# Patient Record
Sex: Female | Born: 1940
Health system: Southern US, Community
[De-identification: ages and names within clinical notes are randomized; demographics above are authoritative.]

## PROBLEM LIST (undated history)

## (undated) DIAGNOSIS — Z85828 Personal history of other malignant neoplasm of skin: Secondary | ICD-10-CM

## (undated) DIAGNOSIS — E785 Hyperlipidemia, unspecified: Secondary | ICD-10-CM

## (undated) DIAGNOSIS — U071 COVID-19: Secondary | ICD-10-CM

## (undated) DIAGNOSIS — K219 Gastro-esophageal reflux disease without esophagitis: Secondary | ICD-10-CM

## (undated) DIAGNOSIS — K649 Unspecified hemorrhoids: Secondary | ICD-10-CM

## (undated) DIAGNOSIS — K449 Diaphragmatic hernia without obstruction or gangrene: Secondary | ICD-10-CM

## (undated) DIAGNOSIS — J189 Pneumonia, unspecified organism: Secondary | ICD-10-CM

## (undated) DIAGNOSIS — L719 Rosacea, unspecified: Secondary | ICD-10-CM

## (undated) DIAGNOSIS — G4700 Insomnia, unspecified: Secondary | ICD-10-CM

## (undated) DIAGNOSIS — I10 Essential (primary) hypertension: Secondary | ICD-10-CM

## (undated) DIAGNOSIS — I2510 Atherosclerotic heart disease of native coronary artery without angina pectoris: Secondary | ICD-10-CM

## (undated) DIAGNOSIS — F419 Anxiety disorder, unspecified: Secondary | ICD-10-CM

## (undated) DIAGNOSIS — C801 Malignant (primary) neoplasm, unspecified: Secondary | ICD-10-CM

## (undated) DIAGNOSIS — R011 Cardiac murmur, unspecified: Secondary | ICD-10-CM

## (undated) DIAGNOSIS — K760 Fatty (change of) liver, not elsewhere classified: Secondary | ICD-10-CM

## (undated) HISTORY — DX: Malignant (primary) neoplasm, unspecified: C80.1

## (undated) HISTORY — DX: Diaphragmatic hernia without obstruction or gangrene: K44.9

## (undated) HISTORY — DX: Anxiety disorder, unspecified: F41.9

## (undated) HISTORY — DX: Fatty (change of) liver, not elsewhere classified: K76.0

## (undated) HISTORY — DX: Essential (primary) hypertension: I10

## (undated) HISTORY — PX: BASAL CELL CARCINOMA EXCISION: SHX1214

## (undated) HISTORY — DX: Personal history of other malignant neoplasm of skin: Z85.828

## (undated) HISTORY — DX: Cardiac murmur, unspecified: R01.1

## (undated) HISTORY — DX: Hyperlipidemia, unspecified: E78.5

## (undated) HISTORY — DX: Gastro-esophageal reflux disease without esophagitis: K21.9

## (undated) HISTORY — PX: MOHS SURGERY: SUR867

## (undated) HISTORY — DX: COVID-19: U07.1

## (undated) HISTORY — DX: Insomnia, unspecified: G47.00

## (undated) HISTORY — DX: Atherosclerotic heart disease of native coronary artery without angina pectoris: I25.10

## (undated) HISTORY — DX: Unspecified hemorrhoids: K64.9

## (undated) HISTORY — DX: Rosacea, unspecified: L71.9

## (undated) HISTORY — PX: APPENDECTOMY: SHX54

---

## 1979-08-29 HISTORY — PX: TUBAL LIGATION: SHX77

## 2005-09-18 ENCOUNTER — Ambulatory Visit: Payer: Self-pay | Admitting: Gastroenterology

## 2005-09-28 LAB — HM COLONOSCOPY

## 2005-10-06 ENCOUNTER — Ambulatory Visit: Payer: Self-pay | Admitting: Gastroenterology

## 2005-10-06 ENCOUNTER — Encounter (INDEPENDENT_AMBULATORY_CARE_PROVIDER_SITE_OTHER): Payer: Self-pay | Admitting: Specialist

## 2007-10-08 LAB — HM PAP SMEAR: HM Pap smear: NORMAL

## 2009-08-28 LAB — HM MAMMOGRAPHY: HM Mammogram: NORMAL

## 2009-08-28 LAB — HM DEXA SCAN

## 2010-04-08 ENCOUNTER — Encounter: Admission: RE | Admit: 2010-04-08 | Discharge: 2010-04-08 | Payer: Self-pay | Admitting: Family Medicine

## 2011-01-18 ENCOUNTER — Encounter: Payer: Self-pay | Admitting: Gastroenterology

## 2011-01-18 ENCOUNTER — Ambulatory Visit (INDEPENDENT_AMBULATORY_CARE_PROVIDER_SITE_OTHER): Payer: Medicare Other | Admitting: Gastroenterology

## 2011-01-18 VITALS — BP 128/64 | HR 60 | Ht 63.0 in | Wt 181.0 lb

## 2011-01-18 DIAGNOSIS — R102 Pelvic and perineal pain: Secondary | ICD-10-CM

## 2011-01-18 DIAGNOSIS — N949 Unspecified condition associated with female genital organs and menstrual cycle: Secondary | ICD-10-CM

## 2011-01-18 NOTE — Progress Notes (Signed)
Review of pertinent gastrointestinal problems: 1. routine risk for colon cancer. Colonoscopy by Dr. Christella Hartigan 2007 with small hyperplastic polyps removed. Recall colonoscopy at 10 year interval.   HPI: This is a  very pleasant 70 year old woman whom I last saw at the time of her screening colonoscopy 5 years ago.  She has been . Has has been having left pelvis swelling, pain.  She moves and hears a "squishy feeling" in her left abd.  Can have trouble with constipation, trouble with urinating. This is intermittent.  Will change her diet to liquids, and symptoms improve.  In August 2011 she had these symptoms, no fevers or chills.  Felt "bad" "so bad,"  During these symptoms her symptoms can be black.  Does not take peptobismal.   In August 2011 she was put on antibiotics for "diverticulitis" however no tics ever seen on colonoscopy or by CT scan done at that time.  The pain is clearly positional, can be related to hip movement. She sometimes feels swelling in the left pelvis. She can hear a whooshing sound at times during the pain.  Overall stable weight.  No overt red bleeding, but + dark stools.  No FH of colon cancer (no first degree relatives at least).    Review of systems: Pertinent positive and negative review of systems were noted in the above HPI section.  All other review of systems was otherwise negative.   Past Medical History, Past Surgical History, Family History, Social History, Current Medications, Allergies were all reviewed with the patient via [facility] electronic medical record system.   Physical Exam: BP 128/64  Pulse 60  Ht 5\' 3"  (1.6 m)  Wt 181 lb (82.101 kg)  BMI 32.06 kg/m2 Constitutional: generally well-appearing Psychiatric: alert and oriented x3 Eyes: extraocular movements intact Mouth: oral pharynx moist, no lesions Neck: supple no lymphadenopathy Cardiovascular: heart regular rate and rhythm Lungs: clear to auscultation bilaterally Abdomen: soft,  nontender, nondistended, no obvious ascites, no peritoneal signs, normal bowel sounds Extremities: no lower extremity edema bilaterally Skin: no lesions on visible extremities She is very mildly tender in her left pelvis, I do not detect any clear hernia defects.    Assessment and plan: 70 y.o. female with left pelvic discomfort  I do not think that her symptoms are bowel, diverticular related. She had no diverticulosis noted on CT scan 2011 and no diverticulosis noted on colonoscopy 2007. Her pain is actually low in her left pelvis, clearly positional related. I did not take the hernia at that site but this certainly could be a small hernia. I am going to send her to Gen. surgery to evaluate for left-sided inguinal hernia. Perhaps it is just too small for me to detect. If that evaluation is negative then I suggest further musculoskeletal workup putting hip x-rays, perhaps spine x-ray.

## 2011-01-18 NOTE — Patient Instructions (Signed)
Referral to general surgery for low left abd pain, ?hernia. If no hernia detected, then should consider left hip, spine problem. No diverticulosis seen on 2007 colonoscopy or 2011 CT scan abd/pelvis. A copy of this information will be made available to Dr. Tanya Nones.

## 2011-01-24 ENCOUNTER — Encounter: Payer: Self-pay | Admitting: Gastroenterology

## 2011-02-20 ENCOUNTER — Encounter (INDEPENDENT_AMBULATORY_CARE_PROVIDER_SITE_OTHER): Payer: Self-pay | Admitting: General Surgery

## 2011-02-20 ENCOUNTER — Ambulatory Visit (INDEPENDENT_AMBULATORY_CARE_PROVIDER_SITE_OTHER): Payer: Self-pay | Admitting: General Surgery

## 2011-02-20 ENCOUNTER — Ambulatory Visit (INDEPENDENT_AMBULATORY_CARE_PROVIDER_SITE_OTHER): Payer: Medicare Other | Admitting: General Surgery

## 2011-02-20 VITALS — BP 130/62 | HR 78 | Ht 63.0 in | Wt 180.0 lb

## 2011-02-20 DIAGNOSIS — R1032 Left lower quadrant pain: Secondary | ICD-10-CM | POA: Insufficient documentation

## 2011-02-20 HISTORY — DX: Left lower quadrant pain: R10.32

## 2011-02-20 NOTE — Progress Notes (Signed)
Subjective:     Patient ID: Sharon Mcclure, female   DOB: [birth year], 70 y.o.   MRN: [id number]    There were no vitals taken for this visit.    HPIThis is a 70 year old female who is referred by Dr. Wendall Papa. She has about a 2 year history of what she describes as a left lower quadrant pain and discomfort that is always there. She works as a Interior and spatial designer. She  notes that this is worse with bending and lifting. It is not made much worse by activity. He has never they are in beginning of the morning and it always occurs after she has activity. She reports that she feels a gushing sound associated with this. She does not note any bulge in this area at all. She reports no changes in her bowel movements. She reports no nausea or vomiting associated with this.   Review of Systems  Constitutional: Negative.   Respiratory: Negative.   Cardiovascular: Negative.   Gastrointestinal: Negative.   Musculoskeletal: Positive for arthralgias (left hip).       Objective:   Physical Exam  Constitutional: She appears well-developed and well-nourished. No distress.  Cardiovascular: Normal rate, regular rhythm and normal heart sounds.   Pulmonary/Chest: Effort normal and breath sounds normal. She has no wheezes.  Abdominal: Soft. There is no tenderness.       She has well healed small midline incision from tubal ligation and a right lower quadrant scar.  She is nontender and has no evidence of any abdominal wall hernia or inguinal hernia on exam.  Examined her both standing and with Valsalva.       Assessment:     Left lower quadrant abdominal pain    Plan:     After examining in talking with the patient about her left lower quadrant abdominal pain I do not think she has a hernia that is the cause of his pain. She has a normal colonoscopy in 2007 and has a CT scan in 2011 which I have reviewed which is essentially normal as well. I do not palpate a hernia as the source of her pain on her examination.  I'm going to have her see a gynecologist that she does report that she had an abnormal pelvic exam in the past which is what may be the source of her pain. Am also going to have her go back to see Dr. Wendall Papa following this visit as well. I asked her to come back and see me for any questions or any problems referable to this we discussed some of the symptoms associated with the hernia.

## 2011-03-08 ENCOUNTER — Encounter: Payer: Self-pay | Admitting: Family Medicine

## 2011-03-08 DIAGNOSIS — Z85828 Personal history of other malignant neoplasm of skin: Secondary | ICD-10-CM | POA: Insufficient documentation

## 2011-03-08 DIAGNOSIS — E782 Mixed hyperlipidemia: Secondary | ICD-10-CM | POA: Insufficient documentation

## 2011-03-08 DIAGNOSIS — F419 Anxiety disorder, unspecified: Secondary | ICD-10-CM | POA: Insufficient documentation

## 2011-03-08 DIAGNOSIS — E785 Hyperlipidemia, unspecified: Secondary | ICD-10-CM | POA: Insufficient documentation

## 2011-03-08 DIAGNOSIS — E559 Vitamin D deficiency, unspecified: Secondary | ICD-10-CM | POA: Insufficient documentation

## 2011-03-08 DIAGNOSIS — I10 Essential (primary) hypertension: Secondary | ICD-10-CM | POA: Insufficient documentation

## 2012-02-25 ENCOUNTER — Emergency Department: Payer: Self-pay | Admitting: Emergency Medicine

## 2012-04-01 ENCOUNTER — Emergency Department: Payer: Self-pay | Admitting: Emergency Medicine

## 2012-04-01 LAB — CBC
HCT: 42.1 % (ref 35.0–47.0)
HGB: 14.6 g/dL (ref 12.0–16.0)
MCH: 30.5 pg (ref 26.0–34.0)
MCHC: 34.7 g/dL (ref 32.0–36.0)
MCV: 88 fL (ref 80–100)
Platelet: 229 10*3/uL (ref 150–440)
RBC: 4.8 10*6/uL (ref 3.80–5.20)
RDW: 13.8 % (ref 11.5–14.5)
WBC: 9.3 10*3/uL (ref 3.6–11.0)

## 2012-04-01 LAB — URINALYSIS, COMPLETE
Bacteria: NONE SEEN
Bilirubin,UR: NEGATIVE
Glucose,UR: NEGATIVE mg/dL (ref 0–75)
Ketone: NEGATIVE
Nitrite: NEGATIVE
Ph: 6 (ref 4.5–8.0)
Protein: NEGATIVE
RBC,UR: 2 /HPF (ref 0–5)
Specific Gravity: 1.006 (ref 1.003–1.030)
Squamous Epithelial: 1
WBC UR: 37 /HPF (ref 0–5)

## 2012-04-01 LAB — COMPREHENSIVE METABOLIC PANEL
Albumin: 3.9 g/dL (ref 3.4–5.0)
Alkaline Phosphatase: 78 U/L (ref 50–136)
Anion Gap: 7 (ref 7–16)
BUN: 22 mg/dL — ABNORMAL HIGH (ref 7–18)
Bilirubin,Total: 0.4 mg/dL (ref 0.2–1.0)
Calcium, Total: 9.4 mg/dL (ref 8.5–10.1)
Chloride: 102 mmol/L (ref 98–107)
Co2: 31 mmol/L (ref 21–32)
Creatinine: 0.8 mg/dL (ref 0.60–1.30)
EGFR (African American): >60
EGFR (Non-African Amer.): >60
Glucose: 95 mg/dL (ref 65–99)
Osmolality: 283 (ref 275–301)
Potassium: 3.6 mmol/L (ref 3.5–5.1)
SGOT(AST): 37 U/L (ref 15–37)
SGPT (ALT): 36 U/L (ref 12–78)
Sodium: 140 mmol/L (ref 136–145)
Total Protein: 7.4 g/dL (ref 6.4–8.2)

## 2012-04-01 LAB — LIPASE, BLOOD: Lipase: 128 U/L (ref 73–393)

## 2012-10-21 ENCOUNTER — Encounter: Payer: Self-pay | Admitting: Physician Assistant

## 2012-10-21 ENCOUNTER — Encounter: Payer: Self-pay | Admitting: *Deleted

## 2012-10-21 DIAGNOSIS — K219 Gastro-esophageal reflux disease without esophagitis: Secondary | ICD-10-CM | POA: Insufficient documentation

## 2013-03-03 ENCOUNTER — Telehealth: Payer: Self-pay | Admitting: Family Medicine

## 2013-03-03 MED ORDER — ZOLPIDEM TARTRATE 10 MG PO TABS: 10.0000 mg | ORAL_TABLET | Freq: Every evening | ORAL | Status: DC | PRN

## 2013-03-03 NOTE — Telephone Encounter (Signed)
?   OK to Refill  

## 2013-03-03 NOTE — Telephone Encounter (Signed)
Ok to refill with 3 refills.  

## 2013-03-03 NOTE — Telephone Encounter (Signed)
Rx Refilled  

## 2013-07-12 ENCOUNTER — Other Ambulatory Visit: Payer: Self-pay | Admitting: Physician Assistant

## 2013-08-11 ENCOUNTER — Other Ambulatory Visit: Payer: Self-pay | Admitting: Physician Assistant

## 2013-09-08 ENCOUNTER — Other Ambulatory Visit: Payer: Self-pay | Admitting: Family Medicine

## 2013-09-08 ENCOUNTER — Other Ambulatory Visit: Payer: Self-pay | Admitting: Physician Assistant

## 2013-09-08 ENCOUNTER — Encounter: Payer: Self-pay | Admitting: Family Medicine

## 2013-09-08 NOTE — Telephone Encounter (Signed)
Medication refill for one time only.  Patient needs to be seen.  Letter sent for patient to call and schedule 

## 2013-09-29 ENCOUNTER — Ambulatory Visit (INDEPENDENT_AMBULATORY_CARE_PROVIDER_SITE_OTHER): Payer: Medicare Other | Admitting: Physician Assistant

## 2013-09-29 ENCOUNTER — Encounter: Payer: Self-pay | Admitting: Physician Assistant

## 2013-09-29 VITALS — BP 116/68 | HR 64 | Temp 97.2°F | Resp 18 | Ht 59.75 in | Wt 181.0 lb

## 2013-09-29 DIAGNOSIS — E559 Vitamin D deficiency, unspecified: Secondary | ICD-10-CM | POA: Insufficient documentation

## 2013-09-29 DIAGNOSIS — F419 Anxiety disorder, unspecified: Secondary | ICD-10-CM

## 2013-09-29 DIAGNOSIS — I10 Essential (primary) hypertension: Secondary | ICD-10-CM

## 2013-09-29 DIAGNOSIS — F411 Generalized anxiety disorder: Secondary | ICD-10-CM

## 2013-09-29 DIAGNOSIS — K219 Gastro-esophageal reflux disease without esophagitis: Secondary | ICD-10-CM

## 2013-09-29 DIAGNOSIS — E785 Hyperlipidemia, unspecified: Secondary | ICD-10-CM

## 2013-09-29 HISTORY — DX: Vitamin D deficiency, unspecified: E55.9

## 2013-09-29 LAB — COMPLETE METABOLIC PANEL WITH GFR
ALT: 24 U/L (ref 0–35)
AST: 25 U/L (ref 0–37)
Albumin: 4.3 g/dL (ref 3.5–5.2)
Alkaline Phosphatase: 60 U/L (ref 39–117)
BUN: 18 mg/dL (ref 6–23)
CO2: 28 mEq/L (ref 19–32)
Calcium: 9.9 mg/dL (ref 8.4–10.5)
Chloride: 99 mEq/L (ref 96–112)
Creat: 0.76 mg/dL (ref 0.50–1.10)
GFR, Est African American: >89 mL/min
GFR, Est Non African American: 79 mL/min
Glucose, Bld: 93 mg/dL (ref 70–99)
Potassium: 4.1 mEq/L (ref 3.5–5.3)
Sodium: 139 mEq/L (ref 135–145)
Total Bilirubin: 0.5 mg/dL (ref 0.2–1.2)
Total Protein: 6.5 g/dL (ref 6.0–8.3)

## 2013-09-29 NOTE — Progress Notes (Signed)
Patient ID: Sharon Mcclure MRN: [id number], DOB: 1941/08/06, 73 y.o. Date of Encounter: @DATE @  Chief Complaint:  Chief Complaint  Patient presents with  . 6 mth check up    is fasting    HPI: 73 y.o. year old white female  presents for routine followup visit.  Anxiety: She is on Celexa 40 mg. She says that this continues to work very well to control her anxiety. Has no adverse effects.  Insomnia: She continues to take Ambien 5 mg every night (one half of the 10 mg)--this works perfectly. She is able to sleep very well with this and she has no side effects and no grogginess in the morning. Says that any medicine with Benadryl in it including any of the "PMs" makes her drowsy but also makes her restless.  Still taking HCTZ for hypertension. No lightheadedness or other adverse effects.   GERD: States that she still has to take the omeprazole every day or else she has a lot of heartburn and acid reflux. However with the medication the symptoms are fairly controlled.  She is still taking 1000 units daily of vitamin D.   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 When She does any physical exertion, she has no chest pressure heaviness or tightness and no increased shortness of breath/dyspnea on exertion.     Past Medical History  Diagnosis Date  . Insomnia   . Fatty liver   . Vitamin D deficiency   . History of skin cancer     Right arm  . Anxiety   . Hyperlipidemia   . HTN (hypertension)      Home Meds: See attached medication section for current medication list. Any medications entered into computer today will not appear on this note's list. The medications listed below were entered prior to today. Current Outpatient Prescriptions on File Prior to Visit  Medication Sig Dispense Refill  . aspirin 81 MG tablet Take 81 mg by mouth daily.        . Calcium Carbonate-Vit D-Min (CALCIUM 1200 PO) Take by mouth daily.        . citalopram (CELEXA) 40  MG tablet TAKE ONE TABLET BY MOUTH ONCE DAILY  30 tablet  0  . hydrochlorothiazide (HYDRODIURIL) 25 MG tablet TAKE ONE TABLET BY MOUTH ONCE DAILY  30 tablet  0  . Omega-3 Fatty Acids (FISH OIL) 1000 MG CAPS Take by mouth daily.        Marland Kitchen omeprazole (PRILOSEC) 40 MG capsule Take 40 mg by mouth daily.        Marland Kitchen zolpidem (AMBIEN) 10 MG tablet Take 1 tablet (10 mg total) by mouth at bedtime as needed for sleep.  30 tablet  3   No current facility-administered medications on file prior to visit.    Allergies:  Allergies  Allergen Reactions  . Ampicillin Hives, Shortness Of Breath and Swelling  . Penicillins Hives and Shortness Of Breath  . Crestor [Rosuvastatin Calcium]     Burning sensation  . Ezetimibe-Simvastatin   . Niaspan [Niacin Er]     Fatigues, aches.  . Vytorin [Ezetimibe-Simvastatin]   . Zetia [Ezetimibe]   . Zocor [Simvastatin]   . Welchol [Colesevelam Hcl] Other (See Comments)    Severe bloating even on low dose    History   Social History  . Marital Status: Married    Spouse Name: N/A    Number of Children: N/A  . Years of  Education: N/A   Occupational History  . Not on file.   Social History Main Topics  . Smoking status: Former Smoker    Types: Cigarettes  . Smokeless tobacco: Never Used  . Alcohol Use: No  . Drug Use: No  . Sexual Activity: Not on file   Other Topics Concern  . Not on file   Social History Narrative  . No narrative on file    Family History  Problem Relation Age of Onset  . Colon cancer Neg Hx   . Heart disease Mother 65    aortic valve- had AVR- died 6 wks after  . Aneurysm Father 13    aortic aneursym  . Heart disease Brother 22    CABG     Review of Systems:  See HPI for pertinent ROS. All other ROS negative.    Physical Exam: Blood pressure 116/68, pulse 64, temperature 97.2 F (36.2 C), temperature source Oral, resp. rate 18, height 4' 11.75" (1.518 m), weight 181 lb (82.101 kg)., Body mass index is 35.63  kg/(m^2). General: Obese WF. Appears in no acute distress. Neck: Supple. No thyromegaly. No lymphadenopathy. no carotid bruits . Lungs: Clear bilaterally to auscultation without wheezes, rales, or rhonchi. Breathing is unlabored. Heart: RRR with S1 S2. No murmurs, rubs, or gallops. Abdomen: Soft, non-tender, non-distended with normoactive bowel sounds. No hepatomegaly. No rebound/guarding. No obvious abdominal masses. Musculoskeletal:  Strength and tone normal for age. Extremities/Skin: Warm and dry. No edema. No rashes or suspicious lesions. Neuro: Alert and oriented X 3. Moves all extremities spontaneously. Gait is normal. CNII-XII grossly in tact. Psych:  Responds to questions appropriately with a normal affect.     ASSESSMENT AND PLAN:  73 y.o. year old female with  1. Anxiety Well Controlled. Continue Celexa 40 mg.  - COMPLETE METABOLIC PANEL WITH GFR  2. GERD (gastroesophageal reflux disease) Controlled with omeprazole 40 mg daily.  - COMPLETE METABOLIC PANEL WITH GFR  3. HTN (hypertension) Blood Pressure is at goal. Continue HCTZ 25 mg daily. Check labs monitor.  - COMPLETE METABOLIC PANEL WITH GFR  4. Hyperlipidemia She has been intolerant to every medication. History of adverse affects of all statins, WelChol, Zetia. Will not recheck lipid panel as we are not going to change  therapy regardless. She is well aware of dietary changes needed. She is well aware of severe hyperlipidemia. Last LDL was 210 in February 2014  - COMPLETE METABOLIC PANEL WITH GFR  5. Vitamin D deficiency She verifies that she is still taking 1000 units daily.  - COMPLETE METABOLIC PANEL WITH GFR - Vit D  25 hydroxy (rtn osteoporosis monitoring)  6. colonoscopy: February 2007. Repeat 10 years.   7. Screening Mammogram: She states that she just had this done in January 2015. Normal.  8. DEXA 09/16/2009. Normal. She is on calcium vitamin D and exercise.  5. family history of AAA: She had  ultrasound of March 2009 which showed no AAA  ROV 6 months, sooner if needed.   Signed, [address] [place], [place], [facility] 09/29/2013 10:16 AM

## 2013-09-30 ENCOUNTER — Encounter: Payer: Self-pay | Admitting: Family Medicine

## 2013-09-30 LAB — VITAMIN D 25 HYDROXY (VIT D DEFICIENCY, FRACTURES): Vit D, 25-Hydroxy: 76 ng/mL (ref 30–89)

## 2013-10-06 ENCOUNTER — Telehealth: Payer: Self-pay | Admitting: Family Medicine

## 2013-10-06 NOTE — Telephone Encounter (Signed)
Pt is wanting something called in for her Congestion (head and ears), and coughing, she does not take anything in the cillin family she states!  Call back number is [phone] Pharmacy is [facility]

## 2013-10-07 MED ORDER — AZITHROMYCIN 250 MG PO TABS: ORAL_TABLET | ORAL | Status: DC

## 2013-10-07 NOTE — Telephone Encounter (Signed)
Can send prescription for azithromycin 250 mg    Day 1 take 2 daily.  days 2 through 5 take one daily. #6 (1 pack) 0 refill

## 2013-10-07 NOTE — Telephone Encounter (Signed)
Pt called and made aware of RX.  Told if still not better when finished we will need to see.

## 2013-10-19 ENCOUNTER — Other Ambulatory Visit: Payer: Self-pay | Admitting: Physician Assistant

## 2013-10-19 ENCOUNTER — Other Ambulatory Visit: Payer: Self-pay | Admitting: Family Medicine

## 2013-10-19 DIAGNOSIS — F419 Anxiety disorder, unspecified: Secondary | ICD-10-CM

## 2013-10-19 DIAGNOSIS — K219 Gastro-esophageal reflux disease without esophagitis: Secondary | ICD-10-CM

## 2013-10-20 NOTE — Telephone Encounter (Signed)
Medication refilled per protocol. 

## 2013-11-24 ENCOUNTER — Encounter: Payer: Self-pay | Admitting: Physician Assistant

## 2013-11-24 ENCOUNTER — Ambulatory Visit (INDEPENDENT_AMBULATORY_CARE_PROVIDER_SITE_OTHER): Payer: Medicare Other | Admitting: Physician Assistant

## 2013-11-24 VITALS — BP 124/84 | HR 64 | Temp 98.4°F | Resp 18 | Ht 60.25 in | Wt 186.0 lb

## 2013-11-24 DIAGNOSIS — L719 Rosacea, unspecified: Secondary | ICD-10-CM

## 2013-11-24 DIAGNOSIS — J069 Acute upper respiratory infection, unspecified: Secondary | ICD-10-CM

## 2013-11-24 DIAGNOSIS — B9689 Other specified bacterial agents as the cause of diseases classified elsewhere: Secondary | ICD-10-CM

## 2013-11-24 DIAGNOSIS — A499 Bacterial infection, unspecified: Secondary | ICD-10-CM

## 2013-11-24 HISTORY — DX: Rosacea, unspecified: L71.9

## 2013-11-24 MED ORDER — AZITHROMYCIN 250 MG PO TABS: ORAL_TABLET | ORAL | Status: DC

## 2013-11-24 MED ORDER — AZELAIC ACID 15 % EX GEL: CUTANEOUS | Status: DC

## 2013-11-24 MED ORDER — FLUTICASONE PROPIONATE 50 MCG/ACT NA SUSP: 2.0000 | Freq: Every day | NASAL | Status: DC

## 2013-11-24 NOTE — Progress Notes (Signed)
Patient ID: Sharon Mcclure MRN: [id number], DOB: 1941-01-12, 73 y.o. Date of Encounter: 11/24/2013, 11:36 AM    Chief Complaint:  Chief Complaint  Patient presents with  . right ear ache     HPI: 73 y.o. year old white female reports that her right ear has been bothering her for the past 2 or 3 days. Says that it does not hurt all the time but intermittently feels a discomfort deep inside the right ear. The pain sometimes radiates down towards her right throat. Says that the pain did bother her while sleeping night before last. Says that she did not wake up with any pain in the ear last night but does state that she took Motrin prior to going to bed last night. Says that she often feels pressure and congestion in her right cheek area in the sinuses. Also the right side of her nose is stopped up and congested compared to the left. However she has not been able to get any mucus from her nose. Has very little dry hacking cough. No cough with any phlegm. Has had no known fevers or chills.     Home Meds: See attached medication section for any medications that were entered at today's visit. The computer does not put those onto this list.The following list is a list of meds entered prior to today's visit.   Current Outpatient Prescriptions on File Prior to Visit  Medication Sig Dispense Refill  . aspirin 81 MG tablet Take 81 mg by mouth daily.        . Calcium Carbonate-Vit D-Min (CALCIUM 1200 PO) Take by mouth daily.        . citalopram (CELEXA) 40 MG tablet TAKE ONE TABLET BY MOUTH ONCE DAILY  30 tablet  0  . hydrochlorothiazide (HYDRODIURIL) 25 MG tablet Take 1 tablet (25 mg total) by mouth daily.  30 tablet  5  . Omega-3 Fatty Acids (FISH OIL) 1000 MG CAPS Take by mouth daily.        Marland Kitchen omeprazole (PRILOSEC) 20 MG capsule TAKE ONE CAPSULE BY MOUTH ONCE DAILY  30 capsule  6   No current facility-administered medications on file prior to visit.    Allergies:  Allergies  Allergen  Reactions  . Ampicillin Hives, Shortness Of Breath and Swelling  . Penicillins Hives and Shortness Of Breath  . Crestor [Rosuvastatin Calcium]     Burning sensation  . Ezetimibe-Simvastatin   . Niaspan [Niacin Er]     Fatigues, aches.  . Vytorin [Ezetimibe-Simvastatin]   . Zetia [Ezetimibe]   . Zocor [Simvastatin]   . Welchol [Colesevelam Hcl] Other (See Comments)    Severe bloating even on low dose      Review of Systems: See HPI for pertinent ROS. All other ROS negative.    Physical Exam: Blood pressure 124/84, pulse 64, temperature 98.4 F (36.9 C), temperature source Oral, resp. rate 18, height 5' 0.25" (1.53 m), weight 186 lb (84.369 kg)., Body mass index is 36.04 kg/(m^2). General:WNWD WF.   Appears in no acute distress. HEENT: Normocephalic, atraumatic, eyes without discharge, sclera non-icteric, nares are without discharge. Bilateral auditory canals clear, TM's are without perforation, pearly grey and translucent with reflective cone of light bilaterally. Oral cavity moist, posterior pharynx without exudate, erythema, peritonsillar abscess, or post nasal drip. Minimal tenderness with percussion of the right maxillary sinus. No tenderness with percussion of the left maxillary sinus or the frontal sinuses. Minimal tenderness with the TM joint.  Neck: Supple. No  thyromegaly. No lymphadenopathy. Lungs: Clear bilaterally to auscultation without wheezes, rales, or rhonchi. Breathing is unlabored. Heart: Regular rhythm. No murmurs, rubs, or gallops. Msk:  Strength and tone normal for age. Extremities/Skin: Warm and dry.  No rashes . Neuro: Alert and oriented X 3. Moves all extremities spontaneously. Gait is normal. CNII-XII grossly in tact. Psych:  Responds to questions appropriately with a normal affect.     ASSESSMENT AND PLAN:  73 y.o. year old female with  1. Bacterial upper respiratory infection She has history of severe allergy to ampicillin and penicillin. Therefore will  avoid Augmentin. Follow up if symptoms do not resolve after completion of antibiotic. - azithromycin (ZITHROMAX) 250 MG tablet; Day 1: Take 2 daily.  Days 2-5: Take 1 daily.  Dispense: 6 tablet; Refill: 0 - fluticasone (FLONASE) 50 MCG/ACT nasal spray; Place 2 sprays into both nostrils daily.  Dispense: 16 g; Refill: 6  2. Rosacea She brings in an old prescription box in which her Azelaic cream had come in- in the past. She is requesting that I refill this. She states that the dermatologist has been prescribing this for years for rosacea. - Azelaic Acid 15 % cream; After skin is thoroughly washed and patted dry, gently but thoroughly massage a thin film of azelaic acid cream into the affected area twice daily, in the morning and evening.  Dispense: 50 g; Refill: [address] [place], [place], [facility] 11/24/2013 11:36 AM

## 2013-11-26 ENCOUNTER — Telehealth: Payer: Self-pay | Admitting: Family Medicine

## 2013-11-26 DIAGNOSIS — L719 Rosacea, unspecified: Secondary | ICD-10-CM

## 2013-11-26 MED ORDER — AZELAIC ACID 20 % EX CREA: TOPICAL_CREAM | Freq: Two times a day (BID) | CUTANEOUS | Status: DC

## 2013-11-26 NOTE — Telephone Encounter (Signed)
Pharmacy needs RX for Azelaic acid topical cream for 20%.  15% not available

## 2014-01-12 ENCOUNTER — Telehealth: Payer: Self-pay | Admitting: Family Medicine

## 2014-01-12 MED ORDER — BENZOYL PEROXIDE-ERYTHROMYCIN 5-3 % EX GEL: Freq: Two times a day (BID) | CUTANEOUS | Status: DC

## 2014-01-12 NOTE — Telephone Encounter (Signed)
New Rx to pharmacy

## 2014-01-12 NOTE — Telephone Encounter (Signed)
Approved. Can order medicine with " apply to affected areas twice a day"   dispense #45 g with 6 refills

## 2014-01-12 NOTE — Telephone Encounter (Signed)
Patient would like rx for benzolyl erthromycin if possible she said we prescribed something for her but it was too expensive so insurance recommended this instead   [phone] pharmacy is [facility] [address]

## 2014-03-24 ENCOUNTER — Other Ambulatory Visit: Payer: Self-pay | Admitting: Family Medicine

## 2014-03-24 DIAGNOSIS — Z79899 Other long term (current) drug therapy: Secondary | ICD-10-CM

## 2014-03-24 DIAGNOSIS — I10 Essential (primary) hypertension: Secondary | ICD-10-CM

## 2014-03-24 DIAGNOSIS — E785 Hyperlipidemia, unspecified: Secondary | ICD-10-CM

## 2014-03-24 DIAGNOSIS — E559 Vitamin D deficiency, unspecified: Secondary | ICD-10-CM

## 2014-03-24 DIAGNOSIS — Z0000 Encounter for general adult medical examination without abnormal findings: Secondary | ICD-10-CM

## 2014-03-30 ENCOUNTER — Ambulatory Visit: Payer: Medicare Other | Admitting: Physician Assistant

## 2014-04-15 ENCOUNTER — Other Ambulatory Visit: Payer: Medicare Other

## 2014-04-15 DIAGNOSIS — Z0000 Encounter for general adult medical examination without abnormal findings: Secondary | ICD-10-CM

## 2014-04-15 DIAGNOSIS — Z79899 Other long term (current) drug therapy: Secondary | ICD-10-CM

## 2014-04-15 DIAGNOSIS — E785 Hyperlipidemia, unspecified: Secondary | ICD-10-CM

## 2014-04-15 DIAGNOSIS — I10 Essential (primary) hypertension: Secondary | ICD-10-CM

## 2014-04-15 DIAGNOSIS — E559 Vitamin D deficiency, unspecified: Secondary | ICD-10-CM

## 2014-04-15 LAB — COMPLETE METABOLIC PANEL WITH GFR
ALT: 27 U/L (ref 0–35)
AST: 29 U/L (ref 0–37)
Albumin: 4 g/dL (ref 3.5–5.2)
Alkaline Phosphatase: 53 U/L (ref 39–117)
BUN: 16 mg/dL (ref 6–23)
CO2: 29 mEq/L (ref 19–32)
Calcium: 9.3 mg/dL (ref 8.4–10.5)
Chloride: 101 mEq/L (ref 96–112)
Creat: 0.81 mg/dL (ref 0.50–1.10)
GFR, Est African American: 85 mL/min
GFR, Est Non African American: 73 mL/min
Glucose, Bld: 102 mg/dL — ABNORMAL HIGH (ref 70–99)
Potassium: 4.3 mEq/L (ref 3.5–5.3)
Sodium: 141 mEq/L (ref 135–145)
Total Bilirubin: 0.7 mg/dL (ref 0.2–1.2)
Total Protein: 6.8 g/dL (ref 6.0–8.3)

## 2014-04-15 LAB — CBC WITH DIFFERENTIAL/PLATELET
Basophils Absolute: 0.1 10*3/uL (ref 0.0–0.1)
Basophils Relative: 1 % (ref 0–1)
Eosinophils Absolute: 0.2 10*3/uL (ref 0.0–0.7)
Eosinophils Relative: 4 % (ref 0–5)
HCT: 42.6 % (ref 36.0–46.0)
Hemoglobin: 14.6 g/dL (ref 12.0–15.0)
Lymphocytes Relative: 35 % (ref 12–46)
Lymphs Abs: 2.2 10*3/uL (ref 0.7–4.0)
MCH: 29.5 pg (ref 26.0–34.0)
MCHC: 34.3 g/dL (ref 30.0–36.0)
MCV: 86.1 fL (ref 78.0–100.0)
Monocytes Absolute: 0.6 10*3/uL (ref 0.1–1.0)
Monocytes Relative: 9 % (ref 3–12)
Neutro Abs: 3.2 10*3/uL (ref 1.7–7.7)
Neutrophils Relative %: 51 % (ref 43–77)
Platelets: 246 10*3/uL (ref 150–400)
RBC: 4.95 MIL/uL (ref 3.87–5.11)
RDW: 14 % (ref 11.5–15.5)
WBC: 6.2 10*3/uL (ref 4.0–10.5)

## 2014-04-15 LAB — LIPID PANEL
Cholesterol: 280 mg/dL — ABNORMAL HIGH (ref 0–200)
HDL: 48 mg/dL (ref >39)
LDL Cholesterol: 191 mg/dL — ABNORMAL HIGH (ref 0–99)
Total CHOL/HDL Ratio: 5.8 Ratio
Triglycerides: 205 mg/dL — ABNORMAL HIGH (ref <150)
VLDL: 41 mg/dL — ABNORMAL HIGH (ref 0–40)

## 2014-04-15 LAB — TSH: TSH: 2.406 u[IU]/mL (ref 0.350–4.500)

## 2014-04-16 LAB — VITAMIN D 25 HYDROXY (VIT D DEFICIENCY, FRACTURES): Vit D, 25-Hydroxy: 82 ng/mL (ref 30–89)

## 2014-04-22 ENCOUNTER — Ambulatory Visit (INDEPENDENT_AMBULATORY_CARE_PROVIDER_SITE_OTHER): Payer: Medicare Other | Admitting: Physician Assistant

## 2014-04-22 ENCOUNTER — Encounter: Payer: Self-pay | Admitting: Physician Assistant

## 2014-04-22 VITALS — BP 118/62 | HR 64 | Temp 98.2°F | Resp 12 | Ht 61.0 in | Wt 186.0 lb

## 2014-04-22 DIAGNOSIS — Z23 Encounter for immunization: Secondary | ICD-10-CM

## 2014-04-22 DIAGNOSIS — Z0000 Encounter for general adult medical examination without abnormal findings: Secondary | ICD-10-CM

## 2014-04-22 NOTE — Progress Notes (Signed)
Subjective:   Patient presents for Medicare Annual/ Initial preventive examination.   Review Past Medical/Family/Social: This has been reviewed today and is documented below.  Depression Screen  (Note: if answer to either of the following is "Yes", a more complete depression screening is indicated)  Over the past two weeks, have you felt down, depressed or hopeless? No Over the past two weeks, have you felt little interest or pleasure in doing things? No Have you lost interest or pleasure in daily life? No Do you often feel hopeless? No Do you cry easily over simple problems? No   Activities of Daily Living  In your present state of health, do you have any difficulty performing the following activities?:  Driving? No  Managing money? No  Feeding yourself? No  Getting from bed to chair? No  Climbing a flight of stairs? No  Preparing food and eating?: No  Bathing or showering? No  Getting dressed: No  Getting to the toilet? No  Using the toilet:No  Moving around from place to place: No  In the past year have you fallen or had a near fall?:No  Are you sexually active? No  Do you have more than one partner? No   Hearing Difficulties: No  Do you often ask people to speak up or repeat themselves? No  Do you experience ringing or noises in your ears? No Do you have difficulty understanding soft or whispered voices? No  Do you feel that you have a problem with memory? No Do you often misplace items? No  Do you feel safe at home? Yes  Cognitive Testing  Alert? Yes Normal Appearance?Yes  Oriented to person? Yes Place? Yes  Time? Yes  Recall of three objects? Yes  Can perform simple calculations? Yes  Displays appropriate judgment?Yes  Can read the correct time from a watch face?Yes   List the Names of Other Physician/Practitioners you currently use:  None  Indicate any recent Medical Services you may have received from other than [facility] providers in the past year (date may be  approximate).   Screening Tests / Date-------SEE BELOW Colonoscopy                     Zostavax  Mammogram  Influenza Vaccine  Tetanus/tdap    Assessment:    Annual wellness medicare exam   Plan:    During the course of the visit the patient was educated and counseled about appropriate screening and preventive services including:  Screening mammography  Colorectal cancer screening  Shingles vaccine. Prescription given to that she can get the vaccine at the pharmacy or Medicare part D.  Screen + for depression. PHQ- 9 score of 12 (moderate depression). We discussed the options of counseling versus possibly a medication. I encouraged her strongly think about the counseling. She is going through some medical problems currently and her husband is as well Mrs. been very stressful for her. She says she will think about it. She does have Xanax to use as needed. Though she may benefit from an SSRI for her more depressive type symptoms but she wants to hold off at this time.  I aksed her to please have her cardioloist send records since we have none on file.  Diet review for nutrition referral? Yes ____ Not Indicated __x__  Patient Instructions (the written plan) was given to the patient.  Medicare Attestation  I have personally reviewed:  The patient's medical and social history  Their use of alcohol, tobacco or  illicit drugs  Their current medications and supplements  The patient's functional ability including ADLs,fall risks, home safety risks, cognitive, and hearing and visual impairment  Diet and physical activities  Evidence for depression or mood disorders  The patient's weight, height, BMI, and visual acuity have been recorded in the chart. I have made referrals, counseling, and provided education to the patient based on review of the above and I have provided the patient with a written personalized care plan for preventive services.     Past Medical History  Diagnosis Date  .  Insomnia   . Fatty liver   . Vitamin D deficiency   . History of skin cancer     Right arm  . Anxiety   . Hyperlipidemia   . HTN (hypertension)   . Rosacea 11/24/2013     Past Surgical History  Procedure Laterality Date  . Tubal ligation  1981  . Appendectomy      Home Meds:  Outpatient Prescriptions Prior to Visit  Medication Sig Dispense Refill  . aspirin 81 MG tablet Take 81 mg by mouth daily.        . benzoyl peroxide-erythromycin (BENZAMYCIN) gel Apply topically 2 (two) times daily. Apply twice daily to affected areas  46.6 g  6  . Calcium Carbonate-Vit D-Min (CALCIUM 1200 PO) Take by mouth daily.        . citalopram (CELEXA) 40 MG tablet TAKE ONE TABLET BY MOUTH ONCE DAILY  30 tablet  0  . fluticasone (FLONASE) 50 MCG/ACT nasal spray Place 2 sprays into both nostrils daily.  16 g  6  . hydrochlorothiazide (HYDRODIURIL) 25 MG tablet Take 1 tablet (25 mg total) by mouth daily.  30 tablet  5  . Omega-3 Fatty Acids (FISH OIL) 1000 MG CAPS Take by mouth daily.        Marland Kitchen omeprazole (PRILOSEC) 20 MG capsule TAKE ONE CAPSULE BY MOUTH ONCE DAILY  30 capsule  6  . azithromycin (ZITHROMAX) 250 MG tablet Day 1: Take 2 daily.  Days 2-5: Take 1 daily.  6 tablet  0   No facility-administered medications prior to visit.    Allergies:  Allergies  Allergen Reactions  . Ampicillin Hives, Shortness Of Breath and Swelling  . Penicillins Hives and Shortness Of Breath  . Crestor [Rosuvastatin Calcium]     Burning sensation  . Ezetimibe-Simvastatin   . Niaspan [Niacin Er]     Fatigues, aches.  . Vytorin [Ezetimibe-Simvastatin]   . Zetia [Ezetimibe]   . Zocor [Simvastatin]   . Welchol [Colesevelam Hcl] Other (See Comments)    Severe bloating even on low dose    History   Social History  . Marital Status: Married    Spouse Name: N/A    Number of Children: N/A  . Years of Education: N/A   Occupational History  . Not on file.   Social History Main Topics  . Smoking status:  Former Smoker    Types: Cigarettes  . Smokeless tobacco: Never Used  . Alcohol Use: No  . Drug Use: No  . Sexual Activity: Not on file   Other Topics Concern  . Not on file   Social History Narrative  . No narrative on file    Family History  Problem Relation Age of Onset  . Colon cancer Neg Hx   . Heart disease Mother 33    aortic valve- had AVR- died 6 wks after  . Aneurysm Father 61    aortic aneursym  .  Heart disease Brother 29    CABG    Physical Exam: Blood pressure 118/62, pulse 64, temperature 98.2 F (36.8 C), temperature source Oral, resp. rate 12, height 5\' 1"  (1.549 m), weight 186 lb (84.369 kg)., Body mass index is 35.16 kg/(m^2). General: Well developed, well nourished, WF. Appears in no acute distress. HEENT: Normocephalic, atraumatic. Conjunctiva pink, sclera non-icteric. Pupils 2 mm constricting to 1 mm, round, regular, and equally reactive to light and accomodation. EOMI. Internal auditory canal clear. TMs with good cone of light and without pathology. Nasal mucosa pink. Nares are without discharge. No sinus tenderness. Oral mucosa pink.  Pharynx without exudate.   Neck: Supple. Trachea midline. No thyromegaly. Full ROM. No lymphadenopathy.No Carotid Bruits. Lungs: Clear to auscultation bilaterally without wheezes, rales, or rhonchi. Breathing is of normal effort and unlabored. Cardiovascular: RRR with S1 S2. No murmurs, rubs, or gallops. Distal pulses 2+ symmetrically. No carotid or abdominal bruits. Breast: Symmetrical. No masses. Nipples without discharge. Abdomen: Soft, non-tender, non-distended with normoactive bowel sounds. No hepatosplenomegaly or masses. No rebound/guarding. No CVA tenderness. No hernias.  Genitourinary:  External genitalia without lesions. Vaginal mucosa pink.No discharge present. Cervix pink and without discharge. No cervical tenderness.Normal uterus size. No adnexal mass or tenderness.  Pap smear taken. Musculoskeletal: Full range of  motion and 5/5 strength throughout. Skin: Warm and moist without erythema, ecchymosis, wounds, or rash. Neuro: A+Ox3. CN II-XII grossly intact. Moves all extremities spontaneously. Full sensation throughout. Normal gait. Psych:  Responds to questions appropriately with a normal affect.   Assessment/Plan:  73 y.o. y/o female here for CPE 1. Encounter for initial preventive physical examination covered by Medicare - PAP, ThinPrep, Imaging, Medicare - DG Bone Density; Future - Pneumococcal conjugate vaccine 13-valent  2. Need for prophylactic vaccination against Streptococcus pneumoniae (pneumococcus) - Pneumococcal conjugate vaccine 13-valent   A. Screening Labs: She had screening labs drawn on 04/15/14. These were all normal except for her lipid panel. She has a known history of severe hyperlipidemia but has been intolerant to all cholesterol medications including statins, Zetia, WelChol.   B. Pap: She has had no Pap smear in over 5 years. She is agreeable to have pelvic exam and Pap smear today.  C. Screening Mammogram: She reports that her last mammogram was January 2015 and was negative. She states that she has this every year.  D. DEXA/BMD:  Last bone density was in was January 2011. It was normal. She is on calcium vitamin D and exercise. I recommended that we get a followup bone density test as it has been for years. She is agreeable and I have placed the order for this today.  E. Colorectal Cancer Screening:  Last colonoscopy was 10/06/2005 by Dr. Ardis Hughs --[facility]. Due for followup 2017.   F. family history of AAA- she already underwent screening ultrasound for this 10/2007 and ultrasound was normal.  F. Immunizations:  Influenza:  N/A Tetanus:  This vaccine is not covered by Medicare. Therefore deferred. Pneumococcal:  She received Pneumovax 23 in the past. Will give Prevnar 13 today. Patient agreeable. Zostavax: She received Zostavax here 10/08/2007.  She has an eye  exam annually. Says last visit there was less than 1 year ago. sys she had no glaucoma. Has cataracts and does wear eyeglasses but no other ophthalmologic problems.  Followup in 1 year for next Medicare physical.  Signed, Olean Ree [place], [place], [facility] 04/22/2014 1:44 PM

## 2014-04-23 LAB — PAP, THIN PREP, IMAGING, MEDICARE

## 2014-04-24 ENCOUNTER — Encounter: Payer: Self-pay | Admitting: *Deleted

## 2014-05-03 ENCOUNTER — Other Ambulatory Visit: Payer: Self-pay | Admitting: Physician Assistant

## 2014-05-05 NOTE — Telephone Encounter (Signed)
Refill appropriate and filled per protocol. 

## 2014-05-11 ENCOUNTER — Telehealth: Payer: Self-pay | Admitting: Family Medicine

## 2014-05-11 DIAGNOSIS — Z0000 Encounter for general adult medical examination without abnormal findings: Secondary | ICD-10-CM

## 2014-05-11 DIAGNOSIS — E559 Vitamin D deficiency, unspecified: Secondary | ICD-10-CM

## 2014-05-11 NOTE — Telephone Encounter (Signed)
Patient would like referral for bone density test if possible  [phone]

## 2014-05-11 NOTE — Telephone Encounter (Signed)
LM with significant other to return my call

## 2014-05-13 ENCOUNTER — Other Ambulatory Visit: Payer: Self-pay | Admitting: Family Medicine

## 2014-05-13 DIAGNOSIS — E559 Vitamin D deficiency, unspecified: Secondary | ICD-10-CM

## 2014-05-13 NOTE — Telephone Encounter (Signed)
Pt called back and wants to go to [facility] phone number is [phone]. Pt wants to go on a mon if possible.

## 2014-05-13 NOTE — Telephone Encounter (Signed)
Called pt again and no answer, will try again later.

## 2014-05-13 NOTE — Telephone Encounter (Signed)
Pt has appt on Mon Sept 21 at 10am at [facility], pt is aware and order has been faxed

## 2014-05-25 LAB — HM DEXA SCAN

## 2014-06-06 ENCOUNTER — Other Ambulatory Visit: Payer: Self-pay | Admitting: Physician Assistant

## 2014-06-06 NOTE — Telephone Encounter (Signed)
Refill appropriate and filled per protocol. 

## 2014-06-08 ENCOUNTER — Encounter: Payer: Self-pay | Admitting: *Deleted

## 2014-07-06 ENCOUNTER — Other Ambulatory Visit: Payer: Self-pay | Admitting: Physician Assistant

## 2014-07-06 NOTE — Telephone Encounter (Signed)
Refill appropriate and filled per protocol. 

## 2014-07-20 ENCOUNTER — Encounter: Payer: Self-pay | Admitting: Family Medicine

## 2014-08-18 ENCOUNTER — Ambulatory Visit (INDEPENDENT_AMBULATORY_CARE_PROVIDER_SITE_OTHER): Payer: Medicare Other | Admitting: Family Medicine

## 2014-08-18 VITALS — BP 122/68 | HR 72 | Temp 98.6°F | Resp 16 | Ht 61.0 in | Wt 186.0 lb

## 2014-08-18 DIAGNOSIS — J209 Acute bronchitis, unspecified: Secondary | ICD-10-CM

## 2014-08-18 MED ORDER — AZITHROMYCIN 250 MG PO TABS: ORAL_TABLET | ORAL | Status: DC

## 2014-08-18 MED ORDER — ALBUTEROL SULFATE HFA 108 (90 BASE) MCG/ACT IN AERS: 2.0000 | INHALATION_SPRAY | Freq: Four times a day (QID) | RESPIRATORY_TRACT | Status: DC | PRN

## 2014-08-18 MED ORDER — METHYLPREDNISOLONE ACETATE 40 MG/ML IJ SUSP
40.0000 mg | Freq: Once | INTRAMUSCULAR | Status: AC
  Administered 2014-08-18: 40 mg via INTRAMUSCULAR

## 2014-08-18 MED ORDER — PREDNISONE 20 MG PO TABS: ORAL_TABLET | ORAL | Status: DC

## 2014-08-18 NOTE — Patient Instructions (Signed)
Take antibiotics as prescribed Start prednisone pills tomorrow Use robitussin DM Albuterol inhaler for the wheezing F/U as needed

## 2014-08-18 NOTE — Progress Notes (Signed)
Patient ID: Sharon Mcclure, female   DOB: 07-25-1941, 73 y.o.   MRN: [id number]   Subjective:    Patient ID: Sharon Mcclure, female    DOB: 09-Jun-1941, 73 y.o.   MRN: [id number]  Patient presents for Illness  patient here with cough with production tightness in her chest and wheezing she is a nonsmoker. Started with a mild cough and sore throat and has progressed. She did try taken some cough suppressant which did not help. She is also taking a couple days of ibuprofen. + subjective fever. She did have some chills one day.  no body aches. No nausea vomiting or GI symptoms, + flu shot   Review Of Systems:  GEN- denies fatigue, fever, weight loss,weakness, recent illness HEENT- denies eye drainage, change in vision, nasal discharge, CVS- denies chest pain, palpitations RESP- + SOB, +cough, +wheeze ABD- denies N/V, change in stools, abd pain MSK- denies joint pain, muscle aches, injury Neuro- denies headache, dizziness, syncope, seizure activity       Objective:    BP 122/68 mmHg  Pulse 72  Temp(Src) 98.6 F (37 C) (Oral)  Resp 16  Ht 5\' 1"  (1.549 m)  Wt 186 lb (84.369 kg)  BMI 35.16 kg/m2  SpO2 96% GEN- NAD, alert and oriented x3 HEENT- PERRL, EOMI, non injected sclera, pink conjunctiva, MMM, oropharynx mild injection Neck- Supple, no LAD CVS- RRR, no murmur RESP-decreased BS bases, bilat wheeze, normal WOB, mild rales left lower side EXT- No edema Pulses- Radial 2+        Assessment & Plan:      Problem List Items Addressed This Visit    None    Visit Diagnoses    Acute bronchitis, unspecified organism    -  Primary    Treat with depomedrol 40mg  Im x 1, prednisone burst, albuterol, robitussin DM, cover with antibiotics based on age and comorbidites,CXR if not better    Relevant Medications       methylPREDNISolone acetate (DEPO-MEDROL) injection 40 mg (Completed)       Note: This dictation was prepared with Dragon dictation along with smaller phrase  technology. Any transcriptional errors that result from this process are unintentional.

## 2014-09-18 ENCOUNTER — Other Ambulatory Visit: Payer: Self-pay | Admitting: Family Medicine

## 2014-10-06 ENCOUNTER — Telehealth: Payer: Self-pay | Admitting: Family Medicine

## 2014-10-06 MED ORDER — BENZOYL PEROXIDE-ERYTHROMYCIN 5-3 % EX GEL: Freq: Two times a day (BID) | CUTANEOUS | Status: DC

## 2014-10-06 NOTE — Telephone Encounter (Signed)
Medication refilled per protocol. 

## 2014-11-09 ENCOUNTER — Ambulatory Visit
Admission: RE | Admit: 2014-11-09 | Discharge: 2014-11-09 | Disposition: A | Discharge status: Home / Self Care | Payer: Medicare Other | Source: Ambulatory Visit | Attending: Physician Assistant | Admitting: Physician Assistant

## 2014-11-09 ENCOUNTER — Ambulatory Visit (INDEPENDENT_AMBULATORY_CARE_PROVIDER_SITE_OTHER): Payer: Medicare Other | Admitting: Physician Assistant

## 2014-11-09 ENCOUNTER — Encounter: Payer: Self-pay | Admitting: Physician Assistant

## 2014-11-09 VITALS — BP 132/84 | HR 72 | Temp 98.1°F | Resp 18 | Wt 184.0 lb

## 2014-11-09 DIAGNOSIS — R0789 Other chest pain: Secondary | ICD-10-CM

## 2014-11-09 DIAGNOSIS — R140 Abdominal distension (gaseous): Secondary | ICD-10-CM

## 2014-11-09 DIAGNOSIS — K219 Gastro-esophageal reflux disease without esophagitis: Secondary | ICD-10-CM | POA: Diagnosis not present

## 2014-11-09 DIAGNOSIS — M898X1 Other specified disorders of bone, shoulder: Secondary | ICD-10-CM | POA: Diagnosis not present

## 2014-11-09 DIAGNOSIS — R1084 Generalized abdominal pain: Secondary | ICD-10-CM

## 2014-11-09 LAB — CBC WITH DIFFERENTIAL/PLATELET
Basophils Absolute: 0.1 10*3/uL (ref 0.0–0.1)
Basophils Relative: 1 % (ref 0–1)
Eosinophils Absolute: 0.1 10*3/uL (ref 0.0–0.7)
Eosinophils Relative: 2 % (ref 0–5)
HCT: 45.3 % (ref 36.0–46.0)
Hemoglobin: 15.2 g/dL — ABNORMAL HIGH (ref 12.0–15.0)
Lymphocytes Relative: 35 % (ref 12–46)
Lymphs Abs: 2.5 10*3/uL (ref 0.7–4.0)
MCH: 29.8 pg (ref 26.0–34.0)
MCHC: 33.6 g/dL (ref 30.0–36.0)
MCV: 88.8 fL (ref 78.0–100.0)
MPV: 9.6 fL (ref 8.6–12.4)
Monocytes Absolute: 0.7 10*3/uL (ref 0.1–1.0)
Monocytes Relative: 10 % (ref 3–12)
Neutro Abs: 3.6 10*3/uL (ref 1.7–7.7)
Neutrophils Relative %: 52 % (ref 43–77)
Platelets: 273 10*3/uL (ref 150–400)
RBC: 5.1 MIL/uL (ref 3.87–5.11)
RDW: 13.8 % (ref 11.5–15.5)
WBC: 7 10*3/uL (ref 4.0–10.5)

## 2014-11-09 LAB — COMPLETE METABOLIC PANEL WITH GFR
ALT: 29 U/L (ref 0–35)
AST: 26 U/L (ref 0–37)
Albumin: 4.1 g/dL (ref 3.5–5.2)
Alkaline Phosphatase: 60 U/L (ref 39–117)
BUN: 17 mg/dL (ref 6–23)
CO2: 29 mEq/L (ref 19–32)
Calcium: 9.5 mg/dL (ref 8.4–10.5)
Chloride: 98 mEq/L (ref 96–112)
Creat: 0.73 mg/dL (ref 0.50–1.10)
GFR, Est African American: >89 mL/min
GFR, Est Non African American: 83 mL/min
Glucose, Bld: 76 mg/dL (ref 70–99)
Potassium: 3.6 mEq/L (ref 3.5–5.3)
Sodium: 139 mEq/L (ref 135–145)
Total Bilirubin: 0.5 mg/dL (ref 0.2–1.2)
Total Protein: 6.8 g/dL (ref 6.0–8.3)

## 2014-11-10 LAB — H. PYLORI BREATH TEST: H. pylori Breath Test: NOT DETECTED

## 2014-11-10 NOTE — Progress Notes (Signed)
Patient ID: ATHZIRI FREUNDLICH MRN: [id number], DOB: [birth year], 74 y.o. Date of Encounter: @DATE @  Chief Complaint:  Chief Complaint  Patient presents with  . c/o pain under left breast    radiates around side,  c/o alot of indigestion and gas also,  best relief if she can lay flat and stretch out    HPI: 74 y.o. year old obese white female  presents with above symptoms.  She has actually been having 2 issues occurring. She wasn't sure if they were related but I think they're 2 separate issues.  He has been experiencing a pain in her left chest right at the base of the left breast right where the underwire of her bra runs. She says that area is actually sore to touch. She say that the same area on the back ---her left back-- at the same level-- also hurts and that is also sore when palpated. Says that she does not feel that pain at night and does not feel it the first part of the day. Says she starts feeling this pain in the afternoon when holding up the hairdryer. She works as a Theme park manager. She is right handed. 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 In doing this in the afternoons is when she starts feeling the pain in the left chest and left back at the level of bra strap.  Says that when she gets home from work she will go lay down flat on her bed and will get relief. Also says that at work she will go into the bathroom and stretch her arms and gets relief but then the pain just returns.  She also states that she has been having a lot of belching and burping and gas. regarding the belching and burping-- she says that she sometimes even feels and tastes the acid coming up into her throat. Also says that she has a lot of gas. Says when she finishes work and she goes home and lays on her bed to stretch out she says "it is  amazing how much gas comes out when I relax". Says that this has been going on "quite a while" but is getting worse. She was avoiding certain foods but now she can hardly eat anything without feeling the symptoms. As that she's had no significant change in her stools except now she is having some constipation but thinks that is because she is had to eliminate some any things from her diet. Any time she eats things like broccoli or apples etc. she gets extremely bloated. Therefore, has been avoiding these.   Past Medical History  Diagnosis Date  . Insomnia   . Fatty liver   . Vitamin D deficiency   . History of skin cancer     Right arm  . Anxiety   . Hyperlipidemia   . HTN (hypertension)   . Rosacea 11/24/2013     Home Meds: Outpatient Prescriptions Prior to Visit  Medication Sig Dispense Refill  . aspirin 81 MG tablet Take 81 mg by mouth daily.      . benzoyl peroxide-erythromycin (BENZAMYCIN) gel Apply topically 2 (two) times daily. Apply twice daily to affected areas 46.6 g 6  . Calcium Carbonate-Vit D-Min (CALCIUM 1200 PO) Take by mouth daily.      Marland Kitchen  citalopram (CELEXA) 40 MG tablet TAKE ONE TABLET BY MOUTH ONCE DAILY 30 tablet 3  . hydrochlorothiazide (HYDRODIURIL) 25 MG tablet TAKE ONE TABLET BY MOUTH ONCE DAILY 30 tablet 5  . Omega-3 Fatty Acids (FISH OIL) 1000 MG CAPS Take by mouth daily.      Marland Kitchen omeprazole (PRILOSEC) 20 MG capsule TAKE ONE CAPSULE BY MOUTH ONCE DAILY 30 capsule 3  . albuterol (PROVENTIL HFA;VENTOLIN HFA) 108 (90 BASE) MCG/ACT inhaler Inhale 2 puffs into the lungs every 6 (six) hours as needed for wheezing or shortness of breath. 1 Inhaler 0  . azithromycin (ZITHROMAX) 250 MG tablet Take 2 tablets x 1 day,then 1 tablet x 4 days 6 tablet 0  . fluticasone (FLONASE) 50 MCG/ACT nasal spray Place 2 sprays into both nostrils daily. (Patient not taking: Reported on 08/18/2014) 16 g 6  . predniSONE (DELTASONE) 20 MG tablet Take 40mg  once a day x 5 days 10 tablet 0   No  facility-administered medications prior to visit.    Allergies:  Allergies  Allergen Reactions  . Ampicillin Hives, Shortness Of Breath and Swelling  . Penicillins Hives and Shortness Of Breath  . Crestor [Rosuvastatin Calcium]     Burning sensation  . Ezetimibe-Simvastatin   . Niaspan [Niacin Er]     Fatigues, aches.  . Vytorin [Ezetimibe-Simvastatin]   . Zetia [Ezetimibe]   . Zocor [Simvastatin]   . Welchol [Colesevelam Hcl] Other (See Comments)    Severe bloating even on low dose    History   Social History  . Marital Status: Married    Spouse Name: N/A  . Number of Children: N/A  . Years of Education: N/A   Occupational History  . Not on file.   Social History Main Topics  . Smoking status: Former Smoker    Types: Cigarettes  . Smokeless tobacco: Never Used  . Alcohol Use: No  . Drug Use: No  . Sexual Activity: Not on file   Other Topics Concern  . Not on file   Social History Narrative    Family History  Problem Relation Age of Onset  . Colon cancer Neg Hx   . Heart disease Mother 59    aortic valve- had AVR- died 6 wks after  . Aneurysm Father 22    aortic aneursym  . Heart disease Brother 14    CABG     Review of Systems:  See HPI for pertinent ROS. All other ROS negative.    Physical Exam: Blood pressure 132/84, pulse 72, temperature 98.1 F (36.7 C), temperature source Oral, resp. rate 18, weight 184 lb (83.462 kg)., Body mass index is 34.78 kg/(m^2). General: Mildly Obese WF. Appears in no acute distress. Neck: Supple. No thyromegaly. No lymphadenopathy. Lungs: Clear bilaterally to auscultation without wheezes, rales, or rhonchi. Breathing is unlabored. Heart: RRR with S1 S2. No murmurs, rubs, or gallops. Abdomen: Soft, non-tender, non-distended with normoactive bowel sounds. No hepatomegaly. No rebound/guarding. No obvious abdominal masses. Musculoskeletal:  Strength and tone normal for age. Is tenderness with palpation of the anterior  aspect of her left chest right where the underwire of her bra lines. There is tenderness with palpation of her left back just under the inferior aspect of the left scapula at the same level as where her discomfort is on the left anterior chest. Extremities/Skin: Warm and dry. There is no rash along her left flank at the areas of her pain in the left anterior chest and left back. Neuro: Alert and oriented X  3. Moves all extremities spontaneously. Gait is normal. CNII-XII grossly in tact. Psych:  Responds to questions appropriately with a normal affect.     ASSESSMENT AND PLAN:  73 y.o. year old female with  1. Flatulence/gas pain/belching - CBC with Differential/Platelet - COMPLETE METABOLIC PANEL WITH GFR - H. pylori breath test  2. Generalized abdominal pain - CBC with Differential/Platelet - COMPLETE METABOLIC PANEL WITH GFR - H. pylori breath test  3. Gastroesophageal reflux disease, esophagitis presence not specified  4. Pain of left scapula - DG Chest 2 View; Future  5. Atypical chest pain - DG Chest 2 View; Future  I explained to her that I think her pain is musculoskeletal in as a separate issue from the GI complaints. Will obtain x-ray. If this is normal then I recommend routine stretching of her upper body throughout the day.  Regarding her GI symptoms symptoms are highly suggestive of H. pylori. However she is taking omeprazole one every day for the H. pylori test may come back falsely negative. We'll wait to follow-up these results. In the interim told her to continue taking the omeprazole daily and told her to also add a probiotic daily. Follow-up with her once we obtain results of x-ray and labs.   Signed, [address] [place], [place], [facility] 11/10/2014 10:55 AM

## 2014-11-12 ENCOUNTER — Other Ambulatory Visit: Payer: Self-pay | Admitting: *Deleted

## 2014-11-12 DIAGNOSIS — A048 Other specified bacterial intestinal infections: Secondary | ICD-10-CM

## 2014-11-12 DIAGNOSIS — S22070A Wedge compression fracture of T9-T10 vertebra, initial encounter for closed fracture: Secondary | ICD-10-CM

## 2014-11-12 DIAGNOSIS — M40204 Unspecified kyphosis, thoracic region: Secondary | ICD-10-CM

## 2014-11-18 ENCOUNTER — Encounter: Payer: Self-pay | Admitting: Physician Assistant

## 2014-11-28 ENCOUNTER — Other Ambulatory Visit: Payer: Self-pay | Admitting: Physician Assistant

## 2014-11-30 NOTE — Telephone Encounter (Signed)
Medication refilled per protocol. 

## 2014-12-01 ENCOUNTER — Encounter: Payer: Self-pay | Admitting: Physician Assistant

## 2014-12-01 ENCOUNTER — Ambulatory Visit (INDEPENDENT_AMBULATORY_CARE_PROVIDER_SITE_OTHER): Payer: Medicare Other | Admitting: Physician Assistant

## 2014-12-01 VITALS — BP 130/50 | HR 76 | Ht 62.0 in | Wt 192.0 lb

## 2014-12-01 DIAGNOSIS — R1314 Dysphagia, pharyngoesophageal phase: Secondary | ICD-10-CM

## 2014-12-01 DIAGNOSIS — K2100 Gastro-esophageal reflux disease with esophagitis, without bleeding: Secondary | ICD-10-CM

## 2014-12-01 DIAGNOSIS — R140 Abdominal distension (gaseous): Secondary | ICD-10-CM

## 2014-12-01 NOTE — Progress Notes (Signed)
Patient ID: Sharon Mcclure, female   DOB: 1940-12-18, 74 y.o.   MRN: [id number]   Subjective:    Patient ID: Sharon Mcclure, female    DOB: Jun 12, 1941, 74 y.o.   MRN: [id number]  HPI Sharon Mcclure is a pleasant 74 year old white female known to Dr. Ardis Hughs from prior colonoscopy. She is referred today by primary care/[facility] for evaluation of noting gas and reflux. Apparently patient has prior history of H. pylori and there was some concern she may have a recurrence. She had H. pylori breath testing done through her PCP which was negative. She's been on long-term omeprazole 20 mg by mouth daily for many years.  She has not had prior endoscopy, colonoscopy in 2007 revealed 2 small hyperplastic polyps next or hemorrhoids. Patient says she's had chronic reflux symptoms for many years and that Prilosec and always controlled this. Now over the past several months she has developed increased burping gas belching and a burning sensation in her epigastrium. She has also altered her diet because foods like meat and cornbread sometimes larger pieces of fruit have been transiently sticking in her esophagus. Is not had any episodes of regurgitation but has to stop eating briefly until the food goes down and usually drinks a lot of water to aid this. If she sticks to grand ground up meat and very soft foods she does better. She has daytime symptoms with the belching and burping and also tends to have late evening symptoms. She says she has learned that she doesn't eat late in the evening and if she has to work later she'll just drink a protein shake or something rather than eating a meal. No real complaints of abdominal pain just upper abdominal bloating sensation. She has been taking Aleve recently one twice daily but just over the past few weeks.  Review of Systems Pertinent positive and negative review of systems were noted in the above HPI section.  All other review of systems was otherwise  negative.  Outpatient Encounter Prescriptions as of 12/01/2014  Medication Sig  . aspirin 81 MG tablet Take 81 mg by mouth daily.    . benzoyl peroxide-erythromycin (BENZAMYCIN) gel Apply topically 2 (two) times daily. Apply twice daily to affected areas  . Calcium Carbonate-Vit D-Min (CALCIUM 1200 PO) Take by mouth daily.    . citalopram (CELEXA) 40 MG tablet TAKE ONE TABLET BY MOUTH ONCE DAILY  . hydrochlorothiazide (HYDRODIURIL) 25 MG tablet TAKE ONE TABLET BY MOUTH ONCE DAILY  . Omega-3 Fatty Acids (FISH OIL) 1000 MG CAPS Take by mouth daily.    Marland Kitchen omeprazole (PRILOSEC) 20 MG capsule TAKE ONE CAPSULE BY MOUTH ONCE DAILY   Allergies  Allergen Reactions  . Ampicillin Hives, Shortness Of Breath and Swelling  . Penicillins Hives and Shortness Of Breath  . Crestor [Rosuvastatin Calcium]     Burning sensation  . Ezetimibe-Simvastatin   . Niaspan [Niacin Er]     Fatigues, aches.  . Vytorin [Ezetimibe-Simvastatin]   . Zetia [Ezetimibe]   . Zocor [Simvastatin]   . Welchol [Colesevelam Hcl] Other (See Comments)    Severe bloating even on low dose   Patient Active Problem List   Diagnosis Date Noted  . Rosacea 11/24/2013  . Vitamin D deficiency 09/29/2013  . GERD (gastroesophageal reflux disease)   . Vitamin D deficiency   . History of skin cancer   . Anxiety   . Hyperlipidemia   . HTN (hypertension)   . Left groin pain 02/20/2011  History   Social History  . Marital Status: Married    Spouse Name: N/A  . Number of Children: N/A  . Years of Education: N/A   Occupational History  . Not on file.   Social History Main Topics  . Smoking status: Former Smoker    Types: Cigarettes  . Smokeless tobacco: Never Used  . Alcohol Use: No  . Drug Use: No  . Sexual Activity: Not on file   Other Topics Concern  . Not on file   Social History Narrative    Sharon Mcclure family history includes Aneurysm (age of onset: 61) in her father; Heart disease (age of onset: 24) in her  brother; Heart disease (age of onset: 48) in her mother. There is no history of Colon cancer.      Objective:    Filed Vitals:   12/01/14 0932  BP: 130/50  Pulse: 76    Physical Exam   well-developed older white female in no acute distress, pleasant blood pressure 130/50 pulse 76 height 5 foot 2 weight 192. HEENT; nontraumatic normocephalic EOMI PERRLA sclera anicteric, Supple ;no JVD, Cardiovascular; regular rate and rhythm with S1-S2 no murmur or gallop, Pulmonary ;clear bilaterally, Abdomen ;soft basically nontender there is no palpable mass or hepatosplenomegaly bowel sounds are present, Rectal; exam not done, Ext; is no clubbing cyanosis or edema skin warm and dry, Psych ;mood and affect appropriate       Assessment & Plan:   #41  74 year old white female with long-term chronic GERD with increased symptoms over the past few months so sedated with gas belching and bloating sour brash and solid food dysphagia. Majority of her symptoms are consistent with poorly controlled acid reflux, suspect she may have developed a stricture. We will also rule out gallbladder disease #2 remote history of H. pylori recent breath test negative while on PPI #3 history of hyperplastic colon polyps due for follow-up 2017  Plan;Patient will be scheduled for EGD with probable esophageal dilation with Dr. Ardis Hughs. Procedure was discussed in detail with patient and she is agreeable to proceed. We will increase her omeprazole to 20 mg by mouth before meals breakfast and before meals dinner. If this regimen is ineffective we will use an alternative PPI Schedule upper abdominal ultrasound Antireflux regimen was reviewed and advise she elevate the head of her bed 45 as well.   Amy S Esterwood PA-C 12/01/2014   Cc: Susy Frizzle, MD

## 2014-12-01 NOTE — Patient Instructions (Signed)
You have been scheduled for an endoscopy. Please follow written instructions given to you at your visit today. If you use inhalers (even only as needed), please bring them with you on the day of your procedure. Your physician has requested that you go to www.startemmi.com and enter the access code given to you at your visit today. This web site gives a general overview about your procedure. However, you should still follow specific instructions given to you by our office regarding your preparation for the procedure.  We have given you anti- reflux measures. Increase Omeprazole to twice daily.  Continue the Align.

## 2014-12-01 NOTE — Progress Notes (Signed)
i agree with the above note, plan 

## 2014-12-07 ENCOUNTER — Encounter: Payer: Self-pay | Admitting: Gastroenterology

## 2014-12-07 ENCOUNTER — Ambulatory Visit (HOSPITAL_COMMUNITY)
Admission: RE | Admit: 2014-12-07 | Discharge: 2014-12-07 | Disposition: A | Discharge status: Home / Self Care | Payer: Medicare Other | Source: Ambulatory Visit | Attending: Physician Assistant | Admitting: Physician Assistant

## 2014-12-07 DIAGNOSIS — R10817 Generalized abdominal tenderness: Secondary | ICD-10-CM | POA: Diagnosis not present

## 2014-12-07 DIAGNOSIS — R140 Abdominal distension (gaseous): Secondary | ICD-10-CM | POA: Diagnosis not present

## 2014-12-09 ENCOUNTER — Other Ambulatory Visit: Payer: Self-pay | Admitting: Orthopaedic Surgery

## 2014-12-09 DIAGNOSIS — M5134 Other intervertebral disc degeneration, thoracic region: Secondary | ICD-10-CM

## 2014-12-14 ENCOUNTER — Ambulatory Visit (AMBULATORY_SURGERY_CENTER): Payer: Medicare Other | Admitting: Gastroenterology

## 2014-12-14 ENCOUNTER — Encounter: Payer: Self-pay | Admitting: Gastroenterology

## 2014-12-14 VITALS — BP 131/76 | HR 57 | Temp 98.2°F | Resp 15 | Ht 62.0 in | Wt 192.0 lb

## 2014-12-14 DIAGNOSIS — K222 Esophageal obstruction: Secondary | ICD-10-CM | POA: Diagnosis not present

## 2014-12-14 DIAGNOSIS — K449 Diaphragmatic hernia without obstruction or gangrene: Secondary | ICD-10-CM

## 2014-12-14 DIAGNOSIS — K2100 Gastro-esophageal reflux disease with esophagitis, without bleeding: Secondary | ICD-10-CM

## 2014-12-14 MED ORDER — SODIUM CHLORIDE 0.9 % IV SOLN: 500.0000 mL | INTRAVENOUS | Status: DC

## 2014-12-14 NOTE — Progress Notes (Signed)
To recovery, report to Hodges, RN, VSS 

## 2014-12-14 NOTE — Patient Instructions (Signed)
YOU HAD AN ENDOSCOPIC PROCEDURE TODAY AT [facility]:   Refer to the procedure report that was given to you for any specific questions about what was found during the examination.  If the procedure report does not answer your questions, please call your gastroenterologist to clarify.  If you requested that your care partner not be given the details of your procedure findings, then the procedure report has been included in a sealed envelope for you to review at your convenience later.  YOU SHOULD EXPECT: Some feelings of bloating in the abdomen. Passage of more gas than usual.  Walking can help get rid of the air that was put into your GI tract during the procedure and reduce the bloating. If you had a lower endoscopy (such as a colonoscopy or flexible sigmoidoscopy) you may notice spotting of blood in your stool or on the toilet paper. If you underwent a bowel prep for your procedure, you may not have a normal bowel movement for a few days.  Please Note:  You might notice some irritation and congestion in your nose or some drainage.  This is from the oxygen used during your procedure.  There is no need for concern and it should clear up in a day or so.  SYMPTOMS TO REPORT IMMEDIATELY:    Following upper endoscopy (EGD)  Vomiting of blood or coffee ground material  New chest pain or pain under the shoulder blades  Painful or persistently difficult swallowing  New shortness of breath  Fever of 100F or higher  Black, tarry-looking stools  For urgent or emergent issues, a gastroenterologist can be reached at any hour by calling [phone].   DIET: Clear liquids til 10:00am today then a soft dieet for the rest of the day today.  Regular diet tomorrow.  Chew your food well, and take your medicine as directed.Marland Kitchen Heavy or fried foods are harder to digest and may make you feel nauseous or bloated.  Likewise, meals heavy in dairy and vegetables can increase bloating.  Drink plenty of  fluids but you should avoid alcoholic beverages for 24 hours.  ACTIVITY:  You should plan to take it easy for the rest of today and you should NOT DRIVE or use heavy machinery until tomorrow (because of the sedation medicines used during the test).    FOLLOW UP: Our staff will call the number listed on your records the next business day following your procedure to check on you and address any questions or concerns that you may have regarding the information given to you following your procedure. If we do not reach you, we will leave a message.  However, if you are feeling well and you are not experiencing any problems, there is no need to return our call.  We will assume that you have returned to your regular daily activities without incident.  If any biopsies were taken you will be contacted by phone or by letter within the next 1-3 weeks.  Please call us at [phone] if you have not heard about the biopsies in 3 weeks.    SIGNATURES/CONFIDENTIALITY: You and/or your care partner have signed paperwork which will be entered into your electronic medical record.  These signatures attest to the fact that that the information above on your After Visit Summary has been reviewed and is understood.  Full responsibility of the confidentiality of this discharge information lies with you and/or your care-partner.  Read all handouts given to you by your recovery room  nurse.

## 2014-12-14 NOTE — Op Note (Signed)
[facility]  [address] [place], [id number]   ENDOSCOPY PROCEDURE REPORT  PATIENT: Sharon Mcclure, Sharon Mcclure  MR#: [id number] BIRTHDATE: Nov 24, 1940 , 56  yrs. old GENDER: female ENDOSCOPIST: Milus Banister, MD PROCEDURE DATE:  12/14/2014 PROCEDURE:  EGD w/ balloon dilation ASA CLASS:     Class II INDICATIONS:  GERD, intermittent dysphagia. MEDICATIONS: Monitored anesthesia care and Propofol 150 mg IV TOPICAL ANESTHETIC: none  DESCRIPTION OF PROCEDURE: After the risks benefits and alternatives of the procedure were thoroughly explained, informed consent was obtained.  The LB [id number] endoscope was introduced through the mouth and advanced to the second portion of the duodenum , Without limitations.  The instrument was slowly withdrawn as the mucosa was fully examined.  There was a 2-3cm sliding hiatal hernia with resultant tortuous distal esophagus.  There was a mild, smooth stenosis at GE junction that did not impede passage of adult gastroscope.  Balloon dilation was performed at the GE junction up to 23mm with CRE TTS balloon. There was no bleeding following dilation.  The examination was otherwise normal.  Retroflexed views revealed no abnormalities. The scope was then withdrawn from the patient and the procedure completed.  COMPLICATIONS: There were no immediate complications.  ENDOSCOPIC IMPRESSION: There was a 2-3cm sliding hiatal hernia with resultant tortuous distal esophagus.  There was a mild, smooth stenosis at GE junction that did not impede passage of adult gastroscope.  Balloon dilation was performed at the GE junction up to 14mm with CRE TTS balloon. There was no bleeding following dilation.  The examination was otherwise normal  RECOMMENDATIONS: Please continue prilosec TWICE daily (before BF and dinner meals). Chew your food well, eat slowly and take small bites.   eSigned:  Milus Banister, MD 12/14/2014 9:04 AM    CC: Jenna Luo, MD

## 2014-12-15 ENCOUNTER — Telehealth: Payer: Self-pay | Admitting: Family Medicine

## 2014-12-15 ENCOUNTER — Telehealth: Payer: Self-pay | Admitting: *Deleted

## 2014-12-15 MED ORDER — OMEPRAZOLE 20 MG PO CPDR: 20.0000 mg | DELAYED_RELEASE_CAPSULE | Freq: Two times a day (BID) | ORAL | Status: DC

## 2014-12-15 NOTE — Telephone Encounter (Signed)
Spoke to patient made aware of contraindication noted.  Is willing to try lower dose of Celexa.  Wean down to 20 mg dose over next week, then take 20 mg daily.  Let us know in one month how that is working for her.  If feels not working then can change go different antidepressant.

## 2014-12-15 NOTE — Telephone Encounter (Signed)
Celexa can cause the levels of omeprazole to rise in her blood due to preventing the breakdown of the chemical by her liver. I would reduce Celexa to 20 mg a day to offset the increased dose of omeprazole. If the patient does not feel comfortable doing this, another option would be switching Celexa to a different antidepressant

## 2014-12-15 NOTE — Telephone Encounter (Signed)
Trying to refill omeprazole per GI request to [facility] but get red flag about high dose use and Celexa.  Please advise?

## 2014-12-15 NOTE — Telephone Encounter (Signed)
  Follow up Call-  Call back number 12/14/2014  Post procedure Call Back phone  # [phone]  Permission to leave phone message Yes     Patient questions:  Do you have a fever, pain , or abdominal swelling? No. Pain Score  0 *  Have you tolerated food without any problems? Yes.    Have you been able to return to your normal activities? Yes.    Do you have any questions about your discharge instructions: Diet   No. Medications  No. Follow up visit  No.  Do you have questions or concerns about your Care? No.  Actions: * If pain score is 4 or above: No action needed, pain <4.

## 2014-12-15 NOTE — Telephone Encounter (Signed)
Patient is calling to say that dr Ardis Hughs that did her endoscopy, said that she needed to double up on her omeprazole would like to know if that was in the report from the endoscopy that she had and wants to know if we can do that for her     [place]

## 2014-12-16 ENCOUNTER — Ambulatory Visit
Admission: RE | Admit: 2014-12-16 | Discharge: 2014-12-16 | Disposition: A | Discharge status: Home / Self Care | Payer: Medicare Other | Source: Ambulatory Visit | Attending: Orthopaedic Surgery | Admitting: Orthopaedic Surgery

## 2014-12-16 DIAGNOSIS — M5134 Other intervertebral disc degeneration, thoracic region: Secondary | ICD-10-CM

## 2015-01-08 ENCOUNTER — Encounter: Payer: Self-pay | Admitting: Physician Assistant

## 2015-01-08 DIAGNOSIS — M546 Pain in thoracic spine: Secondary | ICD-10-CM

## 2015-01-08 DIAGNOSIS — S22000A Wedge compression fracture of unspecified thoracic vertebra, initial encounter for closed fracture: Secondary | ICD-10-CM

## 2015-01-08 HISTORY — DX: Pain in thoracic spine: M54.6

## 2015-01-08 HISTORY — DX: Wedge compression fracture of unspecified thoracic vertebra, initial encounter for closed fracture: S22.000A

## 2015-04-14 ENCOUNTER — Other Ambulatory Visit: Payer: Self-pay | Admitting: Physician Assistant

## 2015-04-26 ENCOUNTER — Encounter: Payer: Self-pay | Admitting: Physician Assistant

## 2015-05-22 ENCOUNTER — Other Ambulatory Visit: Payer: Self-pay | Admitting: Physician Assistant

## 2015-05-24 NOTE — Telephone Encounter (Signed)
Medication refilled per protocol. 

## 2015-05-29 DIAGNOSIS — J189 Pneumonia, unspecified organism: Secondary | ICD-10-CM

## 2015-05-29 HISTORY — DX: Pneumonia, unspecified organism: J18.9

## 2015-06-09 ENCOUNTER — Other Ambulatory Visit: Payer: Self-pay | Admitting: Family Medicine

## 2015-06-09 MED ORDER — BENZOYL PEROXIDE-ERYTHROMYCIN 5-3 % EX GEL
CUTANEOUS | Status: DC
Start: 2015-06-09 — End: 2016-12-18

## 2015-06-09 NOTE — Telephone Encounter (Signed)
Prescription sent to pharmacy.

## 2015-06-09 NOTE — Telephone Encounter (Signed)
Pt went to p/u her RX for Erythromycin gel and we had only called in one tube,  she states that we usually call in two. She would like to know if we can call in the second tube of the gel.  [facility] in [place] Pt's # [phone] *leave message if not available

## 2015-06-19 ENCOUNTER — Other Ambulatory Visit: Payer: Self-pay | Admitting: Physician Assistant

## 2015-06-21 ENCOUNTER — Encounter: Payer: Self-pay | Admitting: Family Medicine

## 2015-06-21 NOTE — Telephone Encounter (Signed)
Medication refill for one time only.  Patient needs to be seen.  Letter sent for patient to call and schedule 

## 2015-06-27 ENCOUNTER — Encounter: Payer: Self-pay | Admitting: Emergency Medicine

## 2015-06-27 ENCOUNTER — Emergency Department: Payer: Medicare Other

## 2015-06-27 ENCOUNTER — Inpatient Hospital Stay
Admission: EM | Admit: 2015-06-27 | Discharge: 2015-06-30 | DRG: 871 | Disposition: A | Discharge status: Home / Self Care | Payer: Medicare Other | Attending: Internal Medicine | Admitting: Internal Medicine

## 2015-06-27 DIAGNOSIS — Z888 Allergy status to other drugs, medicaments and biological substances status: Secondary | ICD-10-CM | POA: Diagnosis not present

## 2015-06-27 DIAGNOSIS — Z85828 Personal history of other malignant neoplasm of skin: Secondary | ICD-10-CM | POA: Diagnosis not present

## 2015-06-27 DIAGNOSIS — F419 Anxiety disorder, unspecified: Secondary | ICD-10-CM | POA: Diagnosis present

## 2015-06-27 DIAGNOSIS — E785 Hyperlipidemia, unspecified: Secondary | ICD-10-CM | POA: Diagnosis present

## 2015-06-27 DIAGNOSIS — J189 Pneumonia, unspecified organism: Secondary | ICD-10-CM | POA: Diagnosis present

## 2015-06-27 DIAGNOSIS — E782 Mixed hyperlipidemia: Secondary | ICD-10-CM | POA: Diagnosis present

## 2015-06-27 DIAGNOSIS — F329 Major depressive disorder, single episode, unspecified: Secondary | ICD-10-CM | POA: Diagnosis present

## 2015-06-27 DIAGNOSIS — Z79899 Other long term (current) drug therapy: Secondary | ICD-10-CM | POA: Diagnosis not present

## 2015-06-27 DIAGNOSIS — G4700 Insomnia, unspecified: Secondary | ICD-10-CM | POA: Diagnosis present

## 2015-06-27 DIAGNOSIS — Z7982 Long term (current) use of aspirin: Secondary | ICD-10-CM

## 2015-06-27 DIAGNOSIS — K219 Gastro-esophageal reflux disease without esophagitis: Secondary | ICD-10-CM | POA: Diagnosis present

## 2015-06-27 DIAGNOSIS — I10 Essential (primary) hypertension: Secondary | ICD-10-CM | POA: Diagnosis present

## 2015-06-27 DIAGNOSIS — K760 Fatty (change of) liver, not elsewhere classified: Secondary | ICD-10-CM | POA: Diagnosis present

## 2015-06-27 DIAGNOSIS — E559 Vitamin D deficiency, unspecified: Secondary | ICD-10-CM | POA: Diagnosis present

## 2015-06-27 DIAGNOSIS — Z880 Allergy status to penicillin: Secondary | ICD-10-CM | POA: Diagnosis not present

## 2015-06-27 DIAGNOSIS — E876 Hypokalemia: Secondary | ICD-10-CM | POA: Diagnosis present

## 2015-06-27 DIAGNOSIS — E871 Hypo-osmolality and hyponatremia: Secondary | ICD-10-CM | POA: Diagnosis present

## 2015-06-27 DIAGNOSIS — Z881 Allergy status to other antibiotic agents status: Secondary | ICD-10-CM | POA: Diagnosis not present

## 2015-06-27 DIAGNOSIS — A419 Sepsis, unspecified organism: Principal | ICD-10-CM | POA: Diagnosis present

## 2015-06-27 LAB — CBC
HCT: 36 % (ref 35.0–47.0)
Hemoglobin: 12.5 g/dL (ref 12.0–16.0)
MCH: 30 pg (ref 26.0–34.0)
MCHC: 34.6 g/dL (ref 32.0–36.0)
MCV: 86.7 fL (ref 80.0–100.0)
Platelets: 230 10*3/uL (ref 150–440)
RBC: 4.15 MIL/uL (ref 3.80–5.20)
RDW: 13.1 % (ref 11.5–14.5)
WBC: 15.8 10*3/uL — ABNORMAL HIGH (ref 3.6–11.0)

## 2015-06-27 LAB — COMPREHENSIVE METABOLIC PANEL
ALT: 33 U/L (ref 14–54)
AST: 38 U/L (ref 15–41)
Albumin: 3.2 g/dL — ABNORMAL LOW (ref 3.5–5.0)
Alkaline Phosphatase: 83 U/L (ref 38–126)
Anion gap: 11 (ref 5–15)
BUN: 9 mg/dL (ref 6–20)
CO2: 29 mmol/L (ref 22–32)
Calcium: 8.6 mg/dL — ABNORMAL LOW (ref 8.9–10.3)
Chloride: 89 mmol/L — ABNORMAL LOW (ref 101–111)
Creatinine, Ser: 0.65 mg/dL (ref 0.44–1.00)
GFR calc Af Amer: >60 mL/min (ref >60)
GFR calc non Af Amer: >60 mL/min (ref >60)
Glucose, Bld: 122 mg/dL — ABNORMAL HIGH (ref 65–99)
Potassium: 2.9 mmol/L — CL (ref 3.5–5.1)
Sodium: 129 mmol/L — ABNORMAL LOW (ref 135–145)
Total Bilirubin: 1.2 mg/dL (ref 0.3–1.2)
Total Protein: 7 g/dL (ref 6.5–8.1)

## 2015-06-27 LAB — TROPONIN I: Troponin I: <0.03 ng/mL (ref <0.031)

## 2015-06-27 LAB — POCT RAPID STREP A: Streptococcus, Group A Screen (Direct): NEGATIVE

## 2015-06-27 MED ORDER — IPRATROPIUM-ALBUTEROL 0.5-2.5 (3) MG/3ML IN SOLN: 3.0000 mL | RESPIRATORY_TRACT | Status: DC | PRN

## 2015-06-27 MED ORDER — ACETAMINOPHEN 650 MG RE SUPP: 650.0000 mg | Freq: Four times a day (QID) | RECTAL | Status: DC | PRN

## 2015-06-27 MED ORDER — SODIUM CHLORIDE 0.9 % IV BOLUS (SEPSIS)
500.0000 mL | Freq: Once | INTRAVENOUS | Status: AC
  Administered 2015-06-27: 500 mL via INTRAVENOUS

## 2015-06-27 MED ORDER — SODIUM CHLORIDE 0.9 % IV SOLN
INTRAVENOUS | Status: DC
  Administered 2015-06-28: via INTRAVENOUS

## 2015-06-27 MED ORDER — SODIUM CHLORIDE 0.9 % IJ SOLN
3.0000 mL | Freq: Two times a day (BID) | INTRAMUSCULAR | Status: DC
  Administered 2015-06-28 – 2015-06-29 (×3): 3 mL via INTRAVENOUS

## 2015-06-27 MED ORDER — ASPIRIN 81 MG PO CHEW
81.0000 mg | CHEWABLE_TABLET | Freq: Every day | ORAL | Status: DC
  Administered 2015-06-28 – 2015-06-30 (×3): 81 mg via ORAL
  Filled 2015-06-27 (×3): qty 1

## 2015-06-27 MED ORDER — LEVOFLOXACIN IN D5W 500 MG/100ML IV SOLN
500.0000 mg | INTRAVENOUS | Status: DC
  Administered 2015-06-28: 500 mg via INTRAVENOUS
  Filled 2015-06-27 (×2): qty 100

## 2015-06-27 MED ORDER — POTASSIUM CHLORIDE 10 MEQ/100ML IV SOLN
10.0000 meq | INTRAVENOUS | Status: AC
  Administered 2015-06-28 (×4): 10 meq via INTRAVENOUS
  Filled 2015-06-27 (×4): qty 100

## 2015-06-27 MED ORDER — ONDANSETRON HCL 4 MG/2ML IJ SOLN: 4.0000 mg | Freq: Four times a day (QID) | INTRAMUSCULAR | Status: DC | PRN

## 2015-06-27 MED ORDER — ACETAMINOPHEN 325 MG PO TABS
650.0000 mg | ORAL_TABLET | Freq: Four times a day (QID) | ORAL | Status: DC | PRN
  Administered 2015-06-28 – 2015-06-29 (×2): 650 mg via ORAL
  Filled 2015-06-27 (×2): qty 2

## 2015-06-27 MED ORDER — ENOXAPARIN SODIUM 40 MG/0.4ML ~~LOC~~ SOLN
40.0000 mg | SUBCUTANEOUS | Status: DC
  Administered 2015-06-28 – 2015-06-29 (×3): 40 mg via SUBCUTANEOUS
  Filled 2015-06-27 (×3): qty 0.4

## 2015-06-27 MED ORDER — OMEGA-3-ACID ETHYL ESTERS 1 G PO CAPS
1.0000 g | ORAL_CAPSULE | Freq: Every day | ORAL | Status: DC
  Administered 2015-06-28 – 2015-06-30 (×3): 1 g via ORAL
  Filled 2015-06-27 (×3): qty 1

## 2015-06-27 MED ORDER — CITALOPRAM HYDROBROMIDE 20 MG PO TABS
40.0000 mg | ORAL_TABLET | Freq: Every day | ORAL | Status: DC
  Administered 2015-06-28 – 2015-06-30 (×3): 40 mg via ORAL
  Filled 2015-06-27 (×3): qty 2

## 2015-06-27 MED ORDER — BENZONATATE 100 MG PO CAPS
200.0000 mg | ORAL_CAPSULE | Freq: Three times a day (TID) | ORAL | Status: DC | PRN
  Administered 2015-06-28: 200 mg via ORAL
  Filled 2015-06-27: qty 2

## 2015-06-27 MED ORDER — POTASSIUM CHLORIDE CRYS ER 20 MEQ PO TBCR
40.0000 meq | EXTENDED_RELEASE_TABLET | Freq: Once | ORAL | Status: AC
  Administered 2015-06-28: 20 meq via ORAL
  Filled 2015-06-27: qty 2

## 2015-06-27 MED ORDER — ONDANSETRON HCL 4 MG PO TABS: 4.0000 mg | ORAL_TABLET | Freq: Four times a day (QID) | ORAL | Status: DC | PRN

## 2015-06-27 MED ORDER — POTASSIUM CHLORIDE CRYS ER 20 MEQ PO TBCR
40.0000 meq | EXTENDED_RELEASE_TABLET | Freq: Once | ORAL | Status: AC
  Administered 2015-06-27: 40 meq via ORAL
  Filled 2015-06-27: qty 2

## 2015-06-27 MED ORDER — BENZONATATE 100 MG PO CAPS
100.0000 mg | ORAL_CAPSULE | Freq: Once | ORAL | Status: AC
  Administered 2015-06-27: 100 mg via ORAL
  Filled 2015-06-27: qty 1

## 2015-06-27 MED ORDER — GUAIFENESIN-DM 100-10 MG/5ML PO SYRP
5.0000 mL | ORAL_SOLUTION | ORAL | Status: DC | PRN
  Administered 2015-06-28 – 2015-06-30 (×7): 5 mL via ORAL
  Filled 2015-06-27 (×14): qty 5

## 2015-06-27 MED ORDER — LEVOFLOXACIN IN D5W 750 MG/150ML IV SOLN
750.0000 mg | Freq: Once | INTRAVENOUS | Status: AC
  Administered 2015-06-27: 750 mg via INTRAVENOUS
  Filled 2015-06-27: qty 150

## 2015-06-27 MED ORDER — PANTOPRAZOLE SODIUM 40 MG PO TBEC
40.0000 mg | DELAYED_RELEASE_TABLET | Freq: Two times a day (BID) | ORAL | Status: DC
  Administered 2015-06-28 – 2015-06-30 (×5): 40 mg via ORAL
  Filled 2015-06-27 (×5): qty 1

## 2015-06-27 NOTE — ED Notes (Signed)
Daughter reports since Tuesday with cough, body aches and fever. Non- productive cough. Pt reports SOB.

## 2015-06-27 NOTE — H&P (Signed)
[facility] [place] NAME: Sharon Mcclure    MR#:  [id number]  DATE OF BIRTH:  December 21, 1940  DATE OF ADMISSION:  06/27/2015  PRIMARY CARE PHYSICIAN: Odette Fraction, MD   REQUESTING/REFERRING PHYSICIAN: Edd Fabian, M.D.  CHIEF COMPLAINT:   Chief Complaint  Patient presents with  . Shortness of Breath    HISTORY OF PRESENT ILLNESS:  Sharon Mcclure  is a 74 y.o. female who presents with 5 days increasing shortness of breath and progressive nonproductive cough. Patient states she has also had fevers at home, and progressive malaise. She's had a little bit of nausea. Denies any other complaints. Patient has taken Robitussin cough syrup and Mucinex without any improvement in symptoms. In the ED she was found to have chest x-ray findings suspicious for left lower lobe pneumonia. Patient has elevated white count. She is also found to be hyponatremic and hypokalemic, without any associated symptoms reported by patient. Hospitalists were called for admission for community-acquired pneumonia.  PAST MEDICAL HISTORY:   Past Medical History  Diagnosis Date  . Insomnia   . Fatty liver   . Vitamin D deficiency   . History of skin cancer     Right arm  . Anxiety   . Hyperlipidemia   . HTN (hypertension)   . Rosacea 11/24/2013  . GERD (gastroesophageal reflux disease)     PAST SURGICAL HISTORY:   Past Surgical History  Procedure Laterality Date  . Tubal ligation  1981  . Appendectomy      SOCIAL HISTORY:   Social History  Substance Use Topics  . Smoking status: Former Smoker    Types: Cigarettes  . Smokeless tobacco: Never Used  . Alcohol Use: No    FAMILY HISTORY:   Family History  Problem Relation Age of Onset  . Colon cancer Neg Hx   . Stomach cancer Neg Hx   . Heart disease Mother 14    aortic valve- had AVR- died 6 wks after  . Aneurysm Father 41    aortic aneursym  . Heart disease Brother 13    CABG    DRUG  ALLERGIES:   Allergies  Allergen Reactions  . Ampicillin Hives, Shortness Of Breath and Swelling  . Penicillins Hives and Shortness Of Breath  . Crestor [Rosuvastatin Calcium]     Burning sensation  . Ezetimibe-Simvastatin   . Niaspan [Niacin Er]     Fatigues, aches.  . Vytorin [Ezetimibe-Simvastatin]   . Zetia [Ezetimibe]   . Zocor [Simvastatin]   . Welchol [Colesevelam Hcl] Other (See Comments)    Severe bloating even on low dose    MEDICATIONS AT HOME:   Prior to Admission medications   Medication Sig Start Date End Date Taking? Authorizing Provider  aspirin 81 MG tablet Take 81 mg by mouth daily.      Historical Provider, MD  benzoyl peroxide-erythromycin (BENZAMYCIN) gel APPLY   TOPICALLY TO AFFECTED AREA TWICE DAILY 06/09/15   Susy Frizzle, MD  Calcium Carbonate-Vit D-Min (CALCIUM 1200 PO) Take by mouth daily.      Historical Provider, MD  citalopram (CELEXA) 40 MG tablet TAKE ONE TABLET BY MOUTH ONCE DAILY 06/21/15   Orlena Sheldon, PA-C  gabapentin (NEURONTIN) 100 MG capsule Take 100 mg by mouth. Take 1-3 capsule by mouth at bedtime    Historical Provider, MD  hydrochlorothiazide (HYDRODIURIL) 25 MG tablet TAKE ONE TABLET BY MOUTH ONCE DAILY 04/14/15   Orlena Sheldon, PA-C  Omega-3 Fatty Acids (  FISH OIL) 1000 MG CAPS Take by mouth daily.      Historical Provider, MD  omeprazole (PRILOSEC) 20 MG capsule TAKE ONE CAPSULE BY MOUTH TWICE DAILY BEFORE BREAKFAST AND DINNER 06/21/15   Orlena Sheldon, PA-C    REVIEW OF SYSTEMS:  Review of Systems  Constitutional: Positive for fever and malaise/fatigue. Negative for chills and weight loss.  HENT: Negative for ear pain, hearing loss and tinnitus.   Eyes: Negative for blurred vision, double vision, pain and redness.  Respiratory: Positive for cough and shortness of breath. Negative for hemoptysis.   Cardiovascular: Negative for chest pain, palpitations, orthopnea and leg swelling.  Gastrointestinal: Positive for nausea. Negative for  vomiting, abdominal pain, diarrhea and constipation.  Genitourinary: Negative for dysuria, frequency and hematuria.  Musculoskeletal: Negative for back pain, joint pain and neck pain.  Skin:       No acne, rash, or lesions  Neurological: Negative for dizziness, tremors, focal weakness and weakness.  Endo/Heme/Allergies: Negative for polydipsia. Does not bruise/bleed easily.  Psychiatric/Behavioral: Negative for depression. The patient is not nervous/anxious and does not have insomnia.      VITAL SIGNS:   Filed Vitals:   06/27/15 1900 06/27/15 1932 06/27/15 1936 06/27/15 1937  BP: 102/42   139/92  Pulse: 77 82 99 94  Temp:      TempSrc:      Resp: 22   18  Height:      Weight:      SpO2: 91% 90% 90% 94%   Wt Readings from Last 3 Encounters:  06/27/15 74.844 kg (165 lb)  12/14/14 87.091 kg (192 lb)  12/01/14 87.091 kg (192 lb)    PHYSICAL EXAMINATION:  Physical Exam  Vitals reviewed. Constitutional: She is oriented to person, place, and time. She appears well-developed and well-nourished. No distress.  HENT:  Head: Normocephalic and atraumatic.  Mouth/Throat: Oropharynx is clear and moist.  Eyes: Conjunctivae and EOM are normal. Pupils are equal, round, and reactive to light. No scleral icterus.  Neck: Normal range of motion. Neck supple. No JVD present. No thyromegaly present.  Cardiovascular: Normal rate, regular rhythm and intact distal pulses.  Exam reveals no gallop and no friction rub.   No murmur heard. Respiratory: Effort normal. No respiratory distress. She has no wheezes. She has no rales.  Left lower lobe rhonchi  GI: Soft. Bowel sounds are normal. She exhibits no distension. There is no tenderness.  Musculoskeletal: Normal range of motion. She exhibits no edema.  No arthritis, no gout  Lymphadenopathy:    She has no cervical adenopathy.  Neurological: She is alert and oriented to person, place, and time. No cranial nerve deficit.  No dysarthria, no aphasia   Skin: Skin is warm and dry. No rash noted. No erythema.  Psychiatric: She has a normal mood and affect. Her behavior is normal. Judgment and thought content normal.    LABORATORY PANEL:   CBC  Recent Labs Lab 06/27/15 1849  WBC 15.8*  HGB 12.5  HCT 36.0  PLT 230   ------------------------------------------------------------------------------------------------------------------  Chemistries   Recent Labs Lab 06/27/15 1849  NA 129*  K 2.9*  CL 89*  CO2 29  GLUCOSE 122*  BUN 9  CREATININE 0.65  CALCIUM 8.6*  AST 38  ALT 33  ALKPHOS 83  BILITOT 1.2   ------------------------------------------------------------------------------------------------------------------  Cardiac Enzymes  Recent Labs Lab 06/27/15 1849  TROPONINI <0.03   ------------------------------------------------------------------------------------------------------------------  RADIOLOGY:  Dg Chest 2 View  06/27/2015  CLINICAL DATA:  Cough, body  aches, fever EXAM: CHEST  2 VIEW COMPARISON:  11/09/2014 FINDINGS: Mild patchy left lower lobe opacity, suspicious for pneumonia. No pleural effusion or pneumothorax. The heart is normal in size. Degenerative changes of the visualized thoracolumbar spine. Mild lower thoracic compression fracture deformity.a IMPRESSION: Mild patchy left lower lobe opacity, suspicious for pneumonia. Electronically Signed   By: Julian Hy M.D.   On: 06/27/2015 18:07    EKG:   Orders placed or performed during the hospital encounter of 06/27/15  . ED EKG  . ED EKG    IMPRESSION AND PLAN:  Principal Problem:   CAP (community acquired pneumonia) - with elevated white count. Antibiotics started in the ED, and blood cultures sent. We'll admit the patient for continued IV antibiotics at this time. Monitor her white blood count for improvement. Tessalon and Robitussin for cough. Order sputum culture Active Problems:   HTN (hypertension) - only on HCTZ. Hold this for  now as her blood pressure is normotensive to borderline low. Fluids for blood pressure support.   Hyponatremia - normal saline for now as this is likely acute due to her infection. We'll monitor closely.   Hypokalemia - replete. Check magnesium.   Anxiety - continue home meds   Hyperlipidemia - continue home meds   GERD (gastroesophageal reflux disease) - equivalent home dose PPI  All the records are reviewed and case discussed with ED provider. Management plans discussed with the patient and/or family.  DVT PROPHYLAXIS: SubQ lovenox  ADMISSION STATUS: Inpatient  CODE STATUS: Full  TOTAL TIME TAKING CARE OF THIS PATIENT: 45 minutes.    Rogan Ecklund FIELDING 06/27/2015, 9:25 PM  [facility]  Office  [phone]  CC: Primary care physician; Odette Fraction, MD

## 2015-06-27 NOTE — ED Provider Notes (Signed)
[facility] Emergency Department [place]ia [place]angerin Note  ____________________________________________  Time seen: Approximately 5:53 PM  I have reviewed the triage vital signs and the nursing notes.   HISTORY  Chief Complaint Shortness of Breath    HPI Sharon Mcclure is a 74 y.o. female with history of GERD, hypertension, hyperlipidemia who presents for evaluation of diffuse myalgias, fevers, productive cough as well as worsening shortness of breath, gradual onset 5 days ago, worsening, constant since onset. No modifying factors. Currently her symptoms are moderate to severe. She also reports that she has lost her voice secondary to cough. She denies any chest pain, vomiting, diarrhea, pain or burning with urination.   Past Medical History  Diagnosis Date  . Insomnia   . Fatty liver   . Vitamin D deficiency   . History of skin cancer     Right arm  . Anxiety   . Hyperlipidemia   . HTN (hypertension)   . Rosacea 11/24/2013  . GERD (gastroesophageal reflux disease)     Patient Active Problem List   Diagnosis Date Noted  . CAP (community acquired pneumonia) 06/27/2015  . Hyponatremia 06/27/2015  . Thoracic compression fracture ([place]) 01/08/2015  . Pain in thoracic spine 01/08/2015  . Rosacea 11/24/2013  . Vitamin D deficiency 09/29/2013  . GERD (gastroesophageal reflux disease)   . Vitamin D deficiency   . History of skin cancer   . Anxiety   . Hyperlipidemia   . HTN (hypertension)   . Left groin pain 02/20/2011    Past Surgical History  Procedure Laterality Date  . Tubal ligation  1981  . Appendectomy      Current Outpatient Rx  Name  Route  Sig  Dispense  Refill  . aspirin 81 MG tablet   Oral   Take 81 mg by mouth daily.           . benzoyl peroxide-erythromycin (BENZAMYCIN) gel      APPLY   TOPICALLY TO AFFECTED AREA TWICE DAILY   50 g   2   . Calcium Carbonate-Vit D-Min (CALCIUM 1200 PO)   Oral   Take by mouth daily.            . citalopram (CELEXA) 40 MG tablet      TAKE ONE TABLET BY MOUTH ONCE DAILY   30 tablet   0     Patient needs to be seen before any further refill ...   . gabapentin (NEURONTIN) 100 MG capsule   Oral   Take 100 mg by mouth. Take 1-3 capsule by mouth at bedtime         . hydrochlorothiazide (HYDRODIURIL) 25 MG tablet      TAKE ONE TABLET BY MOUTH ONCE DAILY   30 tablet   5   . Omega-3 Fatty Acids (FISH OIL) 1000 MG CAPS   Oral   Take by mouth daily.           Marland Kitchen omeprazole (PRILOSEC) 20 MG capsule      TAKE ONE CAPSULE BY MOUTH TWICE DAILY BEFORE BREAKFAST AND DINNER   60 capsule   0     Patient needs to be seen before any further refill ...     Allergies Ampicillin; Penicillins; Crestor; Ezetimibe-simvastatin; Niaspan; Vytorin; Zetia; Zocor; and Welchol  Family History  Problem Relation Age of Onset  . Colon cancer Neg Hx   . Stomach cancer Neg Hx   . Heart disease Mother 70    aortic valve- had AVR-  died 6 wks after  . Aneurysm Father 70    aortic aneursym  . Heart disease Brother 90    CABG    Social History Social History  Substance Use Topics  . Smoking status: Former Smoker    Types: Cigarettes  . Smokeless tobacco: Never Used  . Alcohol Use: No    Review of Systems Constitutional: + fever/chills Eyes: No visual changes. ENT: + sore throat. Cardiovascular: Denies chest pain. Respiratory: + shortness of breath. Gastrointestinal: No abdominal pain.  No nausea, no vomiting.  No diarrhea.  No constipation. Genitourinary: Negative for dysuria. Musculoskeletal: Negative for back pain. Skin: Negative for rash. Neurological: Negative for headaches, focal weakness or numbness.  10-point ROS otherwise negative.  ____________________________________________   PHYSICAL EXAM:  VITAL SIGNS: ED Triage Vitals  Enc Vitals Group     BP 06/27/15 1724 119/71 mmHg     Pulse Rate 06/27/15 1724 79     Resp 06/27/15 1724 18     Temp 06/27/15 1724  99.5 F (37.5 C)     Temp Source 06/27/15 1724 Oral     SpO2 06/27/15 1724 95 %     Weight 06/27/15 1724 165 lb (74.844 kg)     Height 06/27/15 1724 5\' 2"  (1.575 m)     Head Cir --      Peak Flow --      Pain Score --      Pain Loc --      Pain Edu? --      Excl. in [place]? --     Constitutional: Alert and oriented. Fatigued but nontoxic-appearing and in no acute distress. Eyes: Conjunctivae are normal. PERRL. EOMI. Head: Atraumatic. Nose: No congestion/rhinnorhea. Mouth/Throat: Mucous membranes are moist.  Oropharynx erythematous. Neck: No stridor.   Cardiovascular: Normal rate, regular rhythm. Grossly normal heart sounds.  Good peripheral circulation. Respiratory: Normal respiratory effort.  No retractions. Slightly diminished breath sounds in the left lung fields. Gastrointestinal: Soft and nontender. No distention.  No CVA tenderness. Genitourinary: deferred Musculoskeletal: No lower extremity tenderness nor edema.  No joint effusions. Neurologic:  Normal speech and language. No gross focal neurologic deficits are appreciated. No gait instability. Skin:  Skin is warm, dry and intact. No rash noted. Psychiatric: Mood and affect are normal. Speech and behavior are normal.  ____________________________________________   LABS (all labs ordered are listed, but only abnormal results are displayed)  Labs Reviewed  CBC - Abnormal; Notable for the following:    WBC 15.8 (*)    All other components within normal limits  COMPREHENSIVE METABOLIC PANEL - Abnormal; Notable for the following:    Sodium 129 (*)    Potassium 2.9 (*)    Chloride 89 (*)    Glucose, Bld 122 (*)    Calcium 8.6 (*)    Albumin 3.2 (*)    All other components within normal limits  CULTURE, BLOOD (ROUTINE X 2)  CULTURE, BLOOD (ROUTINE X 2)  TROPONIN I  POCT RAPID STREP A   ____________________________________________  EKG  ED ECG REPORT I, Joanne Gavel, the attending physician, personally viewed and  interpreted this ECG.   Date: 06/27/2015  EKG Time: 17:38  Rate: 80  Rhythm: normal sinus rhythm  Axis: left  Intervals:none  ST&T Change: No acute ST elevation or T-wave slightly flat in the anterior leads however this is nonspecific.  ____________________________________________  RADIOLOGY  CXR IMPRESSION: Mild patchy left lower lobe opacity, suspicious for pneumonia.  ____________________________________________   PROCEDURES  Procedure(s)  performed: None  Critical Care performed: No  ____________________________________________   INITIAL IMPRESSION / ASSESSMENT AND PLAN / ED COURSE  Pertinent labs & imaging results that were available during my care of the patient were reviewed by me and considered in my medical decision making (see chart for details).  Sharon Mcclure is a 74 y.o. female with history of GERD, hypertension, hyperlipidemia who presents for evaluation of diffuse myalgias, fevers, productive cough as well as worsening shortness of breath. Chest x-ray consistent with a left-sided pneumonia which is the most likely cause of her symptoms. Currently with no oxygen requirements however we'll continue to monitor. Screening labs, chest x-ray, point care strep pending. Reassess for disposition.  ----------------------------------------- 8:53 PM on 06/27/2015 ----------------------------------------- Labs reviewed and are notable for significant leukocytosis with white blood cell count 15,000. CMP also notable for hyponatremia and hypokalemia, we'll replete potassium and continue IV fluids. Troponin negative. We'll give Levaquin for pneumonia. Case discussed with hospitalist, Dr. Jannifer Franklin, for admission at this time. ____________________________________________   FINAL CLINICAL IMPRESSION(S) / ED DIAGNOSES  Final diagnoses:  Community acquired pneumonia      Joanne Gavel, MD 06/28/15 2327

## 2015-06-27 NOTE — Progress Notes (Signed)
ANTIBIOTIC CONSULT NOTE - INITIAL  Pharmacy Consult for levofloxacin Indication: pneumonia  Allergies  Allergen Reactions  . Ampicillin Hives, Shortness Of Breath and Swelling  . Penicillins Hives and Shortness Of Breath  . Crestor [Rosuvastatin Calcium]     Burning sensation  . Ezetimibe-Simvastatin   . Niaspan [Niacin Er]     Fatigues, aches.  . Vytorin [Ezetimibe-Simvastatin]   . Zetia [Ezetimibe]   . Zocor [Simvastatin]   . Welchol [Colesevelam Hcl] Other (See Comments)    Severe bloating even on low dose    Patient Measurements: Height: 5\' 2"  (157.5 cm) Weight: 183 lb 9.6 oz (83.28 kg) IBW/kg (Calculated) : 50.1 Adjusted Body Weight:   Vital Signs: Temp: 100 F (37.8 C) (10/30 2251) Temp Source: Oral (10/30 2251) BP: 160/80 mmHg (10/30 2254) Pulse Rate: 82 (10/30 2251) Intake/Output from previous day:   Intake/Output from this shift:    Labs:  Recent Labs  06/27/15 1849  WBC 15.8*  HGB 12.5  PLT 230  CREATININE 0.65   Estimated Creatinine Clearance: 63.6 mL/min (by C-G formula based on Cr of 0.65). No results for input(s): VANCOTROUGH, VANCOPEAK, VANCORANDOM, GENTTROUGH, GENTPEAK, GENTRANDOM, TOBRATROUGH, TOBRAPEAK, TOBRARND, AMIKACINPEAK, AMIKACINTROU, AMIKACIN in the last 72 hours.   Microbiology: No results found for this or any previous visit (from the past 720 hour(s)).  Medical History: Past Medical History  Diagnosis Date  . Insomnia   . Fatty liver   . Vitamin D deficiency   . History of skin cancer     Right arm  . Anxiety   . Hyperlipidemia   . HTN (hypertension)   . Rosacea 11/24/2013  . GERD (gastroesophageal reflux disease)     Medications:  Infusions:  . sodium chloride     Assessment: 74 yof cc SOB and nonproductive cough. ED CXR suspicious for PNA, elevated WBC, starting levofloxacin for PNA.   Goal of Therapy:    Plan:  Expected duration 7 days with resolution of temperature and/or normalization of WBC. Levofloxacin  500 mg IV Q24H will continue to follow and adjust to PO as soon as possible.   Laural Benes, Pharm.D. Clinical Pharmacist 06/27/2015,11:34 PM

## 2015-06-28 LAB — BASIC METABOLIC PANEL
Anion gap: 8 (ref 5–15)
BUN: 8 mg/dL (ref 6–20)
CO2: 27 mmol/L (ref 22–32)
Calcium: 8.4 mg/dL — ABNORMAL LOW (ref 8.9–10.3)
Chloride: 98 mmol/L — ABNORMAL LOW (ref 101–111)
Creatinine, Ser: 0.52 mg/dL (ref 0.44–1.00)
GFR calc Af Amer: >60 mL/min (ref >60)
GFR calc non Af Amer: >60 mL/min (ref >60)
Glucose, Bld: 114 mg/dL — ABNORMAL HIGH (ref 65–99)
Potassium: 4.1 mmol/L (ref 3.5–5.1)
Sodium: 133 mmol/L — ABNORMAL LOW (ref 135–145)

## 2015-06-28 LAB — CBC
HCT: 34.7 % — ABNORMAL LOW (ref 35.0–47.0)
Hemoglobin: 12 g/dL (ref 12.0–16.0)
MCH: 30.2 pg (ref 26.0–34.0)
MCHC: 34.7 g/dL (ref 32.0–36.0)
MCV: 87.1 fL (ref 80.0–100.0)
Platelets: 222 10*3/uL (ref 150–440)
RBC: 3.98 MIL/uL (ref 3.80–5.20)
RDW: 12.9 % (ref 11.5–14.5)
WBC: 14.9 10*3/uL — ABNORMAL HIGH (ref 3.6–11.0)

## 2015-06-28 LAB — EXPECTORATED SPUTUM ASSESSMENT W REFEX TO RESP CULTURE

## 2015-06-28 LAB — INFLUENZA PANEL BY PCR (TYPE A & B)
H1N1 flu by pcr: NOT DETECTED
Influenza A By PCR: NEGATIVE
Influenza B By PCR: NEGATIVE

## 2015-06-28 LAB — EXPECTORATED SPUTUM ASSESSMENT W GRAM STAIN, RFLX TO RESP C

## 2015-06-28 LAB — MAGNESIUM: Magnesium: 1.6 mg/dL — ABNORMAL LOW (ref 1.7–2.4)

## 2015-06-28 LAB — PHOSPHORUS: Phosphorus: 2.4 mg/dL — ABNORMAL LOW (ref 2.5–4.6)

## 2015-06-28 MED ORDER — HYDROCODONE-ACETAMINOPHEN 5-325 MG PO TABS
1.0000 | ORAL_TABLET | ORAL | Status: DC | PRN
Start: 2015-06-28 — End: 2015-06-30
  Administered 2015-06-28: 1 via ORAL
  Filled 2015-06-28: qty 1

## 2015-06-28 MED ORDER — MAGNESIUM SULFATE 2 GM/50ML IV SOLN
2.0000 g | Freq: Once | INTRAVENOUS | Status: AC
  Administered 2015-06-28: 2 g via INTRAVENOUS
  Filled 2015-06-28: qty 50

## 2015-06-28 MED ORDER — ZOLPIDEM TARTRATE 5 MG PO TABS
5.0000 mg | ORAL_TABLET | Freq: Every day | ORAL | Status: DC
  Administered 2015-06-28 – 2015-06-29 (×2): 5 mg via ORAL
  Filled 2015-06-28 (×2): qty 1

## 2015-06-28 MED ORDER — INFLUENZA VAC SPLIT QUAD 0.5 ML IM SUSY
0.5000 mL | PREFILLED_SYRINGE | INTRAMUSCULAR | Status: AC
  Administered 2015-06-29: 0.5 mL via INTRAMUSCULAR
  Filled 2015-06-28: qty 0.5

## 2015-06-28 NOTE — Progress Notes (Signed)
Less cough and congestion  With tessalon and guifenesin. Positive shortness  Of breath. Sputum thin white to yellow. Remains generally weak

## 2015-06-28 NOTE — Progress Notes (Signed)
Patient ID: NANETTA WIEGMAN, female   DOB: [birth year], 74 y.o.   MRN: [id number] [facility] PROGRESS NOTE  PCP: Odette Fraction, MD  HPI/Subjective: Patient states that she is breathing better than when she came in. Still coughing up some greenish phlegm. No chest pain. Lots of cough.  Objective: Filed Vitals:   06/28/15 0840  BP: 131/63  Pulse: 75  Temp: 98.7 F (37.1 C)  Resp:     Filed Weights   06/27/15 1724 06/27/15 2258  Weight: 74.844 kg (165 lb) 83.28 kg (183 lb 9.6 oz)    ROS: Review of Systems  Constitutional: Negative for fever and chills.  Eyes: Negative for blurred vision.  Respiratory: Positive for cough, sputum production and shortness of breath.   Cardiovascular: Negative for chest pain.  Gastrointestinal: Negative for nausea, vomiting, abdominal pain, diarrhea and constipation.  Genitourinary: Negative for dysuria.  Musculoskeletal: Negative for joint pain.  Neurological: Negative for dizziness and headaches.   Exam: Physical Exam  Constitutional: She is oriented to person, place, and time.  HENT:  Nose: No mucosal edema.  Mouth/Throat: No oropharyngeal exudate or posterior oropharyngeal edema.  Eyes: Conjunctivae, EOM and lids are normal. Pupils are equal, round, and reactive to light.  Neck: No JVD present. Carotid bruit is not present. No edema present. No thyroid mass and no thyromegaly present.  Cardiovascular: S1 normal and S2 normal.  Exam reveals no gallop.   No murmur heard. Pulses:      Dorsalis pedis pulses are 2+ on the right side, and 2+ on the left side.  Respiratory: No respiratory distress. She has no wheezes. She has rhonchi in the left lower field. She has no rales.  GI: Soft. Bowel sounds are normal. There is no tenderness.  Musculoskeletal:       Right ankle: She exhibits no swelling.       Left ankle: She exhibits no swelling.  Lymphadenopathy:    She has no cervical adenopathy.  Neurological: She is alert  and oriented to person, place, and time. No cranial nerve deficit.  Skin: Skin is warm. No rash noted. Nails show no clubbing.  Psychiatric: She has a normal mood and affect.    Data Reviewed: Basic Metabolic Panel:  Recent Labs Lab 06/27/15 1849 06/28/15 0406  NA 129* 133*  K 2.9* 4.1  CL 89* 98*  CO2 29 27  GLUCOSE 122* 114*  BUN 9 8  CREATININE 0.65 0.52  CALCIUM 8.6* 8.4*  MG  --  1.6*  PHOS  --  2.4*   Liver Function Tests:  Recent Labs Lab 06/27/15 1849  AST 38  ALT 33  ALKPHOS 83  BILITOT 1.2  PROT 7.0  ALBUMIN 3.2*   CBC:  Recent Labs Lab 06/27/15 1849 06/28/15 0406  WBC 15.8* 14.9*  HGB 12.5 12.0  HCT 36.0 34.7*  MCV 86.7 87.1  PLT 230 222   Cardiac Enzymes:  Recent Labs Lab 06/27/15 1849  TROPONINI <0.03    Studies: Dg Chest 2 View  06/27/2015  CLINICAL DATA:  Cough, body aches, fever EXAM: CHEST  2 VIEW COMPARISON:  11/09/2014 FINDINGS: Mild patchy left lower lobe opacity, suspicious for pneumonia. No pleural effusion or pneumothorax. The heart is normal in size. Degenerative changes of the visualized thoracolumbar spine. Mild lower thoracic compression fracture deformity.a IMPRESSION: Mild patchy left lower lobe opacity, suspicious for pneumonia. Electronically Signed   By: Julian Hy M.D.   On: 06/27/2015 18:07    Scheduled Meds: . aspirin  81 mg Oral Daily  . citalopram  40 mg Oral Daily  . enoxaparin (LOVENOX) injection  40 mg Subcutaneous Q24H  . levofloxacin (LEVAQUIN) IV  500 mg Intravenous Q24H  . magnesium sulfate 1 - 4 g bolus IVPB  2 g Intravenous Once  . omega-3 acid ethyl esters  1 g Oral Daily  . pantoprazole  40 mg Oral BID AC  . sodium chloride  3 mL Intravenous Q12H  . zolpidem  5 mg Oral QHS    Assessment/Plan:  1. Clinical sepsis, left lower lobe pneumonia, fever, leukocytosis. Blood cultures and sputum cultures still pending. IV Levaquin. Influenza swab negative. 2. Hypokalemia, hypomagnesemia,  hyponatremia. I will blame all these electrolyte abnormalities on her outpatient hydrochlorothiazide. Hydrochlorothiazide is not a good medication for this patient. 3. Essential hypertension- blood pressure on the lower side will continue to monitor off medication 4. Gastroesophageal reflux disease without esophagitis on PPI 5. Hyperlipidemia unspecified on omega-3 fatty acid 6. Insomnia- prescribe Ambien.  Code Status:     Code Status Orders        Start     Ordered   06/27/15 2323  Full code   Continuous     06/27/15 2323     Family Communication: Daughter at the bedside home soon Disposition Plan:   Antibiotics:  Levaquin   Time spent: 25 minutes  Loletha Grayer  [facility] [place] Hospitalists

## 2015-06-28 NOTE — Care Management Note (Signed)
Case Management Note  Patient Details  Name: Sharon Mcclure MRN: [id number] Date of Birth: 1941-02-01  Subjective/Objective:                  Met with patient to discuss discharge planning. O2 is new. Her PCP is Dr Dorris Singh of [facility]. Patient drives and works. She denies RNCM needs.  Action/Plan: RNCM to follow for home O2.   Expected Discharge Date:  06/30/15               Expected Discharge Plan:     In-House Referral:     Discharge planning Services  CM Consult  Post Acute Care Choice:    Choice offered to:  Patient  DME Arranged:    DME Agency:     HH Arranged:    [place] Agency:     Status of Service:  In process, will continue to follow  Medicare Important Message Given:    Date Medicare IM Given:    Medicare IM give by:    Date Additional Medicare IM Given:    Additional Medicare Important Message give by:     If discussed at [place] of Stay Meetings, dates discussed:    Additional Comments:  Marshell Garfinkel, RN 06/28/2015, 11:32 AM

## 2015-06-28 NOTE — Progress Notes (Signed)
Heparin sq 

## 2015-06-28 NOTE — Progress Notes (Signed)
Pt with pneumonia. Reports started with fever,chills . Dr Leslye Peer notified. influencza pcr ordered

## 2015-06-29 DIAGNOSIS — A419 Sepsis, unspecified organism: Secondary | ICD-10-CM | POA: Diagnosis not present

## 2015-06-29 MED ORDER — LEVOFLOXACIN IN D5W 750 MG/150ML IV SOLN
750.0000 mg | INTRAVENOUS | Status: DC
  Administered 2015-06-29: 750 mg via INTRAVENOUS
  Filled 2015-06-29 (×2): qty 150

## 2015-06-29 MED ORDER — HYDROCORTISONE 1 % EX CREA
TOPICAL_CREAM | Freq: Three times a day (TID) | CUTANEOUS | Status: DC | PRN
  Administered 2015-06-29: 19:00:00 via TOPICAL
  Filled 2015-06-29: qty 28

## 2015-06-29 NOTE — Progress Notes (Signed)
Patient ID: Sharon Mcclure, female   DOB: 02-04-1941, 74 y.o.   MRN: [id number] [facility] PROGRESS NOTE  PCP: Odette Fraction, MD  HPI/Subjective: Patient still not feeling well. Still with some cough and shortness of breath. Feels worn out.  Objective: Filed Vitals:   06/29/15 0915  BP: 145/73  Pulse: 71  Temp: 99 F (37.2 C)  Resp: 18    Filed Weights   06/27/15 1724 06/27/15 2258  Weight: 74.844 kg (165 lb) 83.28 kg (183 lb 9.6 oz)    ROS: Review of Systems  Constitutional: Negative for fever and chills.  Eyes: Negative for blurred vision.  Respiratory: Positive for cough, sputum production and shortness of breath.   Cardiovascular: Negative for chest pain.  Gastrointestinal: Negative for nausea, vomiting, abdominal pain, diarrhea and constipation.  Genitourinary: Negative for dysuria.  Musculoskeletal: Negative for joint pain.  Neurological: Negative for dizziness and headaches.   Exam: Physical Exam  Constitutional: She is oriented to person, place, and time.  HENT:  Nose: No mucosal edema.  Mouth/Throat: No oropharyngeal exudate or posterior oropharyngeal edema.  Eyes: Conjunctivae, EOM and lids are normal. Pupils are equal, round, and reactive to light.  Neck: No JVD present. Carotid bruit is not present. No edema present. No thyroid mass and no thyromegaly present.  Cardiovascular: S1 normal and S2 normal.  Exam reveals no gallop.   No murmur heard. Pulses:      Dorsalis pedis pulses are 2+ on the right side, and 2+ on the left side.  Respiratory: No respiratory distress. She has no wheezes. She has rhonchi in the left lower field. She has no rales.  GI: Soft. Bowel sounds are normal. There is no tenderness.  Musculoskeletal:       Right ankle: She exhibits no swelling.       Left ankle: She exhibits no swelling.  Lymphadenopathy:    She has no cervical adenopathy.  Neurological: She is alert and oriented to person, place, and time. No  cranial nerve deficit.  Skin: Skin is warm. No rash noted. Nails show no clubbing.  Psychiatric: She has a normal mood and affect.    Data Reviewed: Basic Metabolic Panel:  Recent Labs Lab 06/27/15 1849 06/28/15 0406  NA 129* 133*  K 2.9* 4.1  CL 89* 98*  CO2 29 27  GLUCOSE 122* 114*  BUN 9 8  CREATININE 0.65 0.52  CALCIUM 8.6* 8.4*  MG  --  1.6*  PHOS  --  2.4*   Liver Function Tests:  Recent Labs Lab 06/27/15 1849  AST 38  ALT 33  ALKPHOS 83  BILITOT 1.2  PROT 7.0  ALBUMIN 3.2*   CBC:  Recent Labs Lab 06/27/15 1849 06/28/15 0406  WBC 15.8* 14.9*  HGB 12.5 12.0  HCT 36.0 34.7*  MCV 86.7 87.1  PLT 230 222   Cardiac Enzymes:  Recent Labs Lab 06/27/15 1849  TROPONINI <0.03    Studies: Dg Chest 2 View  06/27/2015  CLINICAL DATA:  Cough, body aches, fever EXAM: CHEST  2 VIEW COMPARISON:  11/09/2014 FINDINGS: Mild patchy left lower lobe opacity, suspicious for pneumonia. No pleural effusion or pneumothorax. The heart is normal in size. Degenerative changes of the visualized thoracolumbar spine. Mild lower thoracic compression fracture deformity.a IMPRESSION: Mild patchy left lower lobe opacity, suspicious for pneumonia. Electronically Signed   By: Julian Hy M.D.   On: 06/27/2015 18:07    Scheduled Meds: . aspirin  81 mg Oral Daily  . citalopram  40 mg Oral Daily  . enoxaparin (LOVENOX) injection  40 mg Subcutaneous Q24H  . levofloxacin (LEVAQUIN) IV  750 mg Intravenous Q24H  . omega-3 acid ethyl esters  1 g Oral Daily  . pantoprazole  40 mg Oral BID AC  . sodium chloride  3 mL Intravenous Q12H  . zolpidem  5 mg Oral QHS    Assessment/Plan:  1. Clinical sepsis, left lower lobe pneumonia, fever, leukocytosis. Blood cultures and sputum cultures are negative so far. Continue IV Levaquin. Influenza swab negative. 2. Hypokalemia, hypomagnesemia, hyponatremia. I will blame all these electrolyte abnormalities on her outpatient hydrochlorothiazide.  Hydrochlorothiazide is not a good medication for this patient. 3. Essential hypertension- blood pressure starting to trend higher. Do not restart hydrochlorothiazide. If blood pressure is higher we'll start Norvasc. 4. Gastroesophageal reflux disease without esophagitis on PPI 5. Hyperlipidemia unspecified on omega-3 fatty acid 6. Insomnia- prescribe Ambien.  Code Status:     Code Status Orders        Start     Ordered   06/27/15 2323  Full code   Continuous     06/27/15 2323     Disposition Plan: Potential home tomorrow.  Antibiotics:  Levaquin   Time spent: 22 minutes  Loletha Grayer  [facility] Hospitalists

## 2015-06-29 NOTE — Care Management Important Message (Signed)
Important Message  Patient Details  Name: Sharon Mcclure MRN: [id number] Date of Birth: 10/24/1940   Medicare Important Message Given:  Yes-second notification given    Darius Bump [name] 06/29/2015, 9:41 AM

## 2015-06-29 NOTE — Progress Notes (Signed)
ANTIBIOTIC CONSULT NOTE - FOLLOW UP  Pharmacy Consult for levofloxacin Indication: pneumonia (CAP)  Allergies  Allergen Reactions  . Ampicillin Hives, Shortness Of Breath and Swelling  . Penicillins Hives and Shortness Of Breath  . Crestor [Rosuvastatin Calcium]     Burning sensation  . Ezetimibe-Simvastatin   . Niaspan [Niacin Er]     Fatigues, aches.  . Vytorin [Ezetimibe-Simvastatin]   . Zetia [Ezetimibe]   . Zocor [Simvastatin]   . Welchol [Colesevelam Hcl] Other (See Comments)    Severe bloating even on low dose    Patient Measurements: Height: 5\' 2"  (157.5 cm) Weight: 183 lb 9.6 oz (83.28 kg) IBW/kg (Calculated) : 50.1  Vital Signs: Temp: 98.6 F (37 C) (11/01 0459) Temp Source: Oral (11/01 0459) BP: 134/72 mmHg (11/01 0459) Pulse Rate: 76 (11/01 0459) Intake/Output from previous day: 10/31 0701 - 11/01 0700 In: 940 [P.O.:840; IV Piggyback:100] Out: -  Intake/Output from this shift:    Labs:  Recent Labs  06/27/15 1849 06/28/15 0406  WBC 15.8* 14.9*  HGB 12.5 12.0  PLT 230 222  CREATININE 0.65 0.52   Estimated Creatinine Clearance: 63.6 mL/min (by C-G formula based on Cr of 0.52). No results for input(s): VANCOTROUGH, VANCOPEAK, VANCORANDOM, GENTTROUGH, GENTPEAK, GENTRANDOM, TOBRATROUGH, TOBRAPEAK, TOBRARND, AMIKACINPEAK, AMIKACINTROU, AMIKACIN in the last 72 hours.   Microbiology: Recent Results (from the past 720 hour(s))  Culture, expectorated sputum-assessment     Status: None   Collection Time: 06/28/15 10:59 AM  Result Value Ref Range Status   Specimen Description SPUTUM  Final   Special Requests NONE  Final   Sputum evaluation THIS SPECIMEN IS ACCEPTABLE FOR SPUTUM CULTURE  Final   Report Status 06/28/2015 FINAL  Final    Anti-infectives    Start     Dose/Rate Route Frequency Ordered Stop   06/29/15 2000  levofloxacin (LEVAQUIN) IVPB 750 mg     750 mg 100 mL/hr over 90 Minutes Intravenous Every 24 hours 06/29/15 0858     06/28/15 2000   levofloxacin (LEVAQUIN) IVPB 500 mg  Status:  Discontinued     500 mg 100 mL/hr over 60 Minutes Intravenous Every 24 hours 06/27/15 2334 06/29/15 0858   06/27/15 2045  levofloxacin (LEVAQUIN) IVPB 750 mg     750 mg 100 mL/hr over 90 Minutes Intravenous  Once 06/27/15 2041 06/27/15 2300      Assessment: Pharmacy is dosing levofloxacin for pneumonia in this 74 year old female. Today is day #3 of antibiotics.   Plan:  Expected duration 5 days with resolution of temperature and/or normalization of WBC  Will increase dose to levofloxacin 750 mg IV daily based on indication and renal function. Pharmacy will continue to monitor and change to PO as soon as possible.  Darylene Price Zevin Nevares 06/29/2015,8:59 AM

## 2015-06-30 LAB — CULTURE, RESPIRATORY

## 2015-06-30 LAB — BASIC METABOLIC PANEL
Anion gap: 8 (ref 5–15)
BUN: 7 mg/dL (ref 6–20)
CO2: 28 mmol/L (ref 22–32)
Calcium: 8.4 mg/dL — ABNORMAL LOW (ref 8.9–10.3)
Chloride: 98 mmol/L — ABNORMAL LOW (ref 101–111)
Creatinine, Ser: 0.65 mg/dL (ref 0.44–1.00)
GFR calc Af Amer: >60 mL/min (ref >60)
GFR calc non Af Amer: >60 mL/min (ref >60)
Glucose, Bld: 115 mg/dL — ABNORMAL HIGH (ref 65–99)
Potassium: 3.5 mmol/L (ref 3.5–5.1)
Sodium: 134 mmol/L — ABNORMAL LOW (ref 135–145)

## 2015-06-30 LAB — CULTURE, RESPIRATORY W GRAM STAIN: Culture: NORMAL

## 2015-06-30 MED ORDER — AMLODIPINE BESYLATE 5 MG PO TABS
2.5000 mg | ORAL_TABLET | Freq: Every day | ORAL | Status: DC
  Administered 2015-06-30: 2.5 mg via ORAL
  Filled 2015-06-30: qty 1

## 2015-06-30 MED ORDER — AMLODIPINE BESYLATE 2.5 MG PO TABS: 2.5000 mg | ORAL_TABLET | Freq: Every day | ORAL | Status: DC

## 2015-06-30 MED ORDER — LEVOFLOXACIN 500 MG PO TABS: 500.0000 mg | ORAL_TABLET | Freq: Every day | ORAL | Status: DC

## 2015-06-30 NOTE — Discharge Summary (Signed)
[facility] NAME: Sharon Mcclure    MR#:  [id number]  DATE OF BIRTH:  [birth year]  DATE OF ADMISSION:  06/27/2015 ADMITTING PHYSICIAN: Lance Coon, MD  DATE OF DISCHARGE: 06/30/2015  PRIMARY CARE PHYSICIAN: Odette Fraction, MD    ADMISSION DIAGNOSIS:  Community acquired pneumonia [J18.9]  DISCHARGE DIAGNOSIS:  Principal Problem:   CAP (community acquired pneumonia) Active Problems:   Anxiety   Hyperlipidemia   HTN (hypertension)   GERD (gastroesophageal reflux disease)   Hyponatremia   Hypokalemia   SECONDARY DIAGNOSIS:   Past Medical History  Diagnosis Date  . Insomnia   . Fatty liver   . Vitamin D deficiency   . History of skin cancer     Right arm  . Anxiety   . Hyperlipidemia   . HTN (hypertension)   . Rosacea 11/24/2013  . GERD (gastroesophageal reflux disease)     HOSPITAL COURSE:   1. Clinical sepsis, left lower lobe pneumonia, leukocytosis- patient was started on Levaquin and is now feeling better than admission. Will complete a course of Levaquin. 2. Hypokalemia, hypomagnesemia, hyponatremia- likely all secondary to her hydrochlorothiazide. This medication was stopped. Electrolytes were replaced during this hospital course. 3. Essential hypertension- blood pressure was on the lower side on presentation. Hydrochlorothiazide was held. Low-dose Norvasc prescribed upon discharge. 4. Gastroesophageal reflux disease without esophagitis continue PPI 5. Hyperlipidemia unspecified- on fish oil 6. Anxiety depression on Celexa  DISCHARGE CONDITIONS:   Satisfactory  CONSULTS OBTAINED:  None  DRUG ALLERGIES:   Allergies  Allergen Reactions  . Ampicillin Hives, Shortness Of Breath and Swelling  . Penicillins Hives and Shortness Of Breath  . Crestor [Rosuvastatin Calcium]     Burning sensation  . Ezetimibe-Simvastatin   . Niaspan [Niacin Er]     Fatigues, aches.  . Vytorin  [Ezetimibe-Simvastatin]   . Zetia [Ezetimibe]   . Zocor [Simvastatin]   . Welchol [Colesevelam Hcl] Other (See Comments)    Severe bloating even on low dose    DISCHARGE MEDICATIONS:   Current Discharge Medication List    START taking these medications   Details  amLODipine (NORVASC) 2.5 MG tablet Take 1 tablet (2.5 mg total) by mouth daily. Qty: 30 tablet, Refills: 0    levofloxacin (LEVAQUIN) 500 MG tablet Take 1 tablet (500 mg total) by mouth at bedtime. Qty: 7 tablet, Refills: 0      CONTINUE these medications which have NOT CHANGED   Details  aspirin 81 MG tablet Take 81 mg by mouth daily.      benzoyl peroxide-erythromycin (BENZAMYCIN) gel APPLY   TOPICALLY TO AFFECTED AREA TWICE DAILY Qty: 50 g, Refills: 2    Calcium Carbonate-Vit D-Min (CALCIUM 1200 PO) Take by mouth daily.      citalopram (CELEXA) 40 MG tablet TAKE ONE TABLET BY MOUTH ONCE DAILY Qty: 30 tablet, Refills: 0    gabapentin (NEURONTIN) 100 MG capsule Take 100 mg by mouth. Take 1-3 capsule by mouth at bedtime    Omega-3 Fatty Acids (FISH OIL) 1000 MG CAPS Take by mouth daily.      omeprazole (PRILOSEC) 20 MG capsule TAKE ONE CAPSULE BY MOUTH TWICE DAILY BEFORE BREAKFAST AND DINNER Qty: 60 capsule, Refills: 0      STOP taking these medications     azithromycin (ZITHROMAX) 250 MG tablet      hydrochlorothiazide (HYDRODIURIL) 25 MG tablet          DISCHARGE INSTRUCTIONS:   Follow-up PMD  one week  If you experience worsening of your admission symptoms, develop shortness of breath, life threatening emergency, suicidal or homicidal thoughts you must seek medical attention immediately by calling 911 or calling your MD immediately  if symptoms less severe.  You Must read complete instructions/literature along with all the possible adverse reactions/side effects for all the Medicines you take and that have been prescribed to you. Take any new Medicines after you have completely understood and accept  all the possible adverse reactions/side effects.   Please note  You were cared for by a hospitalist during your hospital stay. If you have any questions about your discharge medications or the care you received while you were in the hospital after you are discharged, you can call the unit and asked to speak with the hospitalist on call if the hospitalist that took care of you is not available. Once you are discharged, your primary care physician will handle any further medical issues. Please note that NO REFILLS for any discharge medications will be authorized once you are discharged, as it is imperative that you return to your primary care physician (or establish a relationship with a primary care physician if you do not have one) for your aftercare needs so that they can reassess your need for medications and monitor your lab values.    Today   CHIEF COMPLAINT:   Chief Complaint  Patient presents with  . Shortness of Breath    HISTORY OF PRESENT ILLNESS:  Sharon Mcclure  is a 74 y.o. female presented with shortness of breath and found to have a left lower lobe pneumonia   VITAL SIGNS:  Blood pressure 152/70, pulse 73, temperature 98.4 F (36.9 C), temperature source Oral, resp. rate 19, height 5\' 2"  (1.575 m), weight 83.28 kg (183 lb 9.6 oz), SpO2 94 %.    PHYSICAL EXAMINATION:  GENERAL:  74 y.o.-year-old patient lying in the bed with no acute distress.  EYES: Pupils equal, round, reactive to light and accommodation. No scleral icterus. Extraocular muscles intact.  HEENT: Head atraumatic, normocephalic. Oropharynx and nasopharynx clear.  NECK:  Supple, no jugular venous distention. No thyroid enlargement, no tenderness.  LUNGS: Normal breath sounds bilaterally, no wheezing, rales. Rhonchi left base. No use of accessory muscles of respiration.  CARDIOVASCULAR: S1, S2 normal. No murmurs, rubs, or gallops.  ABDOMEN: Soft, non-tender, non-distended. Bowel sounds present. No  organomegaly or mass.  EXTREMITIES: No pedal edema, cyanosis, or clubbing.  NEUROLOGIC: Cranial nerves II through XII are intact. Muscle strength 5/5 in all extremities. Sensation intact. Gait not checked.  PSYCHIATRIC: The patient is alert and oriented x 3.  SKIN: No obvious rash, lesion, or ulcer.   DATA REVIEW:   CBC  Recent Labs Lab 06/28/15 0406  WBC 14.9*  HGB 12.0  HCT 34.7*  PLT 222    Chemistries   Recent Labs Lab 06/27/15 1849 06/28/15 0406 06/30/15 0640  NA 129* 133* 134*  K 2.9* 4.1 3.5  CL 89* 98* 98*  CO2 29 27 28   GLUCOSE 122* 114* 115*  BUN 9 8 7   CREATININE 0.65 0.52 0.65  CALCIUM 8.6* 8.4* 8.4*  MG  --  1.6*  --   AST 38  --   --   ALT 33  --   --   ALKPHOS 83  --   --   BILITOT 1.2  --   --     Cardiac Enzymes  Recent Labs Lab 06/27/15 1849  TROPONINI <0.03  Microbiology Results  Results for orders placed or performed during the hospital encounter of 06/27/15  Blood culture (routine x 2)     Status: None (Preliminary result)   Collection Time: 06/27/15  9:06 PM  Result Value Ref Range Status   Specimen Description BLOOD LEFT ASSIST CONTROL  Final   Special Requests BOTTLES DRAWN AEROBIC AND ANAEROBIC 4CC  Final   Culture NO GROWTH 2 DAYS  Final   Report Status PENDING  Incomplete  Blood culture (routine x 2)     Status: None (Preliminary result)   Collection Time: 06/27/15  9:06 PM  Result Value Ref Range Status   Specimen Description BLOOD LEFT ASSIST CONTROL  Final   Special Requests BOTTLES DRAWN AEROBIC AND ANAEROBIC 4CC  Final   Culture NO GROWTH 2 DAYS  Final   Report Status PENDING  Incomplete  Culture, expectorated sputum-assessment     Status: None   Collection Time: 06/28/15 10:59 AM  Result Value Ref Range Status   Specimen Description SPUTUM  Final   Special Requests NONE  Final   Sputum evaluation THIS SPECIMEN IS ACCEPTABLE FOR SPUTUM CULTURE  Final   Report Status 06/28/2015 FINAL  Final  Culture, respiratory  (NON-Expectorated)     Status: None (Preliminary result)   Collection Time: 06/28/15 10:59 AM  Result Value Ref Range Status   Specimen Description SPUTUM  Final   Special Requests NONE Reflexed from [id number]  Final   Gram Stain PENDING  Incomplete   Culture APPEARS TO BE NORMAL RESPIRATORY FLORA  Final   Report Status PENDING  Incomplete    Management plans discussed with the patient, family and they are in agreement.  CODE STATUS:     Code Status Orders        Start     Ordered   06/27/15 2323  Full code   Continuous     06/27/15 2323      TOTAL TIME TAKING CARE OF THIS PATIENT: 35 minutes.    Loletha Grayer M.D on 06/30/2015 at 8:53 AM  Between 7am to 6pm - Pager - [phone]  After 6pm go to [email or web address] - password [email or web address] [facility]  [facility]  Office  [phone]  CC: Primary care physician; Odette Fraction, MD

## 2015-06-30 NOTE — Progress Notes (Signed)
Pt d/c home; d/c instructions reviewed w/ pt; pt understanding was verbalized; IV removed catheter in tact, gauze dressing applied; all pt questions answered; pt left unit via wheelchair accompanied by staff 

## 2015-06-30 NOTE — Discharge Instructions (Signed)
Follow all MD discharge instructions. Take all medications as prescribed. Keep all follow up appointments. If your symptoms return, call your doctor. If you experience any new symptoms that are of concern to you or that are bothersome to you, call your doctor. For all questions and/or concerns, call your doctor.   If you have a medical emergency, call 911   Community-Acquired Pneumonia, Adult Pneumonia is an infection of the lungs. There are different types of pneumonia. One type can develop while a person is in a hospital. A different type, called community-acquired pneumonia, develops in people who are not, or have not recently been, in the hospital or other health care facility.  CAUSES Pneumonia may be caused by bacteria, viruses, or funguses. Community-acquired pneumonia is often caused by Streptococcus pneumonia bacteria. These bacteria are often passed from one person to another by breathing in droplets from the cough or sneeze of an infected person. RISK FACTORS The condition is more likely to develop in:  People who havechronic diseases, such as chronic obstructive pulmonary disease (COPD), asthma, congestive heart failure, cystic fibrosis, diabetes, or kidney disease.  People who haveearly-stage or late-stage HIV.  People who havesickle cell disease.  People who havehad their spleen removed (splenectomy).  People who havepoor Human resources officer.  People who havemedical conditions that increase the risk of breathing in (aspirating) secretions their own mouth and nose.   People who havea weakened immune system (immunocompromised).  People who smoke.  People whotravel to areas where pneumonia-causing germs commonly exist.  People whoare around animal habitats or animals that have pneumonia-causing germs, including birds, bats, rabbits, cats, and farm animals. SYMPTOMS Symptoms of this condition include:  Adry cough.  A wet (productive)  cough.  Fever.  Sweating.  Chest pain, especially when breathing deeply or coughing.  Rapid breathing or difficulty breathing.  Shortness of breath.  Shaking chills.  Fatigue.  Muscle aches. DIAGNOSIS Your health care provider will take a medical history and perform a physical exam. You may also have other tests, including:  Imaging studies of your chest, including X-rays.  Tests to check your blood oxygen level and other blood gases.  Other tests on blood, mucus (sputum), fluid around your lungs (pleural fluid), and urine. If your pneumonia is severe, other tests may be done to identify the specific cause of your illness. TREATMENT The type of treatment that you receive depends on many factors, such as the cause of your pneumonia, the medicines you take, and other medical conditions that you have. For most adults, treatment and recovery from pneumonia may occur at home. In some cases, treatment must happen in a hospital. Treatment may include:  Antibiotic medicines, if the pneumonia was caused by bacteria.  Antiviral medicines, if the pneumonia was caused by a virus.  Medicines that are given by mouth or through an IV tube.  Oxygen.  Respiratory therapy. Although rare, treating severe pneumonia may include:  Mechanical ventilation. This is done if you are not breathing well on your own and you cannot maintain a safe blood oxygen level.  Thoracentesis. This procedureremoves fluid around one lung or both lungs to help you breathe better. HOME CARE INSTRUCTIONS  Take over-the-counter and prescription medicines only as told by your health care provider.  Only takecough medicine if you are losing sleep. Understand that cough medicine can prevent your body's natural ability to remove mucus from your lungs.  If you were prescribed an antibiotic medicine, take it as told by your health care provider. Do not  stop taking the antibiotic even if you start to feel  better.  Sleep in a semi-upright position at night. Try sleeping in a reclining chair, or place a few pillows under your head.  Do not use tobacco products, including cigarettes, chewing tobacco, and e-cigarettes. If you need help quitting, ask your health care provider.  Drink enough water to keep your urine clear or pale yellow. This will help to thin out mucus secretions in your lungs. PREVENTION There are ways that you can decrease your risk of developing community-acquired pneumonia. Consider getting a pneumococcal vaccine if:  You are older than 72 years of age.  You are older than 72 years of age and are undergoing cancer treatment, have chronic lung disease, or have other medical conditions that affect your immune system. Ask your health care provider if this applies to you. There are different types and schedules of pneumococcal vaccines. Ask your health care provider which vaccination option is best for you. You may also prevent community-acquired pneumonia if you take these actions:  Get an influenza vaccine every year. Ask your health care provider which type of influenza vaccine is best for you.  Go to the dentist on a regular basis.  Wash your hands often. Use hand sanitizer if soap and water are not available. SEEK MEDICAL CARE IF:  You have a fever.  You are losing sleep because you cannot control your cough with cough medicine. SEEK IMMEDIATE MEDICAL CARE IF:  You have worsening shortness of breath.  You have increased chest pain.  Your sickness becomes worse, especially if you are an older adult or have a weakened immune system.  You cough up blood.   This information is not intended to replace advice given to you by your health care provider. Make sure you discuss any questions you have with your health care provider.   Document Released: 08/14/2005 Document Revised: 05/05/2015 Document Reviewed: 12/09/2014 Elsevier Interactive Patient Education [employer]

## 2015-07-02 LAB — CULTURE, BLOOD (ROUTINE X 2)
Culture: NO GROWTH
Culture: NO GROWTH

## 2015-07-09 ENCOUNTER — Ambulatory Visit (INDEPENDENT_AMBULATORY_CARE_PROVIDER_SITE_OTHER): Payer: Medicare Other | Admitting: Family Medicine

## 2015-07-09 ENCOUNTER — Encounter: Payer: Self-pay | Admitting: Family Medicine

## 2015-07-09 VITALS — BP 144/98 | HR 74 | Temp 98.3°F | Resp 16 | Wt 177.0 lb

## 2015-07-09 DIAGNOSIS — Z09 Encounter for follow-up examination after completed treatment for conditions other than malignant neoplasm: Secondary | ICD-10-CM | POA: Diagnosis not present

## 2015-07-09 DIAGNOSIS — J189 Pneumonia, unspecified organism: Secondary | ICD-10-CM

## 2015-07-09 DIAGNOSIS — E876 Hypokalemia: Secondary | ICD-10-CM | POA: Diagnosis not present

## 2015-07-09 DIAGNOSIS — I10 Essential (primary) hypertension: Secondary | ICD-10-CM | POA: Diagnosis not present

## 2015-07-09 LAB — COMPLETE METABOLIC PANEL WITHOUT GFR
ALT: 23 U/L (ref 6–29)
AST: 25 U/L (ref 10–35)
Albumin: 3.9 g/dL (ref 3.6–5.1)
Alkaline Phosphatase: 74 U/L (ref 33–130)
BUN: 14 mg/dL (ref 7–25)
CO2: 25 mmol/L (ref 20–31)
Calcium: 9.4 mg/dL (ref 8.6–10.4)
Chloride: 101 mmol/L (ref 98–110)
Creat: 0.72 mg/dL (ref 0.60–0.93)
GFR, Est African American: >89 mL/min
GFR, Est Non African American: 84 mL/min
Glucose, Bld: 81 mg/dL (ref 70–99)
Potassium: 5.1 mmol/L (ref 3.5–5.3)
Sodium: 139 mmol/L (ref 135–146)
Total Bilirubin: 0.4 mg/dL (ref 0.2–1.2)
Total Protein: 6.9 g/dL (ref 6.1–8.1)

## 2015-07-09 LAB — CBC WITH DIFFERENTIAL/PLATELET
Basophils Absolute: 0.1 10*3/uL (ref 0.0–0.1)
Basophils Relative: 1 % (ref 0–1)
Eosinophils Absolute: 0.1 10*3/uL (ref 0.0–0.7)
Eosinophils Relative: 1 % (ref 0–5)
HCT: 42.4 % (ref 36.0–46.0)
Hemoglobin: 14.5 g/dL (ref 12.0–15.0)
Lymphocytes Relative: 36 % (ref 12–46)
Lymphs Abs: 2.8 10*3/uL (ref 0.7–4.0)
MCH: 30.3 pg (ref 26.0–34.0)
MCHC: 34.2 g/dL (ref 30.0–36.0)
MCV: 88.7 fL (ref 78.0–100.0)
MPV: 8.6 fL (ref 8.6–12.4)
Monocytes Absolute: 0.7 10*3/uL (ref 0.1–1.0)
Monocytes Relative: 9 % (ref 3–12)
Neutro Abs: 4.1 10*3/uL (ref 1.7–7.7)
Neutrophils Relative %: 53 % (ref 43–77)
Platelets: 496 10*3/uL — ABNORMAL HIGH (ref 150–400)
RBC: 4.78 MIL/uL (ref 3.87–5.11)
RDW: 13.6 % (ref 11.5–15.5)
WBC: 7.8 10*3/uL (ref 4.0–10.5)

## 2015-07-09 LAB — MAGNESIUM: Magnesium: 2.2 mg/dL (ref 1.5–2.5)

## 2015-07-09 MED ORDER — AZITHROMYCIN 250 MG PO TABS: ORAL_TABLET | ORAL | Status: DC

## 2015-07-09 NOTE — Progress Notes (Signed)
Subjective:    Patient ID: Sharon Mcclure, female    DOB: August 09, 1941, 74 y.o.   MRN: [id number]  HPI Patient was recently admitted to the hospital with community-acquired pneumonia. I have copied relevant portions of the discharge summary and included them below for my reference:  DATE OF ADMISSION: 10/30/2016ADMITTING PHYSICIAN: Lance Coon, MD  DATE OF DISCHARGE: 06/30/2015  PRIMARY CARE PHYSICIAN: Odette Fraction, MD    ADMISSION DIAGNOSIS:  Community acquired pneumonia [J18.9]  DISCHARGE DIAGNOSIS:  Principal Problem:  CAP (community acquired pneumonia) Active Problems:  Anxiety  Hyperlipidemia  HTN (hypertension)  GERD (gastroesophageal reflux disease)  Hyponatremia  Hypokalemia   SECONDARY DIAGNOSIS:   Past Medical History  Diagnosis Date  . Insomnia   . Fatty liver   . Vitamin D deficiency   . History of skin cancer     Right arm  . Anxiety   . Hyperlipidemia   . HTN (hypertension)   . Rosacea 11/24/2013  . GERD (gastroesophageal reflux disease)     HOSPITAL COURSE:   1. Clinical sepsis, left lower lobe pneumonia, leukocytosis- patient was started on Levaquin and is now feeling better than admission. Will complete a course of Levaquin. 2. Hypokalemia, hypomagnesemia, hyponatremia- likely all secondary to her hydrochlorothiazide. This medication was stopped. Electrolytes were replaced during this hospital course. 3. Essential hypertension- blood pressure was on the lower side on presentation. Hydrochlorothiazide was held. Low-dose Norvasc prescribed upon discharge. 4. Gastroesophageal reflux disease without esophagitis continue PPI 5. Hyperlipidemia unspecified- on fish oil 6. Anxiety depression on Celexa     07/09/15 She is here today for recheck/hospital follow-up.  She typically sees [facility].  She did not start the amlodipine. She is not on hydrochlorothiazide. Her blood pressure is  elevated today at 144/98. She is due to recheck her potassium and magnesium since her hospital discharge. She has never had a problem with electrolytes in the 6 years prior while taking hydrochlorothiazide. I believe this is likely secondary to dehydration and her sepsis. Therefore would be willing to let the patient go back on hydrochlorothiazide in the future if her electrolytes have stabilized. She does not want to take amlodipine. A better option may be Maxzide to avoid the hypokalemia.  Patient was delirious in the hospital and even several days after discharge. She would not sleep at night and would become combative and argumentative. She discontinued Levaquin 3 days early and she attributed to sundowning and delirium to the Levaquin. Today on exam she has faint left basilar crackles. She also reports coughing although it is much better than it was. She denies any fevers or chills or rigors. Past Medical History  Diagnosis Date  . Insomnia   . Fatty liver   . Vitamin D deficiency   . History of skin cancer     Right arm  . Anxiety   . Hyperlipidemia   . HTN (hypertension)   . Rosacea 11/24/2013  . GERD (gastroesophageal reflux disease)    Past Surgical History  Procedure Laterality Date  . Tubal ligation  1981  . Appendectomy     Current Outpatient Prescriptions on File Prior to Visit  Medication Sig Dispense Refill  . amLODipine (NORVASC) 2.5 MG tablet Take 1 tablet (2.5 mg total) by mouth daily. 30 tablet 0  . aspirin 81 MG tablet Take 81 mg by mouth daily.      . benzoyl peroxide-erythromycin (BENZAMYCIN) gel APPLY   TOPICALLY TO AFFECTED AREA TWICE DAILY 50 g 2  .  Calcium Carbonate-Vit D-Min (CALCIUM 1200 PO) Take by mouth daily.      . citalopram (CELEXA) 40 MG tablet TAKE ONE TABLET BY MOUTH ONCE DAILY 30 tablet 0  . gabapentin (NEURONTIN) 100 MG capsule Take 100 mg by mouth. Take 1-3 capsule by mouth at bedtime    . levofloxacin (LEVAQUIN) 500 MG tablet Take 1 tablet (500 mg  total) by mouth at bedtime. 7 tablet 0  . Omega-3 Fatty Acids (FISH OIL) 1000 MG CAPS Take by mouth daily.      Marland Kitchen omeprazole (PRILOSEC) 20 MG capsule TAKE ONE CAPSULE BY MOUTH TWICE DAILY BEFORE BREAKFAST AND DINNER 60 capsule 0   No current facility-administered medications on file prior to visit.   Allergies  Allergen Reactions  . Ampicillin Hives, Shortness Of Breath and Swelling  . Penicillins Hives and Shortness Of Breath  . Crestor [Rosuvastatin Calcium]     Burning sensation  . Ezetimibe-Simvastatin   . Niaspan [Niacin Er]     Fatigues, aches.  . Vytorin [Ezetimibe-Simvastatin]   . Zetia [Ezetimibe]   . Zocor [Simvastatin]   . Welchol [Colesevelam Hcl] Other (See Comments)    Severe bloating even on low dose   Social History   Social History  . Marital Status: Single    Spouse Name: N/A  . Number of Children: N/A  . Years of Education: N/A   Occupational History  . Not on file.   Social History Main Topics  . Smoking status: Former Smoker    Types: Cigarettes  . Smokeless tobacco: Never Used  . Alcohol Use: No  . Drug Use: No  . Sexual Activity: Not on file   Other Topics Concern  . Not on file   Social History Narrative      Review of Systems  All other systems reviewed and are negative.      Objective:   Physical Exam  Constitutional: She appears well-developed and well-nourished. No distress.  Cardiovascular: Normal rate, regular rhythm and normal heart sounds.   Pulmonary/Chest: Effort normal. No respiratory distress. She has no wheezes. She has rales.  Abdominal: Soft. Bowel sounds are normal.  Skin: She is not diaphoretic.  Vitals reviewed.         Assessment & Plan:  Essential hypertension - Plan: COMPLETE METABOLIC PANEL WITH GFR, Magnesium  Hypokalemia - Plan: COMPLETE METABOLIC PANEL WITH GFR, Magnesium  Hypomagnesemia - Plan: COMPLETE METABOLIC PANEL WITH GFR, Magnesium  CAP (community acquired pneumonia) - Plan: CBC with  Differential/Platelet, azithromycin (ZITHROMAX) 250 MG tablet  Hospital discharge follow-up  I do not believe that the patient is clinically ready to discontinue antibiotics. However she does not want to go back on Levaquin because she attributes the sundowning to the Levaquin in the was most likely the infection. She cannot take penicillin due to anaphylactic reaction. Therefore I'm hesitant to start the patient on a cephalosporin. Therefore I'll cover the patient with Zithromax for 5 days to complete treatment for community-acquired pneumonia. Recheck in one week. If her blood pressure remains elevated and her electrolytes have stabilized, I will discontinue hydrochlorothiazide and replace it with Maxzide.

## 2015-07-21 ENCOUNTER — Encounter: Payer: Self-pay | Admitting: Family Medicine

## 2015-07-21 ENCOUNTER — Ambulatory Visit (INDEPENDENT_AMBULATORY_CARE_PROVIDER_SITE_OTHER): Payer: Medicare Other | Admitting: Family Medicine

## 2015-07-21 VITALS — BP 118/90 | HR 80 | Temp 98.6°F | Resp 18 | Wt 176.0 lb

## 2015-07-21 DIAGNOSIS — J189 Pneumonia, unspecified organism: Secondary | ICD-10-CM

## 2015-07-21 DIAGNOSIS — I10 Essential (primary) hypertension: Secondary | ICD-10-CM

## 2015-07-21 LAB — BASIC METABOLIC PANEL WITH GFR
BUN: 12 mg/dL (ref 7–25)
CO2: 30 mmol/L (ref 20–31)
Calcium: 9.2 mg/dL (ref 8.6–10.4)
Chloride: 98 mmol/L (ref 98–110)
Creat: 0.64 mg/dL (ref 0.60–0.93)
GFR, Est African American: >89 mL/min (ref >=60)
GFR, Est Non African American: 89 mL/min (ref >=60)
Glucose, Bld: 69 mg/dL — ABNORMAL LOW (ref 70–99)
Potassium: 4 mmol/L (ref 3.5–5.3)
Sodium: 137 mmol/L (ref 135–146)

## 2015-07-21 NOTE — Progress Notes (Signed)
Subjective:    Patient ID: Sharon Mcclure, female    DOB: October 23, 1940, 74 y.o.   MRN: [id number]  HPI  Patient was recently admitted to the hospital with community-acquired pneumonia. I have copied relevant portions of the discharge summary and included them below for my reference:  DATE OF ADMISSION: 10/30/2016ADMITTING PHYSICIAN: Lance Coon, MD  DATE OF DISCHARGE: 06/30/2015  PRIMARY CARE PHYSICIAN: Odette Fraction, MD    ADMISSION DIAGNOSIS:  Community acquired pneumonia [J18.9]  DISCHARGE DIAGNOSIS:  Principal Problem:  CAP (community acquired pneumonia) Active Problems:  Anxiety  Hyperlipidemia  HTN (hypertension)  GERD (gastroesophageal reflux disease)  Hyponatremia  Hypokalemia   SECONDARY DIAGNOSIS:   Past Medical History  Diagnosis Date  . Insomnia   . Fatty liver   . Vitamin D deficiency   . History of skin cancer     Right arm  . Anxiety   . Hyperlipidemia   . HTN (hypertension)   . Rosacea 11/24/2013  . GERD (gastroesophageal reflux disease)     HOSPITAL COURSE:   1. Clinical sepsis, left lower lobe pneumonia, leukocytosis- patient was started on Levaquin and is now feeling better than admission. Will complete a course of Levaquin. 2. Hypokalemia, hypomagnesemia, hyponatremia- likely all secondary to her hydrochlorothiazide. This medication was stopped. Electrolytes were replaced during this hospital course. 3. Essential hypertension- blood pressure was on the lower side on presentation. Hydrochlorothiazide was held. Low-dose Norvasc prescribed upon discharge. 4. Gastroesophageal reflux disease without esophagitis continue PPI 5. Hyperlipidemia unspecified- on fish oil 6. Anxiety depression on Celexa     07/09/15 She is here today for recheck/hospital follow-up.  She typically sees [facility].  She did not start the amlodipine. She is not on hydrochlorothiazide. Her blood pressure  is elevated today at 144/98. She is due to recheck her potassium and magnesium since her hospital discharge. She has never had a problem with electrolytes in the 6 years prior while taking hydrochlorothiazide. I believe this is likely secondary to dehydration and her sepsis. Therefore would be willing to let the patient go back on hydrochlorothiazide in the future if her electrolytes have stabilized. She does not want to take amlodipine. A better option may be Maxzide to avoid the hypokalemia.  Patient was delirious in the hospital and even several days after discharge. She would not sleep at night and would become combative and argumentative. She discontinued Levaquin 3 days early and she attributed to sundowning and delirium to the Levaquin. Today on exam she has faint left basilar crackles. She also reports coughing although it is much better than it was. She denies any fevers or chills or rigors.  At that time, my plan was: I do not believe that the patient is clinically ready to discontinue antibiotics. However she does not want to go back on Levaquin because she attributes the sundowning to the Levaquin in the was most likely the infection. She cannot take penicillin due to anaphylactic reaction. Therefore I'm hesitant to start the patient on a cephalosporin. Therefore I'll cover the patient with Zithromax for 5 days to complete treatment for community-acquired pneumonia. Recheck in one week. If her blood pressure remains elevated and her electrolytes have stabilized, I will discontinue hydrochlorothiazide and replace it with Maxzide.  07/21/15 The patient discontinued amlodipine and resumed HCTZ.  She is doing much better.  Denies fever, cough improving.  Still has faint left basilar rales.   Office Visit on 07/09/2015  Component Date Value Ref Range Status  . WBC  07/09/2015 7.8  4.0 - 10.5 K/uL Final  . RBC 07/09/2015 4.78  3.87 - 5.11 MIL/uL Final  . Hemoglobin 07/09/2015 14.5  12.0 - 15.0 g/dL  Final  . HCT 07/09/2015 42.4  36.0 - 46.0 % Final  . MCV 07/09/2015 88.7  78.0 - 100.0 fL Final  . MCH 07/09/2015 30.3  26.0 - 34.0 pg Final  . MCHC 07/09/2015 34.2  30.0 - 36.0 g/dL Final  . RDW 07/09/2015 13.6  11.5 - 15.5 % Final  . Platelets 07/09/2015 496* 150 - 400 K/uL Final  . MPV 07/09/2015 8.6  8.6 - 12.4 fL Final  . Neutrophils Relative % 07/09/2015 53  43 - 77 % Final  . Neutro Abs 07/09/2015 4.1  1.7 - 7.7 K/uL Final  . Lymphocytes Relative 07/09/2015 36  12 - 46 % Final  . Lymphs Abs 07/09/2015 2.8  0.7 - 4.0 K/uL Final  . Monocytes Relative 07/09/2015 9  3 - 12 % Final  . Monocytes Absolute 07/09/2015 0.7  0.1 - 1.0 K/uL Final  . Eosinophils Relative 07/09/2015 1  0 - 5 % Final  . Eosinophils Absolute 07/09/2015 0.1  0.0 - 0.7 K/uL Final  . Basophils Relative 07/09/2015 1  0 - 1 % Final  . Basophils Absolute 07/09/2015 0.1  0.0 - 0.1 K/uL Final  . Smear Review 07/09/2015 Criteria for review not met   Final  . Sodium 07/09/2015 139  135 - 146 mmol/L Final  . Potassium 07/09/2015 5.1  3.5 - 5.3 mmol/L Final  . Chloride 07/09/2015 101  98 - 110 mmol/L Final  . CO2 07/09/2015 25  20 - 31 mmol/L Final  . Glucose, Bld 07/09/2015 81  70 - 99 mg/dL Final  . BUN 07/09/2015 14  7 - 25 mg/dL Final  . Creat 07/09/2015 0.72  0.60 - 0.93 mg/dL Final  . Total Bilirubin 07/09/2015 0.4  0.2 - 1.2 mg/dL Final  . Alkaline Phosphatase 07/09/2015 74  33 - 130 U/L Final  . AST 07/09/2015 25  10 - 35 U/L Final  . ALT 07/09/2015 23  6 - 29 U/L Final  . Total Protein 07/09/2015 6.9  6.1 - 8.1 g/dL Final  . Albumin 07/09/2015 3.9  3.6 - 5.1 g/dL Final  . Calcium 07/09/2015 9.4  8.6 - 10.4 mg/dL Final  . GFR, Est African American 07/09/2015 >89  >=60 mL/min Final  . GFR, Est Non African American 07/09/2015 84  >=60 mL/min Final   Comment:   The estimated GFR is a calculation valid for adults (>=44 years old) that uses the CKD-EPI algorithm to adjust for age and sex. It is   not to be used  for children, pregnant women, hospitalized patients,    patients on dialysis, or with rapidly changing kidney function. According to the NKDEP, eGFR >89 is normal, 60-89 shows mild impairment, 30-59 shows moderate impairment, 15-29 shows severe impairment and <15 is ESRD.     . Magnesium 07/09/2015 2.2  1.5 - 2.5 mg/dL Final    Past Medical History  Diagnosis Date  . Insomnia   . Fatty liver   . Vitamin D deficiency   . History of skin cancer     Right arm  . Anxiety   . Hyperlipidemia   . HTN (hypertension)   . Rosacea 11/24/2013  . GERD (gastroesophageal reflux disease)    Past Surgical History  Procedure Laterality Date  . Tubal ligation  1981  . Appendectomy     Current  Outpatient Prescriptions on File Prior to Visit  Medication Sig Dispense Refill  . amLODipine (NORVASC) 2.5 MG tablet Take 1 tablet (2.5 mg total) by mouth daily. (Patient not taking: Reported on 07/09/2015) 30 tablet 0  . aspirin 81 MG tablet Take 81 mg by mouth daily.      Marland Kitchen azithromycin (ZITHROMAX) 250 MG tablet 2 tabs poqday1, 1 tab poqday 2-5 6 tablet 0  . benzoyl peroxide-erythromycin (BENZAMYCIN) gel APPLY   TOPICALLY TO AFFECTED AREA TWICE DAILY 50 g 2  . Calcium Carbonate-Vit D-Min (CALCIUM 1200 PO) Take by mouth daily.      . citalopram (CELEXA) 40 MG tablet TAKE ONE TABLET BY MOUTH ONCE DAILY 30 tablet 0  . gabapentin (NEURONTIN) 100 MG capsule Take 100 mg by mouth. Take 1-3 capsule by mouth at bedtime    . Omega-3 Fatty Acids (FISH OIL) 1000 MG CAPS Take by mouth daily.      Marland Kitchen omeprazole (PRILOSEC) 20 MG capsule TAKE ONE CAPSULE BY MOUTH TWICE DAILY BEFORE BREAKFAST AND DINNER 60 capsule 0   No current facility-administered medications on file prior to visit.   Allergies  Allergen Reactions  . Ampicillin Hives, Shortness Of Breath and Swelling  . Penicillins Hives and Shortness Of Breath  . Crestor [Rosuvastatin Calcium]     Burning sensation  . Ezetimibe-Simvastatin   . Niaspan [Niacin  Er]     Fatigues, aches.  . Vytorin [Ezetimibe-Simvastatin]   . Zetia [Ezetimibe]   . Zocor [Simvastatin]   . Welchol [Colesevelam Hcl] Other (See Comments)    Severe bloating even on low dose   Social History   Social History  . Marital Status: Single    Spouse Name: N/A  . Number of Children: N/A  . Years of Education: N/A   Occupational History  . Not on file.   Social History Main Topics  . Smoking status: Former Smoker    Types: Cigarettes  . Smokeless tobacco: Never Used  . Alcohol Use: No  . Drug Use: No  . Sexual Activity: Not on file   Other Topics Concern  . Not on file   Social History Narrative      Review of Systems  All other systems reviewed and are negative.      Objective:   Physical Exam  Constitutional: She appears well-developed and well-nourished. No distress.  Cardiovascular: Normal rate, regular rhythm and normal heart sounds.   Pulmonary/Chest: Effort normal. No respiratory distress. She has no wheezes. She has rales.  Abdominal: Soft. Bowel sounds are normal.  Skin: She is not diaphoretic.  Vitals reviewed.         Assessment & Plan:  Benign essential HTN - Plan: BASIC METABOLIC PANEL WITH GFR  CAP (community acquired pneumonia) - Plan: DG Chest 2 View  Blood pressure is adequate, continue hydrochlorothiazide and discontinue amlodipine. I will recheck a BMP today to monitor her potassium. I would like the patient to go for a chest x-ray in one week to ensure complete and total resolution of her pneumonia. I recommended incentive spirometry and deep breathing exercises to help clear without believe is mainly atelectasis.

## 2015-07-26 ENCOUNTER — Other Ambulatory Visit: Payer: Self-pay | Admitting: Physician Assistant

## 2015-07-26 NOTE — Telephone Encounter (Signed)
Medication refilled per protocol. 

## 2015-08-02 ENCOUNTER — Ambulatory Visit
Admission: RE | Admit: 2015-08-02 | Discharge: 2015-08-02 | Disposition: A | Discharge status: Home / Self Care | Payer: Medicare Other | Source: Ambulatory Visit | Attending: Family Medicine | Admitting: Family Medicine

## 2015-08-02 DIAGNOSIS — J189 Pneumonia, unspecified organism: Secondary | ICD-10-CM

## 2015-08-03 ENCOUNTER — Other Ambulatory Visit: Payer: Self-pay | Admitting: Family Medicine

## 2015-08-03 DIAGNOSIS — R918 Other nonspecific abnormal finding of lung field: Secondary | ICD-10-CM

## 2015-08-06 ENCOUNTER — Ambulatory Visit
Admission: RE | Admit: 2015-08-06 | Discharge: 2015-08-06 | Disposition: A | Discharge status: Home / Self Care | Payer: Medicare Other | Source: Ambulatory Visit | Attending: Family Medicine | Admitting: Family Medicine

## 2015-08-06 DIAGNOSIS — R918 Other nonspecific abnormal finding of lung field: Secondary | ICD-10-CM

## 2015-08-06 MED ORDER — IOPAMIDOL (ISOVUE-300) INJECTION 61%
75.0000 mL | Freq: Once | INTRAVENOUS | Status: AC | PRN
  Administered 2015-08-06: 75 mL via INTRAVENOUS

## 2015-08-09 ENCOUNTER — Ambulatory Visit (INDEPENDENT_AMBULATORY_CARE_PROVIDER_SITE_OTHER): Payer: Medicare Other | Admitting: Family Medicine

## 2015-08-09 ENCOUNTER — Encounter: Payer: Self-pay | Admitting: Family Medicine

## 2015-08-09 VITALS — BP 110/78 | HR 84 | Temp 98.7°F | Resp 18 | Wt 182.0 lb

## 2015-08-09 DIAGNOSIS — J219 Acute bronchiolitis, unspecified: Secondary | ICD-10-CM | POA: Diagnosis not present

## 2015-08-09 MED ORDER — PREDNISONE 20 MG PO TABS: 40.0000 mg | ORAL_TABLET | Freq: Every day | ORAL | Status: DC

## 2015-08-09 MED ORDER — PANTOPRAZOLE SODIUM 40 MG PO TBEC: 40.0000 mg | DELAYED_RELEASE_TABLET | Freq: Two times a day (BID) | ORAL | Status: DC

## 2015-08-09 NOTE — Progress Notes (Signed)
Subjective:    Patient ID: Sharon Mcclure, female    DOB: Aug 15, 1941, 74 y.o.   MRN: [id number]  HPI  Patient was recently admitted to the hospital with community-acquired pneumonia.  07/09/15 She is here today for recheck/hospital follow-up.  She typically sees [facility].  She did not start the amlodipine. She is not on hydrochlorothiazide. Her blood pressure is elevated today at 144/98. She is due to recheck her potassium and magnesium since her hospital discharge. She has never had a problem with electrolytes in the 6 years prior while taking hydrochlorothiazide. I believe this is likely secondary to dehydration and her sepsis. Therefore would be willing to let the patient go back on hydrochlorothiazide in the future if her electrolytes have stabilized. She does not want to take amlodipine. A better option may be Maxzide to avoid the hypokalemia.  Patient was delirious in the hospital and even several days after discharge. She would not sleep at night and would become combative and argumentative. She discontinued Levaquin 3 days early and she attributed to sundowning and delirium to the Levaquin. Today on exam she has faint left basilar crackles. She also reports coughing although it is much better than it was. She denies any fevers or chills or rigors.  At that time, my plan was: I do not believe that the patient is clinically ready to discontinue antibiotics. However she does not want to go back on Levaquin because she attributes the sundowning to the Levaquin in the was most likely the infection. She cannot take penicillin due to anaphylactic reaction. Therefore I'm hesitant to start the patient on a cephalosporin. Therefore I'll cover the patient with Zithromax for 5 days to complete treatment for community-acquired pneumonia. Recheck in one week. If her blood pressure remains elevated and her electrolytes have stabilized, I will discontinue hydrochlorothiazide and replace it with  Maxzide.  07/21/15 The patient discontinued amlodipine and resumed HCTZ.  She is doing much better.  Denies fever, cough improving.  Still has faint left basilar rales.  At that time, my plan was: Blood pressure is adequate, continue hydrochlorothiazide and discontinue amlodipine. I will recheck a BMP today to monitor her potassium. I would like the patient to go for a chest x-ray in one week to ensure complete and total resolution of her pneumonia. I recommended incentive spirometry and deep breathing exercises to help clear without believe is mainly atelectasis.  08/09/15 CXR revealed persistent opacities resulting in a CT scan.  CT results are listed below: IMPRESSION: 1. Mild-to-moderate patchy tree-in-bud opacities and ground-glass centrilobular opacities throughout both lungs, most in keeping with an infectious or inflammatory bronchiolitis. 2. Stable chronic scarring in the right middle lobe and lingula. 3. No acute consolidative airspace disease or lung masses. 4. Three-vessel coronary atherosclerosis. 5. No thoracic adenopathy. 6. Small hiatal hernia. 7. Chronic mild T9 vertebral compression fracture.   she continues to report congestion in her lungs, wheezing, an occasional cough productive of thick mucus. On examination today, I can still appreciate faint left basilar crackles Rales. 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 She is constantly breathing in perfume, chemical irritants such as hairspray, "perm" chemicals raising the question of a chemical irritation as a cause of her bronchiolitis. Past Medical History  Diagnosis Date  . Insomnia   . Fatty liver   . Vitamin D deficiency   . History of skin cancer     Right arm  .  Anxiety   . Hyperlipidemia   . HTN (hypertension)   . Rosacea 11/24/2013  . GERD (gastroesophageal reflux disease)    Past Surgical History  Procedure Laterality Date  . Tubal ligation  1981  .  Appendectomy     Current Outpatient Prescriptions on File Prior to Visit  Medication Sig Dispense Refill  . amLODipine (NORVASC) 2.5 MG tablet Take 1 tablet (2.5 mg total) by mouth daily. (Patient not taking: Reported on 07/21/2015) 30 tablet 0  . aspirin 81 MG tablet Take 81 mg by mouth daily.      . benzoyl peroxide-erythromycin (BENZAMYCIN) gel APPLY   TOPICALLY TO AFFECTED AREA TWICE DAILY 50 g 2  . Calcium Carbonate-Vit D-Min (CALCIUM 1200 PO) Take by mouth daily.      . citalopram (CELEXA) 40 MG tablet TAKE ONE TABLET BY MOUTH ONCE DAILY 30 tablet 0  . gabapentin (NEURONTIN) 100 MG capsule Take 100 mg by mouth. Take 1-3 capsule by mouth at bedtime    . hydrochlorothiazide (HYDRODIURIL) 25 MG tablet Take 25 mg by mouth daily.    . Omega-3 Fatty Acids (FISH OIL) 1000 MG CAPS Take by mouth daily.      Marland Kitchen omeprazole (PRILOSEC) 20 MG capsule TAKE ONE CAPSULE BY MOUTH TWICE DAILY BEFORE BREAKFAST AND DINNER 60 capsule 0   No current facility-administered medications on file prior to visit.   Allergies  Allergen Reactions  . Ampicillin Hives, Shortness Of Breath and Swelling  . Penicillins Hives and Shortness Of Breath  . Crestor [Rosuvastatin Calcium]     Burning sensation  . Ezetimibe-Simvastatin   . Niaspan [Niacin Er]     Fatigues, aches.  . Vytorin [Ezetimibe-Simvastatin]   . Zetia [Ezetimibe]   . Zocor [Simvastatin]   . Welchol [Colesevelam Hcl] Other (See Comments)    Severe bloating even on low dose   Social History   Social History  . Marital Status: Single    Spouse Name: N/A  . Number of Children: N/A  . Years of Education: N/A   Occupational History  . Not on file.   Social History Main Topics  . Smoking status: Former Smoker    Types: Cigarettes  . Smokeless tobacco: Never Used  . Alcohol Use: No  . Drug Use: No  . Sexual Activity: Not on file   Other Topics Concern  . Not on file   Social History Narrative      Review of Systems  All other  systems reviewed and are negative.      Objective:   Physical Exam  Constitutional: She appears well-developed and well-nourished. No distress.  Cardiovascular: Normal rate, regular rhythm and normal heart sounds.   Pulmonary/Chest: Effort normal. No respiratory distress. She has no wheezes. She has rales.  Abdominal: Soft. Bowel sounds are normal.  Skin: She is not diaphoretic.  Vitals reviewed.         Assessment & Plan:  Acute bronchiolitis due to unspecified organism - Plan: predniSONE (DELTASONE) 20 MG tablet, pantoprazole (PROTONIX) 40 MG tablet   at the present time, differential diagnosis  Includes postinfectious bronchiolitis from infection such as mycoplasma or other atypicals. She's been treated with a Z-Pak as well as Levaquin. I will start the patient on a short course of prednisone 40 mg a day for 7 days to try to calm down any residual inflammation. I'm also concerned about possible chemical irritants from her career which may be causing the bronchiolitis. Hopefully the prednisone will also help with that. I  discussed having the patient  Drastically improve the ventilation system in her shop and also possibly wear a respirator. I will also address stomach acid as a potential concern and discontinue omeprazole 20 mg by mouth twice a day and increased to protonix 40 mg by mouth twice a day.   Recheck the patient in one month and also recheck a chest x-ray at that time and if the bronchiolitis persist, I will consult pulmonology

## 2015-08-16 ENCOUNTER — Encounter: Payer: Self-pay | Admitting: Physician Assistant

## 2015-08-16 ENCOUNTER — Ambulatory Visit (INDEPENDENT_AMBULATORY_CARE_PROVIDER_SITE_OTHER): Payer: Medicare Other | Admitting: Physician Assistant

## 2015-08-16 VITALS — BP 120/80 | HR 60 | Temp 98.2°F | Resp 18 | Ht 60.0 in | Wt 181.0 lb

## 2015-08-16 DIAGNOSIS — Z0000 Encounter for general adult medical examination without abnormal findings: Secondary | ICD-10-CM

## 2015-08-16 LAB — COMPLETE METABOLIC PANEL WITH GFR
ALT: 28 U/L (ref 6–29)
AST: 24 U/L (ref 10–35)
Albumin: 4.2 g/dL (ref 3.6–5.1)
Alkaline Phosphatase: 47 U/L (ref 33–130)
BUN: 20 mg/dL (ref 7–25)
CO2: 31 mmol/L (ref 20–31)
Calcium: 9.6 mg/dL (ref 8.6–10.4)
Chloride: 96 mmol/L — ABNORMAL LOW (ref 98–110)
Creat: 0.86 mg/dL (ref 0.60–0.93)
GFR, Est African American: 78 mL/min (ref >=60)
GFR, Est Non African American: 68 mL/min (ref >=60)
Glucose, Bld: 76 mg/dL (ref 70–99)
Potassium: 3.6 mmol/L (ref 3.5–5.3)
Sodium: 139 mmol/L (ref 135–146)
Total Bilirubin: 0.7 mg/dL (ref 0.2–1.2)
Total Protein: 7 g/dL (ref 6.1–8.1)

## 2015-08-16 LAB — CBC WITH DIFFERENTIAL/PLATELET
Basophils Absolute: 0.1 10*3/uL (ref 0.0–0.1)
Basophils Relative: 1 % (ref 0–1)
Eosinophils Absolute: 0.1 10*3/uL (ref 0.0–0.7)
Eosinophils Relative: 1 % (ref 0–5)
HCT: 44.7 % (ref 36.0–46.0)
Hemoglobin: 15.2 g/dL — ABNORMAL HIGH (ref 12.0–15.0)
Lymphocytes Relative: 46 % (ref 12–46)
Lymphs Abs: 4.5 10*3/uL — ABNORMAL HIGH (ref 0.7–4.0)
MCH: 29.8 pg (ref 26.0–34.0)
MCHC: 34 g/dL (ref 30.0–36.0)
MCV: 87.6 fL (ref 78.0–100.0)
MPV: 9.3 fL (ref 8.6–12.4)
Monocytes Absolute: 0.9 10*3/uL (ref 0.1–1.0)
Monocytes Relative: 9 % (ref 3–12)
Neutro Abs: 4.2 10*3/uL (ref 1.7–7.7)
Neutrophils Relative %: 43 % (ref 43–77)
Platelets: 298 10*3/uL (ref 150–400)
RBC: 5.1 MIL/uL (ref 3.87–5.11)
RDW: 13.9 % (ref 11.5–15.5)
WBC: 9.8 10*3/uL (ref 4.0–10.5)

## 2015-08-16 LAB — LIPID PANEL
Cholesterol: 279 mg/dL — ABNORMAL HIGH (ref 125–200)
HDL: 65 mg/dL (ref >=46)
LDL Cholesterol: 175 mg/dL — ABNORMAL HIGH (ref <130)
Total CHOL/HDL Ratio: 4.3 Ratio (ref <=5.0)
Triglycerides: 196 mg/dL — ABNORMAL HIGH (ref <150)
VLDL: 39 mg/dL — ABNORMAL HIGH (ref <30)

## 2015-08-16 LAB — TSH: TSH: 3.604 u[IU]/mL (ref 0.350–4.500)

## 2015-08-16 NOTE — Progress Notes (Signed)
Patient ID: Sharon Mcclure MRN: [id number], DOB: 08-27-1941, 74 y.o. Date of Encounter: 08/16/2015,   Chief Complaint: Physical (CPE)  HPI: 74 y.o. y/o female  here for CPE. Also, here for Medicare physical, subsequent.  She says that she feels like she has been through a lot recently. Was hospitalized with pneumonia. Says that she is still on steroids and is to go for a follow-up x-ray in 3 weeks. Otherwise, no specific complaints or concerns today.   Review of Systems: Consitutional: No fever, chills, fatigue, night sweats, lymphadenopathy. No significant/unexplained weight changes. Eyes: No visual changes, eye redness, or discharge. ENT/Mouth: No ear pain, sore throat, nasal drainage, or sinus pain. Cardiovascular: No chest pressure,heaviness, tightness or squeezing, even with exertion. No increased shortness of breath or dyspnea on exertion.No palpitations, edema, orthopnea, PND. Respiratory: No cough, hemoptysis, SOB, or wheezing. Gastrointestinal: No anorexia, dysphagia, reflux, pain, nausea, vomiting, hematemesis, diarrhea, constipation, BRBPR, or melena. Breast: No mass, nodules, bulging, or retraction. No skin changes or inflammation. No nipple discharge. No lymphadenopathy. Genitourinary: No dysuria, hematuria, incontinence, vaginal discharge, pruritis, burning, abnormal bleeding, or pain. Musculoskeletal: No decreased ROM, No joint pain or swelling. No significant pain in neck, back, or extremities. Skin: No rash, pruritis, or concerning lesions. Neurological: No headache, dizziness, syncope, seizures, tremors, memory loss, coordination problems, or paresthesias. Psychological: No anxiety, depression, hallucinations, SI/HI. Endocrine: No polydipsia, polyphagia, polyuria, or known diabetes.No increased fatigue. No palpitations/rapid heart rate. No significant/unexplained weight change. All other systems were reviewed and are otherwise negative.  Past Medical History    Diagnosis Date  . Insomnia   . Fatty liver   . Vitamin D deficiency   . History of skin cancer     Right arm  . Anxiety   . Hyperlipidemia   . HTN (hypertension)   . Rosacea 11/24/2013  . GERD (gastroesophageal reflux disease)      Past Surgical History  Procedure Laterality Date  . Tubal ligation  1981  . Appendectomy      Home Meds:  Outpatient Prescriptions Prior to Visit  Medication Sig Dispense Refill  . aspirin 81 MG tablet Take 81 mg by mouth daily.      . benzoyl peroxide-erythromycin (BENZAMYCIN) gel APPLY   TOPICALLY TO AFFECTED AREA TWICE DAILY 50 g 2  . Calcium Carbonate-Vit D-Min (CALCIUM 1200 PO) Take by mouth daily.      . citalopram (CELEXA) 40 MG tablet TAKE ONE TABLET BY MOUTH ONCE DAILY 30 tablet 0  . hydrochlorothiazide (HYDRODIURIL) 25 MG tablet Take 25 mg by mouth daily.    . pantoprazole (PROTONIX) 40 MG tablet Take 1 tablet (40 mg total) by mouth 2 (two) times daily. 60 tablet 3  . predniSONE (DELTASONE) 20 MG tablet Take 2 tablets (40 mg total) by mouth daily with breakfast. 14 tablet 0  . amLODipine (NORVASC) 2.5 MG tablet Take 1 tablet (2.5 mg total) by mouth daily. (Patient not taking: Reported on 08/09/2015) 30 tablet 0  . gabapentin (NEURONTIN) 100 MG capsule Take 100 mg by mouth. Reported on 08/16/2015    . Omega-3 Fatty Acids (FISH OIL) 1000 MG CAPS Take by mouth daily. Reported on 08/16/2015    . omeprazole (PRILOSEC) 20 MG capsule TAKE ONE CAPSULE BY MOUTH TWICE DAILY BEFORE BREAKFAST AND DINNER (Patient not taking: Reported on 08/16/2015) 60 capsule 0   No facility-administered medications prior to visit.    Allergies:  Allergies  Allergen Reactions  . Ampicillin Hives, Shortness Of Breath and Swelling  .  Penicillins Hives and Shortness Of Breath  . Crestor [Rosuvastatin Calcium]     Burning sensation  . Ezetimibe-Simvastatin   . Niaspan [Niacin Er]     Fatigues, aches.  . Vytorin [Ezetimibe-Simvastatin]   . Zetia [Ezetimibe]   .  Zocor [Simvastatin]   . Welchol [Colesevelam Hcl] Other (See Comments)    Severe bloating even on low dose    Social History   Social History  . Marital Status: Single    Spouse Name: N/A  . Number of Children: N/A  . Years of Education: N/A   Occupational History  . Not on file.   Social History Main Topics  . Smoking status: Former Smoker    Types: Cigarettes  . Smokeless tobacco: Never Used  . Alcohol Use: No  . Drug Use: No  . Sexual Activity: Not on file   Other Topics Concern  . Not on file   Social History Narrative    Family History  Problem Relation Age of Onset  . Colon cancer Neg Hx   . Stomach cancer Neg Hx   . Heart disease Mother 54    aortic valve- had AVR- died 6 wks after  . Aneurysm Father 83    aortic aneursym  . Heart disease Brother 40    CABG    Physical Exam: Blood pressure 120/80, pulse 60, temperature 98.2 F (36.8 C), temperature source Oral, resp. rate 18, height 5' (1.524 m), weight 181 lb (82.101 kg)., Body mass index is 35.35 kg/(m^2). General: Well developed, well nourished, WF. Appears in no acute distress. HEENT: Normocephalic, atraumatic. Conjunctiva pink, sclera non-icteric. Pupils 2 mm constricting to 1 mm, round, regular, and equally reactive to light and accomodation. EOMI. Internal auditory canal clear. TMs with good cone of light and without pathology. Nasal mucosa pink. Nares are without discharge. No sinus tenderness. Oral mucosa pink.  Pharynx without exudate.   Neck: Supple. Trachea midline. No thyromegaly. Full ROM. No lymphadenopathy.No Carotid Bruits. Lungs: Clear to auscultation bilaterally without wheezes, rales, or rhonchi. Breathing is of normal effort and unlabored. Cardiovascular: RRR with S1 S2. No murmurs, rubs, or gallops. Distal pulses 2+ symmetrically. No carotid or abdominal bruits. Breast: Symmetrical. No masses. Nipples without discharge. Abdomen: Soft, non-tender, non-distended with normoactive bowel  sounds. No hepatosplenomegaly or masses. No rebound/guarding. No CVA tenderness. No hernias.  Genitourinary:  External genitalia without lesions. Vaginal mucosa pink.No discharge present. Cervix pink and without discharge. No cervical tenderness.Normal uterus size. No adnexal mass or tenderness.   Musculoskeletal: Full range of motion and 5/5 strength throughout. Skin: Warm and moist without erythema, ecchymosis, wounds, or rash. Neuro: A+Ox3. CN II-XII grossly intact. Moves all extremities spontaneously. Full sensation throughout. Normal gait. DTR 2+ throughout upper and lower extremities. Finger to nose intact. Psych:  Responds to questions appropriately with a normal affect.   Assessment/Plan:  74 y.o. y/o female here for CPE  1. Medicare annual wellness visit, subsequent See below for further documentation.  2. Visit for preventive health examination  A. Screening Labs: - CBC with Differential/Platelet - COMPLETE METABOLIC PANEL WITH GFR - Lipid panel------NOTE: She has a known history of severe hyperlipidemia but has been intolerant to all cholesterol medications including all statins, Zetia, and WelChol - TSH - VITAMIN D 25 Hydroxy (Vit-D Deficiency, Fractures)  B. Pap: She had Pap smear at physical with me 04/22/14. Pap smear negative at that time. Given age of 71, no further Pap smear indicated. Pelvic exam normal today.  C. Screening Mammogram: When  reviewing her chart the last mammogram in seeing [place] January 2015. Patient is uncertain whether she had one since then. I told her to call and schedule follow-up mammogram.  D. DEXA/BMD:  Last bone density scan was 05/25/14. Was performed in [place] [facility] of the [facility]. Bone density was normal. She has continued on calcium and vitamin D. She has been instructed to do weightbearing exercise. Will recheck vitamin D level today.  E. Colorectal Cancer Screening: Last colonoscopy was 10/06/2005 by Dr. Ardis Hughs at  [facility]. Due for follow-up February 2017. Discussed this with her at her visit 08/16/15. She is aware of that follow-up is due, but she says that she will "think about it".  F. Immunizations:  Influenza:   She received influenza vaccine 06/29/15. Tetanus: This vaccine is not covered by Medicare. Therefore deferred today. Pneumococcal:  She received Pneumovax 23 06/12/2005.  Received Prevnar 13 04/22/14. Zostavax: She received Zostavax here 10/08/2007.  On her AVS today I wrote down 2 items for her to follow-up and these are: --- Scheduling mammogram --- Scheduling colonoscopy  Subjective:   Patient presents for Medicare Annual/Subsequent preventive examination.   Review Past Medical/Family/Social: All of this information is reviewed and documented today in epic tabs and an noted above.  Risk Factors  Current exercise habits:  She works as a Theme park manager. She does not do any formal exercise. Dietary issues discussed: She has been thoroughly educated regarding low cholesterol diet as she has been intolerant to cholesterol medications.  Cardiac risk factors: Obesity , Age  Depression Screen  (Note: if answer to either of the following is "Yes", a more complete depression screening is indicated)  Over the past two weeks, have you felt down, depressed or hopeless? No Over the past two weeks, have you felt little interest or pleasure in doing things? No Have you lost interest or pleasure in daily life? No Do you often feel hopeless? No Do you cry easily over simple problems? No   Activities of Daily Living  In your present state of health, do you have any difficulty performing the following activities?:  Driving? No  Managing money? No  Feeding yourself? No  Getting from bed to chair? No  Climbing a flight of stairs? No  Preparing food and eating?: No  Bathing or showering? No  Getting dressed: No  Getting to the toilet? No  Using the toilet:No  Moving around from place to  place: No  In the past year have you fallen or had a near fall?:No  Are you sexually active? No  Do you have more than one partner? No   Hearing Difficulties: No  Do you often ask people to speak up or repeat themselves? No  Do you experience ringing or noises in your ears? No Do you have difficulty understanding soft or whispered voices? No  Do you feel that you have a problem with memory? No Do you often misplace items? No  Do you feel safe at home? Yes  Cognitive Testing  Alert? Yes Normal Appearance?Yes  Oriented to person? Yes Place? Yes  Time? Yes  Recall of three objects? Yes  Can perform simple calculations? Yes  Displays appropriate judgment?Yes  Can read the correct time from a watch face?Yes   List the Names of Other Physician/Practitioners you currently use:  She goes to the eye doctor once a year and comes here. No other routine medical providers.  Indicate any recent Medical Services you may have received from other than  [facility] providers in the past year (date may be approximate).  She goes to the eye doctor once a year and comes here. No other routine medical providers.  Screening Tests / Date--------------- all of this information is documented above. Colonoscopy                     Zostavax  Mammogram  Influenza Vaccine  Tetanus/tdap    Assessment:    Annual wellness medicare exam   Plan:    During the course of the visit the patient was educated and counseled about appropriate screening and preventive services including:  Screening mammography  Colorectal cancer screening  Shingles vaccine. Prescription given to that she can get the vaccine at the pharmacy or Medicare part D.  Screen + for depression. PHQ- 9 score of 12 (moderate depression). We discussed the options of counseling versus possibly a medication. I encouraged her strongly think about the counseling. She is going through some medical problems currently and her husband is as well Mrs. been  very stressful for her. She says she will think about it. She does have Xanax to use as needed. Though she may benefit from an SSRI for her more depressive type symptoms but she wants to hold off at this time.  I aksed her to please have her cardioloist send records since we have none on file.  Diet review for nutrition referral? Yes ____ Not Indicated __x__  Patient Instructions (the written plan) was given to the patient.  Medicare Attestation  I have personally reviewed:  The patient's medical and social history  Their use of alcohol, tobacco or illicit drugs  Their current medications and supplements  The patient's functional ability including ADLs,fall risks, home safety risks, cognitive, and hearing and visual impairment  Diet and physical activities  Evidence for depression or mood disorders  The patient's weight, height, BMI, and visual acuity have been recorded in the chart. I have made referrals, counseling, and provided education to the patient based on review of the above and I have provided the patient with a written personalized care plan for preventive services.       Signed, [address] [place], [place], [facility] 08/16/2015 9:46 AM

## 2015-08-17 LAB — VITAMIN D 25 HYDROXY (VIT D DEFICIENCY, FRACTURES): Vit D, 25-Hydroxy: 59 ng/mL (ref 30–100)

## 2015-09-06 ENCOUNTER — Other Ambulatory Visit: Payer: Self-pay | Admitting: Physician Assistant

## 2015-09-06 NOTE — Telephone Encounter (Signed)
Medication refilled per protocol. 

## 2015-09-13 ENCOUNTER — Ambulatory Visit (INDEPENDENT_AMBULATORY_CARE_PROVIDER_SITE_OTHER): Payer: Medicare Other | Admitting: Family Medicine

## 2015-09-13 ENCOUNTER — Encounter: Payer: Self-pay | Admitting: Family Medicine

## 2015-09-13 VITALS — BP 128/78 | HR 98 | Temp 98.6°F | Resp 20 | Wt 183.0 lb

## 2015-09-13 DIAGNOSIS — J219 Acute bronchiolitis, unspecified: Secondary | ICD-10-CM

## 2015-09-13 DIAGNOSIS — Z23 Encounter for immunization: Secondary | ICD-10-CM

## 2015-09-13 NOTE — Addendum Note (Signed)
Addended by: Shary Decamp B on: 09/13/2015 02:23 PM   Modules accepted: Orders

## 2015-09-13 NOTE — Progress Notes (Signed)
Subjective:    Patient ID: Sharon Mcclure, female    DOB: Jun 21, 1941, 75 y.o.   MRN: [id number]  HPI  Patient was recently admitted to the hospital with community-acquired pneumonia.  07/09/15 She is here today for recheck/hospital follow-up.  She typically sees [facility].  She did not start the amlodipine. She is not on hydrochlorothiazide. Her blood pressure is elevated today at 144/98. She is due to recheck her potassium and magnesium since her hospital discharge. She has never had a problem with electrolytes in the 6 years prior while taking hydrochlorothiazide. I believe this is likely secondary to dehydration and her sepsis. Therefore would be willing to let the patient go back on hydrochlorothiazide in the future if her electrolytes have stabilized. She does not want to take amlodipine. A better option may be Maxzide to avoid the hypokalemia.  Patient was delirious in the hospital and even several days after discharge. She would not sleep at night and would become combative and argumentative. She discontinued Levaquin 3 days early and she attributed to sundowning and delirium to the Levaquin. Today on exam she has faint left basilar crackles. She also reports coughing although it is much better than it was. She denies any fevers or chills or rigors.  At that time, my plan was: I do not believe that the patient is clinically ready to discontinue antibiotics. However she does not want to go back on Levaquin because she attributes the sundowning to the Levaquin in the was most likely the infection. She cannot take penicillin due to anaphylactic reaction. Therefore I'm hesitant to start the patient on a cephalosporin. Therefore I'll cover the patient with Zithromax for 5 days to complete treatment for community-acquired pneumonia. Recheck in one week. If her blood pressure remains elevated and her electrolytes have stabilized, I will discontinue hydrochlorothiazide and replace it with  Maxzide.  07/21/15 The patient discontinued amlodipine and resumed HCTZ.  She is doing much better.  Denies fever, cough improving.  Still has faint left basilar rales.  At that time, my plan was: Blood pressure is adequate, continue hydrochlorothiazide and discontinue amlodipine. I will recheck a BMP today to monitor her potassium. I would like the patient to go for a chest x-ray in one week to ensure complete and total resolution of her pneumonia. I recommended incentive spirometry and deep breathing exercises to help clear without believe is mainly atelectasis.  08/09/15 CXR revealed persistent opacities resulting in a CT scan.  CT results are listed below: IMPRESSION: 1. Mild-to-moderate patchy tree-in-bud opacities and ground-glass centrilobular opacities throughout both lungs, most in keeping with an infectious or inflammatory bronchiolitis. 2. Stable chronic scarring in the right middle lobe and lingula. 3. No acute consolidative airspace disease or lung masses. 4. Three-vessel coronary atherosclerosis. 5. No thoracic adenopathy. 6. Small hiatal hernia. 7. Chronic mild T9 vertebral compression fracture.   she continues to report congestion in her lungs, wheezing, an occasional cough productive of thick mucus. On examination today, I can still appreciate faint left basilar crackles Rales. 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 She is constantly breathing in perfume, chemical irritants such as hairspray, "perm" chemicals raising the question of a chemical irritation as a cause of her bronchiolitis.  At that time, my plan was:  at the present time, differential diagnosis  Includes postinfectious bronchiolitis from infection such as mycoplasma or other atypicals. She's been treated with a Z-Pak as well as  Levaquin. I will start the patient on a short course of prednisone 40 mg a day for 7 days to try to calm down any residual inflammation. I'm  also concerned about possible chemical irritants from her career which may be causing the bronchiolitis. Hopefully the prednisone will also help with that. I discussed having the patient  Drastically improve the ventilation system in her shop and also possibly wear a respirator. I will also address stomach acid as a potential concern and discontinue omeprazole 20 mg by mouth twice a day and increased to protonix 40 mg by mouth twice a day.   Recheck the patient in one month and also recheck a chest x-ray at that time and if the bronchiolitis persist, I will consult pulmonology  09/13/15 Patient states that her breathing is much improved.   She has installed a ventilation fan in her dizziness to event the hairspray in either organic solvents out of the building. She is wearing a mask at work. She denies any further cough or wheezing. Her breathing is back to her baseline. On examination today, her lungs are now clear without left basilar crackles or rales Past Medical History  Diagnosis Date  . Insomnia   . Fatty liver   . Vitamin D deficiency   . History of skin cancer     Right arm  . Anxiety   . Hyperlipidemia   . HTN (hypertension)   . Rosacea 11/24/2013  . GERD (gastroesophageal reflux disease)    Past Surgical History  Procedure Laterality Date  . Tubal ligation  1981  . Appendectomy     Current Outpatient Prescriptions on File Prior to Visit  Medication Sig Dispense Refill  . aspirin 81 MG tablet Take 81 mg by mouth daily.      . benzoyl peroxide-erythromycin (BENZAMYCIN) gel APPLY   TOPICALLY TO AFFECTED AREA TWICE DAILY 50 g 2  . Calcium Carbonate-Vit D-Min (CALCIUM 1200 PO) Take by mouth daily.      . citalopram (CELEXA) 40 MG tablet Take 1 tablet (40 mg total) by mouth daily. 30 tablet 2  . hydrochlorothiazide (HYDRODIURIL) 25 MG tablet Take 25 mg by mouth daily.    . Multiple Vitamin (MULTIVITAMIN) tablet Take 1 tablet by mouth daily.    . Omega-3 Fatty Acids (FISH OIL) 1000 MG  CAPS Take by mouth daily. Reported on 08/16/2015    . pantoprazole (PROTONIX) 40 MG tablet Take 1 tablet (40 mg total) by mouth 2 (two) times daily. 60 tablet 3  . VITAMIN D, ERGOCALCIFEROL, PO Take 5,000 Units by mouth daily.     No current facility-administered medications on file prior to visit.   Allergies  Allergen Reactions  . Ampicillin Hives, Shortness Of Breath and Swelling  . Penicillins Hives and Shortness Of Breath  . Crestor [Rosuvastatin Calcium]     Burning sensation  . Ezetimibe-Simvastatin   . Niaspan [Niacin Er]     Fatigues, aches.  . Vytorin [Ezetimibe-Simvastatin]   . Zetia [Ezetimibe]   . Zocor [Simvastatin]   . Welchol [Colesevelam Hcl] Other (See Comments)    Severe bloating even on low dose   Social History   Social History  . Marital Status: Single    Spouse Name: N/A  . Number of Children: N/A  . Years of Education: N/A   Occupational History  . Not on file.   Social History Main Topics  . Smoking status: Former Smoker    Types: Cigarettes  . Smokeless tobacco: Never Used  .  Alcohol Use: No  . Drug Use: No  . Sexual Activity: Not on file   Other Topics Concern  . Not on file   Social History Narrative      Review of Systems  All other systems reviewed and are negative.      Objective:   Physical Exam  Constitutional: She appears well-developed and well-nourished. No distress.  Cardiovascular: Normal rate, regular rhythm and normal heart sounds.   Pulmonary/Chest: Effort normal and breath sounds normal. No accessory muscle usage. No respiratory distress. She has no decreased breath sounds. She has no wheezes. She has no rales.  Abdominal: Soft. Bowel sounds are normal.  Skin: She is not diaphoretic.  Vitals reviewed.         Assessment & Plan:  Acute bronchiolitis due to unspecified organism - Plan: DG Chest 2 View  repeat chest x-ray. Clinically the patient has resolved. I believe this bronchiolitis with secondary to  organic solvents or possibly postinfectious. However the present time it sounds  Like it has resolved. If persistent on chest x-ray, I will likely consult pulmonology. Patient received Pneumovax 23 today

## 2015-09-20 ENCOUNTER — Ambulatory Visit
Admission: RE | Admit: 2015-09-20 | Discharge: 2015-09-20 | Disposition: A | Discharge status: Home / Self Care | Payer: Medicare Other | Source: Ambulatory Visit | Attending: Family Medicine | Admitting: Family Medicine

## 2015-09-20 DIAGNOSIS — J219 Acute bronchiolitis, unspecified: Secondary | ICD-10-CM

## 2015-10-15 ENCOUNTER — Encounter: Payer: Self-pay | Admitting: Family Medicine

## 2015-10-15 ENCOUNTER — Ambulatory Visit (INDEPENDENT_AMBULATORY_CARE_PROVIDER_SITE_OTHER): Payer: Medicare Other | Admitting: Family Medicine

## 2015-10-15 VITALS — BP 136/80 | HR 64 | Temp 98.4°F | Resp 18 | Wt 182.0 lb

## 2015-10-15 DIAGNOSIS — D485 Neoplasm of uncertain behavior of skin: Secondary | ICD-10-CM | POA: Diagnosis not present

## 2015-10-15 NOTE — Progress Notes (Signed)
Subjective:    Patient ID: Sharon Mcclure, female    DOB: [birth year], 75 y.o.   MRN: [id number]  HPI  Patient has 3 concerning lesions that she will speak to evaluate. There is a 1 cm lesion on the dorsum of her right forearm that is erythematous papular in nature with telangiectasias and pearly iridescent that could represent a basal cell carcinoma. There is a 0.5 cm erythematous scaly lesion on the dorsum of her left forearm that is difficult to determine but appears to be an actinic keratoses. There is a 1 cm erythematous scaly hard papular lesion on the dorsum of her right shin that represents either a squamous cell carcinoma or possibly an irritated actinic keratosis. Past Medical History  Diagnosis Date  . Insomnia   . Fatty liver   . Vitamin D deficiency   . History of skin cancer     Right arm  . Anxiety   . Hyperlipidemia   . HTN (hypertension)   . Rosacea 11/24/2013  . GERD (gastroesophageal reflux disease)    Past Surgical History  Procedure Laterality Date  . Tubal ligation  1981  . Appendectomy     Current Outpatient Prescriptions on File Prior to Visit  Medication Sig Dispense Refill  . aspirin 81 MG tablet Take 81 mg by mouth daily.      . benzoyl peroxide-erythromycin (BENZAMYCIN) gel APPLY   TOPICALLY TO AFFECTED AREA TWICE DAILY 50 g 2  . Calcium Carbonate-Vit D-Min (CALCIUM 1200 PO) Take by mouth daily.      . citalopram (CELEXA) 40 MG tablet Take 1 tablet (40 mg total) by mouth daily. 30 tablet 2  . hydrochlorothiazide (HYDRODIURIL) 25 MG tablet Take 25 mg by mouth daily.    . Multiple Vitamin (MULTIVITAMIN) tablet Take 1 tablet by mouth daily.    . Omega-3 Fatty Acids (FISH OIL) 1000 MG CAPS Take by mouth daily. Reported on 08/16/2015    . pantoprazole (PROTONIX) 40 MG tablet Take 1 tablet (40 mg total) by mouth 2 (two) times daily. 60 tablet 3  . VITAMIN D, ERGOCALCIFEROL, PO Take 5,000 Units by mouth daily.     No current facility-administered medications  on file prior to visit.   Allergies  Allergen Reactions  . Ampicillin Hives, Shortness Of Breath and Swelling  . Penicillins Hives and Shortness Of Breath  . Crestor [Rosuvastatin Calcium]     Burning sensation  . Ezetimibe-Simvastatin   . Niaspan [Niacin Er]     Fatigues, aches.  . Vytorin [Ezetimibe-Simvastatin]   . Zetia [Ezetimibe]   . Zocor [Simvastatin]   . Welchol [Colesevelam Hcl] Other (See Comments)    Severe bloating even on low dose   Social History   Social History  . Marital Status: Single    Spouse Name: N/A  . Number of Children: N/A  . Years of Education: N/A   Occupational History  . Not on file.   Social History Main Topics  . Smoking status: Former Smoker    Types: Cigarettes  . Smokeless tobacco: Never Used  . Alcohol Use: No  . Drug Use: No  . Sexual Activity: Not on file   Other Topics Concern  . Not on file   Social History Narrative       Review of Systems  All other systems reviewed and are negative.      Objective:   Physical Exam  Cardiovascular: Normal rate, regular rhythm and normal heart sounds.   Pulmonary/Chest: Effort normal and  breath sounds normal.  Vitals reviewed.   Please see the description in the history of present illness      Assessment & Plan:  Neoplasm of uncertain behavior of skin - Plan: Pathology, Pathology, Pathology Report, CANCELED: Pathology  Lesion on the right forearm appears to be a possible basal cell carcinoma. I anesthetized this was 0.1% lidocaine with epinephrine and performed a shave biopsy using sterile technique. The lesion was sent to pathology and labeled container. Hemostasis was achieved with Drysol. The lesion on the left forearm appears to be an irritated actinic keratoses. I anesthetized this was 0.1% lidocaine and performed a shave biopsy and sent the lesion to pathology in a labeled container. The 1 cm lesion on the dorsum of the right shin appears to be either a squamous cell  carcinoma or large actinic keratoses. I anesthetized the lesion was 0.1% lidocaine with epinephrine and performed a shave biopsy using sterile technique. The lesion was sent to pathology in a labeled container.

## 2015-10-19 LAB — PATHOLOGY

## 2015-10-22 ENCOUNTER — Ambulatory Visit (INDEPENDENT_AMBULATORY_CARE_PROVIDER_SITE_OTHER): Payer: Medicare Other | Admitting: Family Medicine

## 2015-10-22 DIAGNOSIS — C44712 Basal cell carcinoma of skin of right lower limb, including hip: Secondary | ICD-10-CM

## 2015-10-22 DIAGNOSIS — C44622 Squamous cell carcinoma of skin of right upper limb, including shoulder: Secondary | ICD-10-CM | POA: Diagnosis not present

## 2015-10-22 DIAGNOSIS — C44619 Basal cell carcinoma of skin of left upper limb, including shoulder: Secondary | ICD-10-CM

## 2015-10-22 NOTE — Progress Notes (Signed)
Subjective:    Patient ID: Sharon Mcclure, female    DOB: [birth year], 75 y.o.   MRN: [id number]  HPI  please see the previous office visit. The lesion on her right arm revealed a squamous cell carcinoma and the margins were involved. The lesion on her left arm was found to be basal cell carcinoma and the margins were involved. Lesion on the right shin was found to be basal cell carcinoma and the margins were involved. She is here today for wider excision of all the lesions. Past Medical History  Diagnosis Date  . Insomnia   . Fatty liver   . Vitamin D deficiency   . History of skin cancer     Right arm  . Anxiety   . Hyperlipidemia   . HTN (hypertension)   . Rosacea 11/24/2013  . GERD (gastroesophageal reflux disease)    Past Surgical History  Procedure Laterality Date  . Tubal ligation  1981  . Appendectomy     Current Outpatient Prescriptions on File Prior to Visit  Medication Sig Dispense Refill  . aspirin 81 MG tablet Take 81 mg by mouth daily.      . benzoyl peroxide-erythromycin (BENZAMYCIN) gel APPLY   TOPICALLY TO AFFECTED AREA TWICE DAILY 50 g 2  . Calcium Carbonate-Vit D-Min (CALCIUM 1200 PO) Take by mouth daily.      . citalopram (CELEXA) 40 MG tablet Take 1 tablet (40 mg total) by mouth daily. 30 tablet 2  . hydrochlorothiazide (HYDRODIURIL) 25 MG tablet Take 25 mg by mouth daily.    . Multiple Vitamin (MULTIVITAMIN) tablet Take 1 tablet by mouth daily.    . Omega-3 Fatty Acids (FISH OIL) 1000 MG CAPS Take by mouth daily. Reported on 08/16/2015    . pantoprazole (PROTONIX) 40 MG tablet Take 1 tablet (40 mg total) by mouth 2 (two) times daily. 60 tablet 3  . VITAMIN D, ERGOCALCIFEROL, PO Take 5,000 Units by mouth daily.     No current facility-administered medications on file prior to visit.   Allergies  Allergen Reactions  . Ampicillin Hives, Shortness Of Breath and Swelling  . Penicillins Hives and Shortness Of Breath  . Crestor [Rosuvastatin Calcium]    Burning sensation  . Ezetimibe-Simvastatin   . Niaspan [Niacin Er]     Fatigues, aches.  . Vytorin [Ezetimibe-Simvastatin]   . Zetia [Ezetimibe]   . Zocor [Simvastatin]   . Welchol [Colesevelam Hcl] Other (See Comments)    Severe bloating even on low dose   Social History   Social History  . Marital Status: Single    Spouse Name: N/A  . Number of Children: N/A  . Years of Education: N/A   Occupational History  . Not on file.   Social History Main Topics  . Smoking status: Former Smoker    Types: Cigarettes  . Smokeless tobacco: Never Used  . Alcohol Use: No  . Drug Use: No  . Sexual Activity: Not on file   Other Topics Concern  . Not on file   Social History Narrative      Review of Systems  All other systems reviewed and are negative.      Objective:   Physical Exam  Vitals reviewed.    No exam was performed patient is here today for procedure only      Assessment & Plan:  SCC (squamous cell carcinoma), arm, right - Plan: Pathology  BCC (basal cell carcinoma), arm, left - Plan: Pathology  BCC (basal cell carcinoma),  leg, right - Plan: Pathology   each of the 3 lesions was anesthetized with 0.1% lidocaine with epinephrine. Each of the 3 lesions was then prepped and draped in sterile fashion. The lesion on the right arm was excised first performed by using a 3 cm x 2 cm elliptical excision down to the subcutaneous fat. The lesion was then closed with 3 simple interrupted Ethilon sutures. The lesion was sent to pathology in a labeled container. There was minimal blood loss. Lesion on the right leg was excised using a 4 cm x 3 cm elliptical excision down to the subcutaneous fat. The skin edges were then approximated using 5 simple interrupted Ethilon sutures. Blood loss was minimal and the lesion was sent to pathology in a labeled container. Next the lesion was removed from the left arm. A 2 cm x 3 cm elliptical excision was performed down to the subcutaneous  fat and the skin edges were approximated using 4 simple interrupted Ethilon sutures. Estimated blood loss was minimal  And the lesion was sent to pathology and labeled container. Stitches out in 10 days.

## 2015-10-26 LAB — PATHOLOGY

## 2015-11-01 ENCOUNTER — Ambulatory Visit (INDEPENDENT_AMBULATORY_CARE_PROVIDER_SITE_OTHER): Payer: Medicare Other | Admitting: Family Medicine

## 2015-11-01 DIAGNOSIS — Z4802 Encounter for removal of sutures: Secondary | ICD-10-CM

## 2015-11-01 NOTE — Progress Notes (Signed)
   Subjective:    Patient ID: Sharon Mcclure, female    DOB: 04-30-1941, 75 y.o.   MRN: [id number]  HPI    Review of Systems     Objective:   Physical Exam        Assessment & Plan:  Please see the last office visit. All stitches removed without complication. Surgical sites reinforced with Steri-Strips. Biopsies confirmed clear margins in all 3 areas.

## 2015-11-05 ENCOUNTER — Ambulatory Visit (INDEPENDENT_AMBULATORY_CARE_PROVIDER_SITE_OTHER): Payer: Medicare Other | Admitting: Family Medicine

## 2015-11-05 ENCOUNTER — Encounter: Payer: Self-pay | Admitting: Family Medicine

## 2015-11-05 VITALS — BP 144/84 | HR 74 | Temp 97.9°F | Resp 18 | Wt 182.0 lb

## 2015-11-05 DIAGNOSIS — L600 Ingrowing nail: Secondary | ICD-10-CM

## 2015-11-05 NOTE — Progress Notes (Signed)
Subjective:    Patient ID: Sharon Mcclure, female    DOB: 09-04-1940, 75 y.o.   MRN: [id number]  HPI  patient presents with a several month history of an ingrown painful toenail on her right second toe on the medial nail margin. There is no evidence of infection. She would like the toenail portion that is ingrown removed. She would also like an ablation so that it does not come back. Past Medical History  Diagnosis Date  . Insomnia   . Fatty liver   . Vitamin D deficiency   . History of skin cancer     Right arm  . Anxiety   . Hyperlipidemia   . HTN (hypertension)   . Rosacea 11/24/2013  . GERD (gastroesophageal reflux disease)    Past Surgical History  Procedure Laterality Date  . Tubal ligation  1981  . Appendectomy     Current Outpatient Prescriptions on File Prior to Visit  Medication Sig Dispense Refill  . aspirin 81 MG tablet Take 81 mg by mouth daily.      . benzoyl peroxide-erythromycin (BENZAMYCIN) gel APPLY   TOPICALLY TO AFFECTED AREA TWICE DAILY 50 g 2  . Calcium Carbonate-Vit D-Min (CALCIUM 1200 PO) Take by mouth daily.      . citalopram (CELEXA) 40 MG tablet Take 1 tablet (40 mg total) by mouth daily. 30 tablet 2  . hydrochlorothiazide (HYDRODIURIL) 25 MG tablet Take 25 mg by mouth daily.    . Multiple Vitamin (MULTIVITAMIN) tablet Take 1 tablet by mouth daily.    . Omega-3 Fatty Acids (FISH OIL) 1000 MG CAPS Take by mouth daily. Reported on 08/16/2015    . pantoprazole (PROTONIX) 40 MG tablet Take 1 tablet (40 mg total) by mouth 2 (two) times daily. 60 tablet 3  . VITAMIN D, ERGOCALCIFEROL, PO Take 5,000 Units by mouth daily.     No current facility-administered medications on file prior to visit.   Allergies  Allergen Reactions  . Ampicillin Hives, Shortness Of Breath and Swelling  . Penicillins Hives and Shortness Of Breath  . Crestor [Rosuvastatin Calcium]     Burning sensation  . Ezetimibe-Simvastatin   . Niaspan [Niacin Er]     Fatigues, aches.  .  Vytorin [Ezetimibe-Simvastatin]   . Zetia [Ezetimibe]   . Zocor [Simvastatin]   . Welchol [Colesevelam Hcl] Other (See Comments)    Severe bloating even on low dose   Social History   Social History  . Marital Status: Single    Spouse Name: N/A  . Number of Children: N/A  . Years of Education: N/A   Occupational History  . Not on file.   Social History Main Topics  . Smoking status: Former Smoker    Types: Cigarettes  . Smokeless tobacco: Never Used  . Alcohol Use: No  . Drug Use: No  . Sexual Activity: Not on file   Other Topics Concern  . Not on file   Social History Narrative      Review of Systems  All other systems reviewed and are negative.      Objective:   Physical Exam  Cardiovascular: Normal rate, regular rhythm and normal heart sounds.   Pulmonary/Chest: Effort normal and breath sounds normal.  Vitals reviewed.  Medial nail margin of the right second toe is ingrown.        Assessment & Plan:  Ingrown toenail without infection Toe was anesthetized with a digital block using 0.1% lidocaine. Approximately 5 mL of lidocaine was used  in total. A tourniquet was applied to the base of the toe. The ingrown portion of the toenail was then elevated using an elevator and a longitudinal cut was made along the length of the toenail to the proximal portion. The entire ingrown portion of the toenail was then removed with simple traction. Phenol was then applied to the underlying nailbed in an effort to destroy the  Nail matrix and prevent recurrence. After 10 seconds the phenol was pinched vigorously with isopropyl alcohol. Neosporin was then applied to the nail bed and the nail was wrapped and dressed with petroleum gauze and [place] and. The tourniquet was removed. Total tourniquet time was less than 5 minutes. Minimal blood loss. When care was discussed

## 2015-11-06 ENCOUNTER — Other Ambulatory Visit: Payer: Self-pay | Admitting: Physician Assistant

## 2015-11-08 NOTE — Telephone Encounter (Signed)
Medication refilled per protocol. 

## 2015-11-18 ENCOUNTER — Encounter: Payer: Self-pay | Admitting: Gastroenterology

## 2015-11-29 ENCOUNTER — Telehealth: Payer: Self-pay | Admitting: Family Medicine

## 2015-11-29 NOTE — Telephone Encounter (Signed)
Patient states that she was given amlodipine 2.5 mg called into [facility] on [address]. In [place]. She would like to take this medication instead of hydrochlorothiazide (HYDRODIURIL) 25 MG tablet. She states that she feels better since she was started on the amlodipine in the hospital and she hasn't been taken the hydrochlorothiazide for a while now.   CB# [phone]

## 2015-11-30 NOTE — Telephone Encounter (Signed)
Ok with DC hctz and replacing with amlodipine but I would increase to 5 mg a day

## 2015-12-01 MED ORDER — AMLODIPINE BESYLATE 5 MG PO TABS: 5.0000 mg | ORAL_TABLET | Freq: Every day | ORAL | Status: DC

## 2015-12-01 NOTE — Telephone Encounter (Signed)
Patient aware of providers recommendations. Med called to pharm

## 2015-12-18 ENCOUNTER — Other Ambulatory Visit: Payer: Self-pay | Admitting: Family Medicine

## 2016-01-21 ENCOUNTER — Encounter: Payer: Self-pay | Admitting: *Deleted

## 2016-01-25 ENCOUNTER — Ambulatory Visit: Payer: Medicare Other | Admitting: Anesthesiology

## 2016-01-25 ENCOUNTER — Encounter
Admission: RE | Disposition: A | Discharge status: Home / Self Care | Payer: Self-pay | Source: Ambulatory Visit | Attending: Ophthalmology

## 2016-01-25 ENCOUNTER — Ambulatory Visit
Admission: RE | Admit: 2016-01-25 | Discharge: 2016-01-25 | Disposition: A | Discharge status: Home / Self Care | Payer: Medicare Other | Source: Ambulatory Visit | Attending: Ophthalmology | Admitting: Ophthalmology

## 2016-01-25 DIAGNOSIS — Z7982 Long term (current) use of aspirin: Secondary | ICD-10-CM | POA: Insufficient documentation

## 2016-01-25 DIAGNOSIS — Z9851 Tubal ligation status: Secondary | ICD-10-CM | POA: Insufficient documentation

## 2016-01-25 DIAGNOSIS — Z79899 Other long term (current) drug therapy: Secondary | ICD-10-CM | POA: Insufficient documentation

## 2016-01-25 DIAGNOSIS — Z85828 Personal history of other malignant neoplasm of skin: Secondary | ICD-10-CM | POA: Diagnosis not present

## 2016-01-25 DIAGNOSIS — H269 Unspecified cataract: Secondary | ICD-10-CM | POA: Diagnosis present

## 2016-01-25 DIAGNOSIS — I10 Essential (primary) hypertension: Secondary | ICD-10-CM | POA: Diagnosis not present

## 2016-01-25 DIAGNOSIS — F419 Anxiety disorder, unspecified: Secondary | ICD-10-CM | POA: Insufficient documentation

## 2016-01-25 DIAGNOSIS — L719 Rosacea, unspecified: Secondary | ICD-10-CM | POA: Insufficient documentation

## 2016-01-25 DIAGNOSIS — K760 Fatty (change of) liver, not elsewhere classified: Secondary | ICD-10-CM | POA: Diagnosis not present

## 2016-01-25 DIAGNOSIS — Z880 Allergy status to penicillin: Secondary | ICD-10-CM | POA: Insufficient documentation

## 2016-01-25 DIAGNOSIS — E559 Vitamin D deficiency, unspecified: Secondary | ICD-10-CM | POA: Insufficient documentation

## 2016-01-25 DIAGNOSIS — G4700 Insomnia, unspecified: Secondary | ICD-10-CM | POA: Insufficient documentation

## 2016-01-25 DIAGNOSIS — K219 Gastro-esophageal reflux disease without esophagitis: Secondary | ICD-10-CM | POA: Insufficient documentation

## 2016-01-25 DIAGNOSIS — Z888 Allergy status to other drugs, medicaments and biological substances status: Secondary | ICD-10-CM | POA: Insufficient documentation

## 2016-01-25 DIAGNOSIS — H2512 Age-related nuclear cataract, left eye: Secondary | ICD-10-CM | POA: Diagnosis not present

## 2016-01-25 DIAGNOSIS — E785 Hyperlipidemia, unspecified: Secondary | ICD-10-CM | POA: Diagnosis not present

## 2016-01-25 DIAGNOSIS — Z9049 Acquired absence of other specified parts of digestive tract: Secondary | ICD-10-CM | POA: Diagnosis not present

## 2016-01-25 DIAGNOSIS — Z87891 Personal history of nicotine dependence: Secondary | ICD-10-CM | POA: Insufficient documentation

## 2016-01-25 HISTORY — PX: CATARACT EXTRACTION W/PHACO: SHX586

## 2016-01-25 HISTORY — DX: Pneumonia, unspecified organism: J18.9

## 2016-01-25 SURGERY — PHACOEMULSIFICATION, CATARACT, WITH IOL INSERTION
Anesthesia: Monitor Anesthesia Care | Site: Eye | Laterality: Left | Wound class: Clean

## 2016-01-25 MED ORDER — POVIDONE-IODINE 5 % OP SOLN
1.0000 "application " | Freq: Once | OPHTHALMIC | Status: AC
  Administered 2016-01-25: 1 via OPHTHALMIC

## 2016-01-25 MED ORDER — MIDAZOLAM HCL 5 MG/5ML IJ SOLN
INTRAMUSCULAR | Status: DC | PRN
  Administered 2016-01-25: 1 mg via INTRAVENOUS

## 2016-01-25 MED ORDER — SODIUM CHLORIDE 0.9 % IV SOLN
INTRAVENOUS | Status: DC
  Administered 2016-01-25 (×2): via INTRAVENOUS

## 2016-01-25 MED ORDER — EPINEPHRINE HCL 1 MG/ML IJ SOLN
INTRAMUSCULAR | Status: AC
  Filled 2016-01-25: qty 1

## 2016-01-25 MED ORDER — POVIDONE-IODINE 5 % OP SOLN
OPHTHALMIC | Status: AC
  Administered 2016-01-25: 1 via OPHTHALMIC
  Filled 2016-01-25: qty 30

## 2016-01-25 MED ORDER — EPINEPHRINE HCL 1 MG/ML IJ SOLN
INTRAOCULAR | Status: DC | PRN
  Administered 2016-01-25: 12:00:00 via OPHTHALMIC

## 2016-01-25 MED ORDER — FENTANYL CITRATE (PF) 100 MCG/2ML IJ SOLN
INTRAMUSCULAR | Status: DC | PRN
  Administered 2016-01-25: 50 ug via INTRAVENOUS

## 2016-01-25 MED ORDER — MOXIFLOXACIN HCL 0.5 % OP SOLN: 1.0000 [drp] | Freq: Once | OPHTHALMIC | Status: DC

## 2016-01-25 MED ORDER — TETRACAINE HCL 0.5 % OP SOLN
1.0000 [drp] | Freq: Once | OPHTHALMIC | Status: AC
  Administered 2016-01-25: 1 [drp] via OPHTHALMIC

## 2016-01-25 MED ORDER — ARMC OPHTHALMIC DILATING GEL
OPHTHALMIC | Status: AC
  Administered 2016-01-25: 1 via OPHTHALMIC
  Filled 2016-01-25: qty 0.25

## 2016-01-25 MED ORDER — TETRACAINE HCL 0.5 % OP SOLN
OPHTHALMIC | Status: AC
  Administered 2016-01-25: 1 [drp] via OPHTHALMIC
  Filled 2016-01-25: qty 2

## 2016-01-25 MED ORDER — NA CHONDROIT SULF-NA HYALURON 40-17 MG/ML IO SOLN
INTRAOCULAR | Status: DC | PRN
  Administered 2016-01-25: 1 mL via INTRAOCULAR

## 2016-01-25 MED ORDER — NA CHONDROIT SULF-NA HYALURON 40-17 MG/ML IO SOLN
INTRAOCULAR | Status: AC
  Filled 2016-01-25: qty 1

## 2016-01-25 MED ORDER — MOXIFLOXACIN HCL 0.5 % OP SOLN
OPHTHALMIC | Status: DC | PRN
  Administered 2016-01-25: 1 [drp] via OPHTHALMIC

## 2016-01-25 MED ORDER — CARBACHOL 0.01 % IO SOLN
INTRAOCULAR | Status: DC | PRN
  Administered 2016-01-25: 0.5 mL via INTRAOCULAR

## 2016-01-25 MED ORDER — ARMC OPHTHALMIC DILATING GEL
1.0000 "application " | Freq: Once | OPHTHALMIC | Status: AC
  Administered 2016-01-25 (×2): 1 via OPHTHALMIC

## 2016-01-25 MED ORDER — MOXIFLOXACIN HCL 0.5 % OP SOLN
OPHTHALMIC | Status: AC
  Filled 2016-01-25: qty 3

## 2016-01-25 SURGICAL SUPPLY — 22 items
CANNULA ANT/CHMB 27G (MISCELLANEOUS) ×1 IMPLANT
CANNULA ANT/CHMB 27GA (MISCELLANEOUS) ×2 IMPLANT
CUP MEDICINE 2OZ PLAST GRAD ST (MISCELLANEOUS) ×2 IMPLANT
GLOVE BIO SURGEON STRL SZ8 (GLOVE) ×2 IMPLANT
GLOVE BIOGEL M 6.5 STRL (GLOVE) ×2 IMPLANT
GLOVE SURG LX 8.0 MICRO (GLOVE) ×1
GLOVE SURG LX STRL 8.0 MICRO (GLOVE) ×1 IMPLANT
GOWN STRL REUS W/ TWL LRG LVL3 (GOWN DISPOSABLE) ×2 IMPLANT
GOWN STRL REUS W/TWL LRG LVL3 (GOWN DISPOSABLE) ×4
LENS IOL TECNIS ITEC 20.0 (Intraocular Lens) ×1 IMPLANT
PACK CATARACT (MISCELLANEOUS) ×2 IMPLANT
PACK CATARACT BRASINGTON LX (MISCELLANEOUS) ×2 IMPLANT
PACK EYE AFTER SURG (MISCELLANEOUS) ×2 IMPLANT
SOL BSS BAG (MISCELLANEOUS) ×2
SOL PREP PVP 2OZ (MISCELLANEOUS) ×2
SOLUTION BSS BAG (MISCELLANEOUS) ×1 IMPLANT
SOLUTION PREP PVP 2OZ (MISCELLANEOUS) ×1 IMPLANT
SYR 3ML LL SCALE MARK (SYRINGE) ×2 IMPLANT
SYR 5ML LL (SYRINGE) ×2 IMPLANT
SYR TB 1ML 27GX1/2 LL (SYRINGE) ×2 IMPLANT
WATER STERILE IRR 1000ML POUR (IV SOLUTION) ×2 IMPLANT
WIPE NON LINTING 3.25X3.25 (MISCELLANEOUS) ×2 IMPLANT

## 2016-01-25 NOTE — H&P (Signed)
  All labs reviewed. Abnormal studies sent to patients PCP when indicated.  Previous H&P reviewed, patient examined, there are NO CHANGES.  Sharon Jurgens LOUIS5/30/201711:55 AM

## 2016-01-25 NOTE — Discharge Instructions (Signed)
Eye Surgery Discharge Instructions  Expect mild scratchy sensation or mild soreness. DO NOT RUB YOUR EYE!  The day of surgery:  Minimal physical activity, but bed rest is not required  No reading, computer work, or close hand work  No bending, lifting, or straining.  May watch TV  For 24 hours:  No driving, legal decisions, or alcoholic beverages  Safety precautions  Eat anything you prefer: It is better to start with liquids, then soup then solid foods.  _____ Eye patch should be worn until postoperative exam tomorrow.  ____ Solar shield eyeglasses should be worn for comfort in the sunlight/patch while sleeping  Resume all regular medications including aspirin or Coumadin if these were discontinued prior to surgery. You may shower, bathe, shave, or wash your hair. Tylenol may be taken for mild discomfort.  Call your doctor if you experience significant pain, nausea, or vomiting, fever > 101 or other signs of infection. [phone] or [phone] Specific instructions:  Follow-up Information    Follow up with Tim Lair, MD.   Specialty:  Ophthalmology   Why:  May 31 at 10:45am   Contact information:   [place] [address] [phone]

## 2016-01-25 NOTE — Transfer of Care (Signed)
Immediate Anesthesia Transfer of Care Note  Patient: Sharon Mcclure  Procedure(s) Performed: Procedure(s) with comments: CATARACT EXTRACTION PHACO AND INTRAOCULAR LENS PLACEMENT (IOC) (Left) - [place] 33.9 AP% 15.5 CDE 5.28 Fluid Pack Lot # [id number]  Patient Location: PACU  Anesthesia Type:MAC  Level of Consciousness: awake, alert  and oriented  Airway & Oxygen Therapy: Patient Spontanous Breathing  Post-op Assessment: Report given to RN and Post -op Vital signs reviewed and stable  Post vital signs: Reviewed and stable  Last Vitals:  Filed Vitals:   01/25/16 1111  BP: 147/78  Pulse: 64  Temp: 36.1 C  Resp: 16    Last Pain: There were no vitals filed for this visit.       Complications: No apparent anesthesia complications

## 2016-01-25 NOTE — Op Note (Signed)
PREOPERATIVE DIAGNOSIS:  Nuclear sclerotic cataract of the left eye.   POSTOPERATIVE DIAGNOSIS:  NUCLEAR SCLEROTIC CATARACT LEFT EYE   OPERATIVE PROCEDURE:  Procedure(s): CATARACT EXTRACTION PHACO AND INTRAOCULAR LENS PLACEMENT (IOC)   SURGEON:  Birder Robson, MD.   ANESTHESIA:   Anesthesiologist: Andria Frames, MD CRNA: Jenetta Downer, CRNA  1.      Managed anesthesia care. 2.      Topical tetracaine drops followed by 2% Xylocaine jelly applied in the preoperative holding area.   COMPLICATIONS:  None.   TECHNIQUE:   Stop and chop   DESCRIPTION OF PROCEDURE:  The patient was examined and consented in the preoperative holding area where the aforementioned topical anesthesia was applied to the left eye and then brought back to the Operating Room where the left eye was prepped and draped in the usual sterile ophthalmic fashion and a lid speculum was placed. A paracentesis was created with the side port blade and the anterior chamber was filled with viscoelastic. A near clear corneal incision was performed with the steel keratome. A continuous curvilinear capsulorrhexis was performed with a cystotome followed by the capsulorrhexis forceps. Hydrodissection and hydrodelineation were carried out with BSS on a blunt cannula. The lens was removed in a stop and chop  technique and the remaining cortical material was removed with the irrigation-aspiration handpiece. The capsular bag was inflated with viscoelastic and the Technis [id number] lens was placed in the capsular bag without complication. The remaining viscoelastic was removed from the eye with the irrigation-aspiration handpiece. The wounds were hydrated. The anterior chamber was flushed with Miostat and the eye was inflated to physiologic pressure. 0.1 mL of cefuroxime concentration 10 mg/mL was placed in the anterior chamber. The wounds were found to be water tight. The eye was dressed with Vigamox. The patient was given protective  glasses to wear throughout the day and a shield with which to sleep tonight. The patient was also given drops with which to begin a drop regimen today and will follow-up with me in one day.  Implant Name Type Inv. Item Serial No. Manufacturer Lot No. LRB No. Used  LENS IOL DIOP 20.0 - [id number] Intraocular Lens LENS IOL DIOP 20.0 [id number]   Left 1   Procedure(s) with comments: CATARACT EXTRACTION PHACO AND INTRAOCULAR LENS PLACEMENT (IOC) (Left) - [place] 33.9 AP% 15.5 CDE 5.28 Fluid Pack Lot # [id number]  Electronically signed: [place] 01/25/2016 12:19 PM

## 2016-01-25 NOTE — Anesthesia Postprocedure Evaluation (Signed)
Anesthesia Post Note  Patient: Sharon Mcclure  Procedure(s) Performed: Procedure(s) (LRB): CATARACT EXTRACTION PHACO AND INTRAOCULAR LENS PLACEMENT (IOC) (Left)  Patient location during evaluation: PACU Anesthesia Type: MAC Level of consciousness: awake and alert Pain management: pain level controlled Vital Signs Assessment: post-procedure vital signs reviewed and stable Respiratory status: spontaneous breathing, nonlabored ventilation, respiratory function stable and patient connected to nasal cannula oxygen Cardiovascular status: stable and blood pressure returned to baseline Anesthetic complications: no    Last Vitals:  Filed Vitals:   01/25/16 1222 01/25/16 1246  BP: 126/77 143/65  Pulse: 64 68  Temp: 36.1 C   Resp: 16 16    Last Pain: There were no vitals filed for this visit.               Precious Haws Jaquann Guarisco

## 2016-01-25 NOTE — Anesthesia Preprocedure Evaluation (Signed)
Anesthesia Evaluation  Patient identified by MRN, date of birth, ID band Patient awake    Reviewed: Allergy & Precautions, H&P , NPO status , Patient's Chart, lab work & pertinent test results  History of Anesthesia Complications Negative for: history of anesthetic complications  Airway Mallampati: III  TM Distance: <3 FB Neck ROM: limited    Dental  (+) Poor Dentition, Missing, Chipped, Upper Dentures, Lower Dentures   Pulmonary neg shortness of breath, pneumonia, resolved, former smoker,    Pulmonary exam normal breath sounds clear to auscultation       Cardiovascular Exercise Tolerance: Good hypertension, (-) angina(-) Past MI and (-) DOE Normal cardiovascular exam Rhythm:regular Rate:Normal     Neuro/Psych negative neurological ROS  negative psych ROS   GI/Hepatic Neg liver ROS, GERD  Controlled,  Endo/Other  negative endocrine ROS  Renal/GU negative Renal ROS  negative genitourinary   Musculoskeletal   Abdominal   Peds  Hematology negative hematology ROS (+)   Anesthesia Other Findings Past Medical History:   Insomnia                                                     Fatty liver                                                  Vitamin D deficiency                                         History of skin cancer                                         Comment:Right arm   Anxiety                                                      Hyperlipidemia                                               HTN (hypertension)                                           Rosacea                                         11/24/2013    GERD (gastroesophageal reflux disease)                       Pneumonia  October 2*  Past Surgical History:   TUBAL LIGATION                                   1981         APPENDECTOMY                                                 BMI    Body Mass Index   34.21 kg/m 2      Reproductive/Obstetrics negative OB ROS                             Anesthesia Physical Anesthesia Plan  ASA: III  Anesthesia Plan: MAC   Post-op Pain Management:    Induction:   Airway Management Planned:   Additional Equipment:   Intra-op Plan:   Post-operative Plan:   Informed Consent: I have reviewed the patients History and Physical, chart, labs and discussed the procedure including the risks, benefits and alternatives for the proposed anesthesia with the patient or authorized representative who has indicated his/her understanding and acceptance.   Dental Advisory Given  Plan Discussed with: Anesthesiologist, CRNA and Surgeon  Anesthesia Plan Comments:         Anesthesia Quick Evaluation

## 2016-02-16 DIAGNOSIS — H2511 Age-related nuclear cataract, right eye: Secondary | ICD-10-CM | POA: Diagnosis not present

## 2016-02-22 ENCOUNTER — Ambulatory Visit: Payer: Medicare Other | Admitting: Anesthesiology

## 2016-02-22 ENCOUNTER — Encounter: Payer: Self-pay | Admitting: *Deleted

## 2016-02-22 ENCOUNTER — Ambulatory Visit
Admission: RE | Admit: 2016-02-22 | Discharge: 2016-02-22 | Disposition: A | Discharge status: Home / Self Care | Payer: Medicare Other | Source: Ambulatory Visit | Attending: Ophthalmology | Admitting: Ophthalmology

## 2016-02-22 ENCOUNTER — Encounter
Admission: RE | Disposition: A | Discharge status: Home / Self Care | Payer: Self-pay | Source: Ambulatory Visit | Attending: Ophthalmology

## 2016-02-22 DIAGNOSIS — Z87891 Personal history of nicotine dependence: Secondary | ICD-10-CM | POA: Diagnosis not present

## 2016-02-22 DIAGNOSIS — K219 Gastro-esophageal reflux disease without esophagitis: Secondary | ICD-10-CM | POA: Insufficient documentation

## 2016-02-22 DIAGNOSIS — F419 Anxiety disorder, unspecified: Secondary | ICD-10-CM | POA: Diagnosis not present

## 2016-02-22 DIAGNOSIS — I10 Essential (primary) hypertension: Secondary | ICD-10-CM | POA: Diagnosis not present

## 2016-02-22 DIAGNOSIS — H2511 Age-related nuclear cataract, right eye: Secondary | ICD-10-CM | POA: Insufficient documentation

## 2016-02-22 HISTORY — PX: CATARACT EXTRACTION W/PHACO: SHX586

## 2016-02-22 SURGERY — PHACOEMULSIFICATION, CATARACT, WITH IOL INSERTION
Anesthesia: Monitor Anesthesia Care | Site: Eye | Laterality: Right | Wound class: Clean

## 2016-02-22 MED ORDER — SODIUM CHLORIDE 0.9 % IV SOLN
INTRAVENOUS | Status: DC
  Administered 2016-02-22 (×2): via INTRAVENOUS

## 2016-02-22 MED ORDER — EPINEPHRINE HCL 1 MG/ML IJ SOLN
INTRAOCULAR | Status: DC | PRN
  Administered 2016-02-22: 1 mL via OPHTHALMIC

## 2016-02-22 MED ORDER — CARBACHOL 0.01 % IO SOLN
INTRAOCULAR | Status: DC | PRN
Start: 2016-02-22 — End: 2016-02-22
  Administered 2016-02-22: .5 mL via INTRAOCULAR

## 2016-02-22 MED ORDER — ALFENTANIL 500 MCG/ML IJ INJ
INJECTION | INTRAMUSCULAR | Status: DC | PRN
  Administered 2016-02-22 (×2): 250 ug via INTRAVENOUS

## 2016-02-22 MED ORDER — ARMC OPHTHALMIC DILATING GEL
1.0000 "application " | OPHTHALMIC | Status: AC
  Administered 2016-02-22 (×2): 1 via OPHTHALMIC

## 2016-02-22 MED ORDER — TETRACAINE HCL 0.5 % OP SOLN
OPHTHALMIC | Status: AC
  Filled 2016-02-22: qty 2

## 2016-02-22 MED ORDER — EPINEPHRINE HCL 1 MG/ML IJ SOLN
INTRAMUSCULAR | Status: AC
  Filled 2016-02-22: qty 1

## 2016-02-22 MED ORDER — MIDAZOLAM HCL 2 MG/2ML IJ SOLN
INTRAMUSCULAR | Status: DC | PRN
  Administered 2016-02-22: 0.5 mg via INTRAVENOUS

## 2016-02-22 MED ORDER — MOXIFLOXACIN HCL 0.5 % OP SOLN: 1.0000 [drp] | Freq: Once | OPHTHALMIC | Status: DC

## 2016-02-22 MED ORDER — POVIDONE-IODINE 5 % OP SOLN
1.0000 "application " | Freq: Once | OPHTHALMIC | Status: AC
  Administered 2016-02-22: 1 via OPHTHALMIC

## 2016-02-22 MED ORDER — TETRACAINE HCL 0.5 % OP SOLN
1.0000 [drp] | Freq: Once | OPHTHALMIC | Status: AC
  Administered 2016-02-22: 1 [drp] via OPHTHALMIC

## 2016-02-22 MED ORDER — POVIDONE-IODINE 5 % OP SOLN
OPHTHALMIC | Status: DC | PRN
  Administered 2016-02-22: 1 via OPHTHALMIC

## 2016-02-22 MED ORDER — MOXIFLOXACIN HCL 0.5 % OP SOLN
OPHTHALMIC | Status: AC
  Filled 2016-02-22: qty 3

## 2016-02-22 MED ORDER — MOXIFLOXACIN HCL 0.5 % OP SOLN
OPHTHALMIC | Status: DC | PRN
  Administered 2016-02-22: 1 [drp] via OPHTHALMIC

## 2016-02-22 MED ORDER — CEFUROXIME OPHTHALMIC INJECTION 1 MG/0.1 ML
INJECTION | OPHTHALMIC | Status: AC
  Filled 2016-02-22: qty 0.1

## 2016-02-22 MED ORDER — ARMC OPHTHALMIC DILATING GEL
OPHTHALMIC | Status: AC
  Filled 2016-02-22: qty 0.25

## 2016-02-22 MED ORDER — POVIDONE-IODINE 5 % OP SOLN
OPHTHALMIC | Status: AC
  Filled 2016-02-22: qty 30

## 2016-02-22 MED ORDER — NA CHONDROIT SULF-NA HYALURON 40-17 MG/ML IO SOLN
INTRAOCULAR | Status: AC
  Filled 2016-02-22: qty 1

## 2016-02-22 MED ORDER — NA CHONDROIT SULF-NA HYALURON 40-17 MG/ML IO SOLN
INTRAOCULAR | Status: DC | PRN
  Administered 2016-02-22: 1 mL via INTRAOCULAR

## 2016-02-22 SURGICAL SUPPLY — 22 items
CANNULA ANT/CHMB 27G (MISCELLANEOUS) ×1 IMPLANT
CANNULA ANT/CHMB 27GA (MISCELLANEOUS) ×2 IMPLANT
CUP MEDICINE 2OZ PLAST GRAD ST (MISCELLANEOUS) ×2 IMPLANT
GLOVE BIO SURGEON STRL SZ8 (GLOVE) ×2 IMPLANT
GLOVE BIOGEL M 6.5 STRL (GLOVE) ×2 IMPLANT
GLOVE SURG LX 8.0 MICRO (GLOVE) ×1
GLOVE SURG LX STRL 8.0 MICRO (GLOVE) ×1 IMPLANT
GOWN STRL REUS W/ TWL LRG LVL3 (GOWN DISPOSABLE) ×2 IMPLANT
GOWN STRL REUS W/TWL LRG LVL3 (GOWN DISPOSABLE) ×4
LENS IOL TECNIS ITEC 20.5 (Intraocular Lens) ×1 IMPLANT
PACK CATARACT (MISCELLANEOUS) ×2 IMPLANT
PACK CATARACT BRASINGTON LX (MISCELLANEOUS) ×2 IMPLANT
PACK EYE AFTER SURG (MISCELLANEOUS) ×2 IMPLANT
SOL BSS BAG (MISCELLANEOUS) ×2
SOL PREP PVP 2OZ (MISCELLANEOUS) ×2
SOLUTION BSS BAG (MISCELLANEOUS) ×1 IMPLANT
SOLUTION PREP PVP 2OZ (MISCELLANEOUS) ×1 IMPLANT
SYR 3ML LL SCALE MARK (SYRINGE) ×2 IMPLANT
SYR 5ML LL (SYRINGE) ×2 IMPLANT
SYR TB 1ML 27GX1/2 LL (SYRINGE) ×2 IMPLANT
WATER STERILE IRR 1000ML POUR (IV SOLUTION) ×2 IMPLANT
WIPE NON LINTING 3.25X3.25 (MISCELLANEOUS) ×2 IMPLANT

## 2016-02-22 NOTE — Discharge Instructions (Signed)
Eye Surgery Discharge Instructions  Expect mild scratchy sensation or mild soreness. DO NOT RUB YOUR EYE!  The day of surgery:  Minimal physical activity, but bed rest is not required  No reading, computer work, or close hand work  No bending, lifting, or straining.  May watch TV  For 24 hours:  No driving, legal decisions, or alcoholic beverages  Safety precautions  Eat anything you prefer: It is better to start with liquids, then soup then solid foods.  _____ Eye patch should be worn until postoperative exam tomorrow.  ____ Solar shield eyeglasses should be worn for comfort in the sunlight/patch while sleeping  Resume all regular medications including aspirin or Coumadin if these were discontinued prior to surgery. You may shower, bathe, shave, or wash your hair. Tylenol may be taken for mild discomfort.  Call your doctor if you experience significant pain, nausea, or vomiting, fever > 101 or other signs of infection. [phone] or [phone] Specific instructions:  Follow-up Information    Follow up with Tim Lair, MD On 02/23/2016.   Specialty:  Ophthalmology   Why:  10:25   Contact information:   [address] [place] [address] [phone]

## 2016-02-22 NOTE — Transfer of Care (Signed)
Immediate Anesthesia Transfer of Care Note  Patient: Sharon Mcclure  Procedure(s) Performed: Procedure(s) with comments: CATARACT EXTRACTION PHACO AND INTRAOCULAR LENS PLACEMENT (IOC) (Right) - [place] 00:40 AP% 23.6 CDE 9.45 fluid pack lot # [id number]  Patient Location: PACU and Short Stay  Anesthesia Type:MAC  Level of Consciousness: awake, oriented and patient cooperative  Airway & Oxygen Therapy: Patient Spontanous Breathing and Patient connected to nasal cannula oxygen  Post-op Assessment: Report given to RN  Post vital signs: stable  Last Vitals:  Filed Vitals:   02/22/16 0610  BP: 156/73  Pulse: 65  Temp: 36.6 C  Resp: 14    Last Pain: There were no vitals filed for this visit.       Complications: No apparent anesthesia complications

## 2016-02-22 NOTE — Anesthesia Postprocedure Evaluation (Signed)
Anesthesia Post Note  Patient: Sharon Mcclure  Procedure(s) Performed: Procedure(s) (LRB): CATARACT EXTRACTION PHACO AND INTRAOCULAR LENS PLACEMENT (IOC) (Right)  Patient location during evaluation: Other Anesthesia Type: General Level of consciousness: awake and alert Pain management: pain level controlled Vital Signs Assessment: post-procedure vital signs reviewed and stable Respiratory status: spontaneous breathing, nonlabored ventilation, respiratory function stable and patient connected to nasal cannula oxygen Cardiovascular status: blood pressure returned to baseline and stable Postop Assessment: no signs of nausea or vomiting Anesthetic complications: no    Last Vitals:  Filed Vitals:   02/22/16 0755 02/22/16 0802  BP: 140/56 130/62  Pulse: 64 62  Temp: 36.7 C   Resp: 16 16    Last Pain: There were no vitals filed for this visit.               Jinelle Butchko S

## 2016-02-22 NOTE — H&P (Signed)
  All labs reviewed. Abnormal studies sent to patients PCP when indicated.  Previous H&P reviewed, patient examined, there are NO CHANGES.  Sharon Mcclure LOUIS6/27/20177:08 AM

## 2016-02-22 NOTE — Anesthesia Preprocedure Evaluation (Signed)
Anesthesia Evaluation  Patient identified by MRN, date of birth, ID band Patient awake    Reviewed: Allergy & Precautions, NPO status , Patient's Chart, lab work & pertinent test results, reviewed documented beta blocker date and time   Airway Mallampati: III  TM Distance: >3 FB     Dental  (+) Chipped   Pulmonary pneumonia, resolved, former smoker,           Cardiovascular hypertension, Pt. on medications      Neuro/Psych Anxiety    GI/Hepatic GERD  Controlled,  Endo/Other    Renal/GU      Musculoskeletal   Abdominal   Peds  Hematology   Anesthesia Other Findings   Reproductive/Obstetrics                             Anesthesia Physical Anesthesia Plan  ASA: III  Anesthesia Plan: MAC   Post-op Pain Management:    Induction:   Airway Management Planned:   Additional Equipment:   Intra-op Plan:   Post-operative Plan:   Informed Consent: I have reviewed the patients History and Physical, chart, labs and discussed the procedure including the risks, benefits and alternatives for the proposed anesthesia with the patient or authorized representative who has indicated his/her understanding and acceptance.     Plan Discussed with: CRNA  Anesthesia Plan Comments:         Anesthesia Quick Evaluation

## 2016-02-22 NOTE — Op Note (Signed)
PREOPERATIVE DIAGNOSIS:  Nuclear sclerotic cataract of the right eye.   POSTOPERATIVE DIAGNOSIS:  nuclear sclerotic cataract right eye   OPERATIVE PROCEDURE: Procedure(s): CATARACT EXTRACTION PHACO AND INTRAOCULAR LENS PLACEMENT (IOC)   SURGEON:  Birder Robson, MD.   ANESTHESIA:  Anesthesiologist: Gunnar Bulla, MD CRNA: Courtney Paris, CRNA  1.      Managed anesthesia care. 2.      Topical tetracaine drops followed by 2% Xylocaine jelly applied in the preoperative holding area.   COMPLICATIONS:  None.   TECHNIQUE:   Stop and chop   DESCRIPTION OF PROCEDURE:  The patient was examined and consented in the preoperative holding area where the aforementioned topical anesthesia was applied to the right eye and then brought back to the Operating Room where the right eye was prepped and draped in the usual sterile ophthalmic fashion and a lid speculum was placed. A paracentesis was created with the side port blade and the anterior chamber was filled with viscoelastic. A near clear corneal incision was performed with the steel keratome. A continuous curvilinear capsulorrhexis was performed with a cystotome followed by the capsulorrhexis forceps. Hydrodissection and hydrodelineation were carried out with BSS on a blunt cannula. The lens was removed in a stop and chop  technique and the remaining cortical material was removed with the irrigation-aspiration handpiece. The capsular bag was inflated with viscoelastic and the Technis [id number]  lens was placed in the capsular bag without complication. The remaining viscoelastic was removed from the eye with the irrigation-aspiration handpiece. The wounds were hydrated. The anterior chamber was flushed with Miostat and the eye was inflated to physiologic pressure. 0.2 mL of Vigamox diluted three/one with BSS was placed in the anterior chamber. The wounds were found to be water tight. The eye was dressed with Vigamox. The patient was given protective glasses to  wear throughout the day and a shield with which to sleep tonight. The patient was also given drops with which to begin a drop regimen today and will follow-up with me in one day.  Implant Name Type Inv. Item Serial No. Manufacturer Lot No. LRB No. Used  LENS IOL DIOP 20.5 - [id number] Intraocular Lens LENS IOL DIOP 20.5 [id number]   Right 1   Procedure(s) with comments: CATARACT EXTRACTION PHACO AND INTRAOCULAR LENS PLACEMENT (IOC) (Right) - [place] 00:40 AP% 23.6 CDE 9.45 fluid pack lot # [id number]  Electronically signed: [place] 02/22/2016 7:44 AM

## 2016-02-28 DIAGNOSIS — Z1231 Encounter for screening mammogram for malignant neoplasm of breast: Secondary | ICD-10-CM | POA: Diagnosis not present

## 2016-03-24 ENCOUNTER — Encounter: Payer: Self-pay | Admitting: Family Medicine

## 2016-03-29 DIAGNOSIS — Z961 Presence of intraocular lens: Secondary | ICD-10-CM | POA: Diagnosis not present

## 2016-06-14 ENCOUNTER — Other Ambulatory Visit: Payer: Self-pay | Admitting: Family Medicine

## 2016-10-09 DIAGNOSIS — Z961 Presence of intraocular lens: Secondary | ICD-10-CM | POA: Diagnosis not present

## 2016-11-08 ENCOUNTER — Other Ambulatory Visit: Payer: Self-pay | Admitting: Family Medicine

## 2016-12-18 ENCOUNTER — Encounter: Payer: Self-pay | Admitting: Family Medicine

## 2016-12-18 ENCOUNTER — Ambulatory Visit (INDEPENDENT_AMBULATORY_CARE_PROVIDER_SITE_OTHER): Payer: Medicare Other | Admitting: Family Medicine

## 2016-12-18 VITALS — BP 136/70 | HR 76 | Temp 98.2°F | Resp 16 | Ht 61.0 in | Wt 184.0 lb

## 2016-12-18 DIAGNOSIS — M720 Palmar fascial fibromatosis [Dupuytren]: Secondary | ICD-10-CM | POA: Diagnosis not present

## 2016-12-18 MED ORDER — DICLOFENAC SODIUM 1 % TD GEL: 2.0000 g | Freq: Four times a day (QID) | TRANSDERMAL | 1 refills | Status: DC

## 2016-12-18 NOTE — Progress Notes (Signed)
Subjective:    Patient ID: Sharon Mcclure, female    DOB: Jan 09, 1941, 76 y.o.   MRN: [id number]  HPI Patient presents with pain in her right hand. This is been gradually increasing over the last several months. 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  She's been doing this for more than 50 years. She is right-hand dominant. She is now developing painful contractures in the tendons in the palm of her hand in the third and fourth digits. She is also developing a trigger finger in the fourth digit. There is a palpable tight chordin the palm of her hand in the flexor tendon.  Past Medical History:  Diagnosis Date  . Anxiety   . Fatty liver   . GERD (gastroesophageal reflux disease)   . History of skin cancer    Right arm  . HTN (hypertension)   . Hyperlipidemia   . Insomnia   . Pneumonia October 2016  . Rosacea 11/24/2013  . Vitamin D deficiency    Past Surgical History:  Procedure Laterality Date  . APPENDECTOMY    . CATARACT EXTRACTION W/PHACO Left 01/25/2016   Procedure: CATARACT EXTRACTION PHACO AND INTRAOCULAR LENS PLACEMENT (IOC);  Surgeon: Birder Robson, MD;  Location: [facility];  Service: Ophthalmology;  Laterality: Left;  [place] 33.9 AP% 15.5 CDE 5.28 Fluid Pack Lot # [id number]  . CATARACT EXTRACTION W/PHACO Right 02/22/2016   Procedure: CATARACT EXTRACTION PHACO AND INTRAOCULAR LENS PLACEMENT (IOC);  Surgeon: Birder Robson, MD;  Location: [facility];  Service: Ophthalmology;  Laterality: Right;  [place] 00:40 AP% 23.6 CDE 9.45 fluid pack lot # [id number]  . TUBAL LIGATION  1981   Current Outpatient Prescriptions on File Prior to Visit  Medication Sig Dispense Refill  . amLODipine (NORVASC) 5 MG tablet TAKE ONE TABLET BY MOUTH ONCE DAILY 30 tablet 11  . aspirin 81 MG tablet Take 81 mg by mouth daily.      . benzoyl peroxide-erythromycin (BENZAMYCIN) gel APPLY TOPICALLY TO AFFECTED AREA TWICE DAILY 24 g 2  . citalopram (CELEXA) 40 MG tablet TAKE ONE  TABLET BY MOUTH ONCE DAILY 30 tablet 11  . Multiple Vitamin (MULTIVITAMIN) tablet Take 1 tablet by mouth daily.    . pantoprazole (PROTONIX) 40 MG tablet TAKE ONE TABLET BY MOUTH TWICE DAILY 60 tablet 11  . VITAMIN D, ERGOCALCIFEROL, PO Take 5,000 Units by mouth daily.     No current facility-administered medications on file prior to visit.    Allergies  Allergen Reactions  . Ampicillin Hives, Shortness Of Breath and Swelling    Has patient had a PCN reaction causing immediate rash, facial/tongue/throat swelling, SOB or lightheadedness with hypotension: Yes Has patient had a PCN reaction causing severe rash involving mucus membranes or skin necrosis: No Has patient had a PCN reaction that required hospitalization No Has patient had a PCN reaction occurring within the last 10 years: No If all of the above answers are "NO", then may proceed with Cephalosporin use.   Marland Kitchen Penicillins Hives and Shortness Of Breath    Has patient had a PCN reaction causing immediate rash, facial/tongue/throat swelling, SOB or lightheadedness with hypotension: Yes Has patient had a PCN reaction causing severe rash involving mucus membranes or skin necrosis: No Has patient had a PCN reaction that required hospitalization No Has patient had a PCN reaction occurring within the last 10 years: No If all of the above answers are "NO", then may proceed with Cephalosporin use.   Marland Kitchen  Crestor [Rosuvastatin Calcium]     Burning sensation  . Ezetimibe-Simvastatin   . Niaspan [Niacin Er]     Fatigues, aches.  . Vytorin [Ezetimibe-Simvastatin]   . Zetia [Ezetimibe]   . Zocor [Simvastatin]   . Welchol [Colesevelam Hcl] Other (See Comments)    Severe bloating even on low dose   Social History   Social History  . Marital status: Single    Spouse name: N/A  . Number of children: N/A  . Years of education: N/A   Occupational History  . Not on file.   Social History Main Topics  . Smoking status: Former Smoker     Types: Cigarettes  . Smokeless tobacco: Never Used  . Alcohol use No  . Drug use: No  . Sexual activity: Not on file   Other Topics Concern  . Not on file   Social History Narrative  . No narrative on file      Review of Systems  All other systems reviewed and are negative.      Objective:   Physical Exam  Cardiovascular: Normal rate, regular rhythm and normal heart sounds.   Pulmonary/Chest: Effort normal and breath sounds normal.  Musculoskeletal:       Right hand: She exhibits decreased range of motion, tenderness and deformity. She exhibits no bony tenderness. Normal sensation noted. Decreased strength noted.       Hands: Vitals reviewed.    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 She is also developing trigger finger in her right fourth digit. Given her line of work, I recommended a referral to a Copy for possible release of the scar tissue versus cortisone injection. Meanwhile treat the swelling and pain with (gel 2 g 4 times a day when necessary

## 2016-12-20 ENCOUNTER — Telehealth: Payer: Self-pay | Admitting: Family Medicine

## 2016-12-20 DIAGNOSIS — M720 Palmar fascial fibromatosis [Dupuytren]: Secondary | ICD-10-CM

## 2016-12-20 NOTE — Telephone Encounter (Signed)
Submitted PA through [email or web address] and received the following:   [employer] is reviewing your PA request. Typically an electronic response will be received within 72 hours. To check for an update later, open this request from your dashboard.

## 2016-12-22 NOTE — Telephone Encounter (Signed)
PA was denied - will submit appeal.

## 2016-12-26 ENCOUNTER — Encounter: Payer: Self-pay | Admitting: *Deleted

## 2016-12-26 NOTE — Telephone Encounter (Signed)
Appeal faxed

## 2016-12-27 DIAGNOSIS — M720 Palmar fascial fibromatosis [Dupuytren]: Secondary | ICD-10-CM | POA: Diagnosis not present

## 2016-12-27 DIAGNOSIS — M19041 Primary osteoarthritis, right hand: Secondary | ICD-10-CM | POA: Diagnosis not present

## 2016-12-27 DIAGNOSIS — M65331 Trigger finger, right middle finger: Secondary | ICD-10-CM | POA: Insufficient documentation

## 2017-01-05 NOTE — Telephone Encounter (Signed)
Received request for more information.   Faxed information to insurance.

## 2017-01-08 MED ORDER — DICLOFENAC SODIUM 1 % TD GEL: 2.0000 g | Freq: Four times a day (QID) | TRANSDERMAL | 1 refills | Status: DC

## 2017-01-08 NOTE — Telephone Encounter (Signed)
Approved through 08/27/2017 - pharm made aware.

## 2017-01-10 ENCOUNTER — Other Ambulatory Visit: Payer: Self-pay | Admitting: Family Medicine

## 2017-01-29 DIAGNOSIS — M19041 Primary osteoarthritis, right hand: Secondary | ICD-10-CM

## 2017-01-29 DIAGNOSIS — M65331 Trigger finger, right middle finger: Secondary | ICD-10-CM | POA: Diagnosis not present

## 2017-01-29 HISTORY — DX: Primary osteoarthritis, right hand: M19.041

## 2017-04-30 IMAGING — CR DG CHEST 2V
2 series · 2 of 2 positions shown · non-contrast
Comparison: 06/27/2015.

CLINICAL DATA: Pneumonia.  Follow-up exam.

EXAM:
CHEST  2 VIEW

[w chest pa]
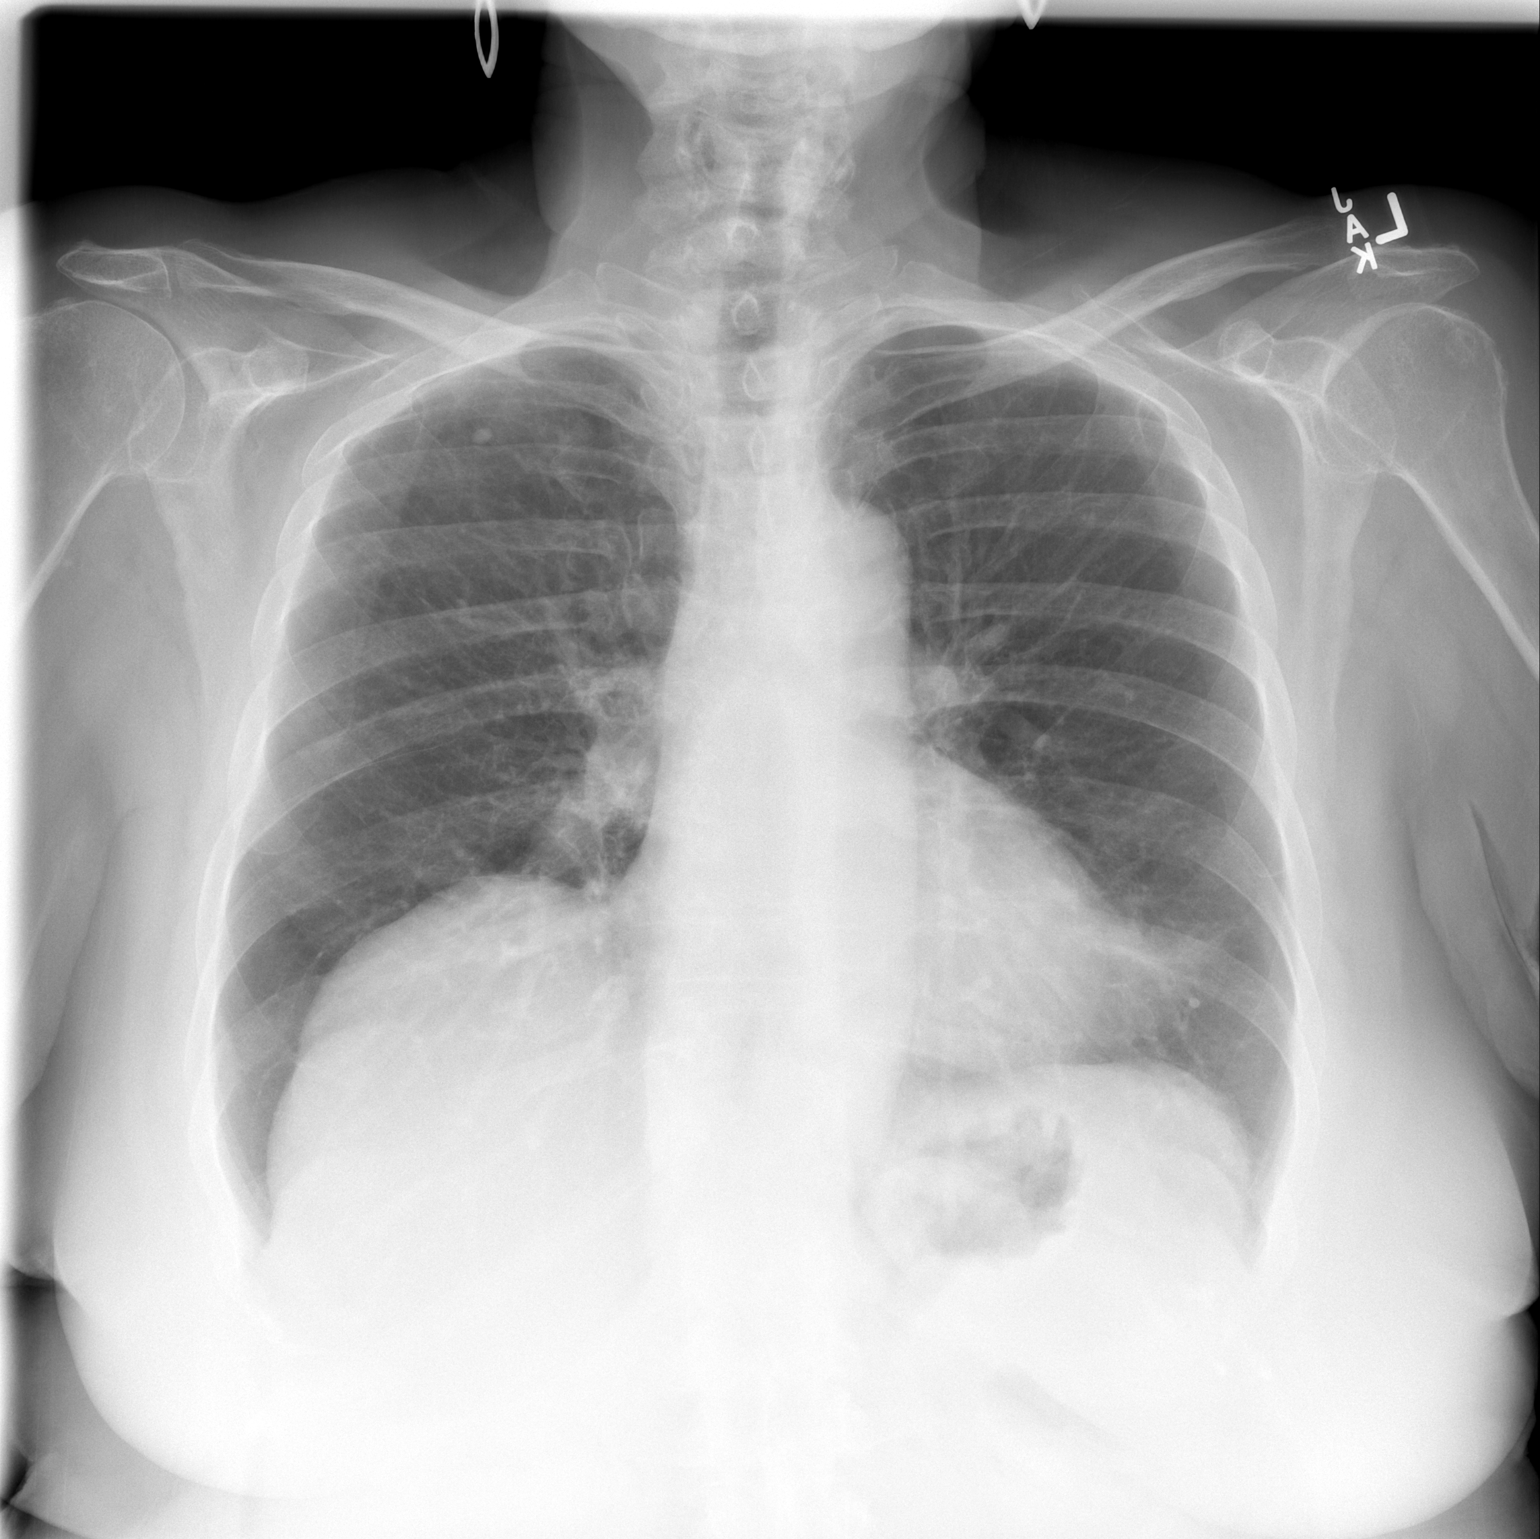

[w chest lat]
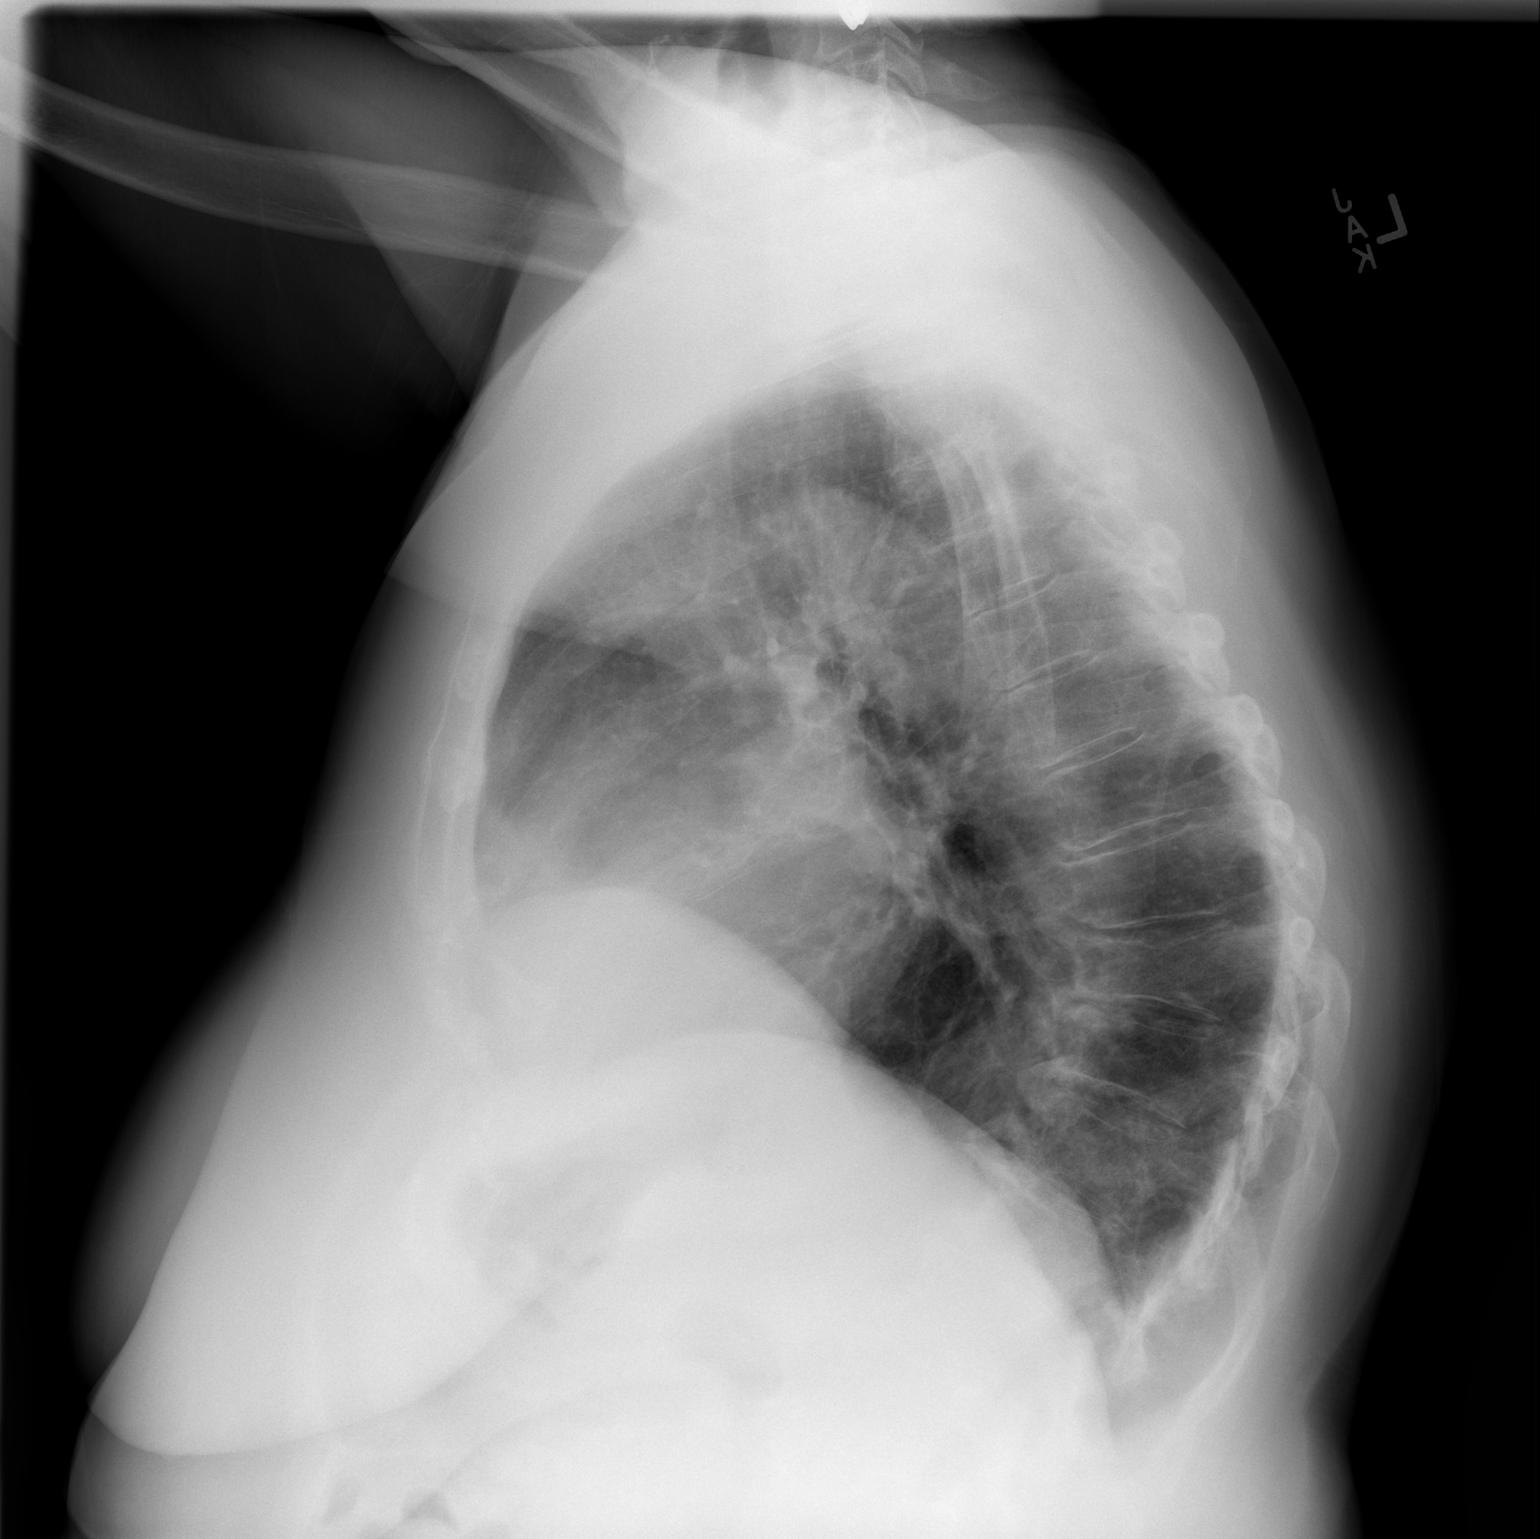

[2 of 2 positions shown; findings below may reference images not displayed]

FINDINGS: Mediastinum and hilar structures are normal. Heart size normal. No
pulmonary venous congestion. A persistent ill-defined density in the
left lung base without significant interim change. To exclude an
underlying mass lesion contrast-enhanced chest CT is suggested.
Stable calcified pulmonary nodule right upper lobe consistent with
granuloma. No pleural effusion or pneumothorax. Stable mid thoracic
vertebral body compression fracture. Diffuse degenerative change
thoracic spine .
IMPRESSION: Persistent ill-defined density in the left lung base without
significant interim clearing from prior chest x-ray of 06/27/2015.
AAA pulmonary mass lesion cannot be excluded. Contrast enhanced CT
of the chest is suggested for further evaluation .

## 2017-06-04 ENCOUNTER — Encounter: Payer: Self-pay | Admitting: Family Medicine

## 2017-06-04 ENCOUNTER — Ambulatory Visit (INDEPENDENT_AMBULATORY_CARE_PROVIDER_SITE_OTHER): Payer: Medicare Other | Admitting: Family Medicine

## 2017-06-04 VITALS — BP 124/80 | HR 74 | Temp 98.4°F | Resp 16 | Ht 61.0 in | Wt 178.0 lb

## 2017-06-04 DIAGNOSIS — B9689 Other specified bacterial agents as the cause of diseases classified elsewhere: Secondary | ICD-10-CM | POA: Diagnosis not present

## 2017-06-04 DIAGNOSIS — J019 Acute sinusitis, unspecified: Secondary | ICD-10-CM | POA: Diagnosis not present

## 2017-06-04 MED ORDER — LEVOFLOXACIN 500 MG PO TABS: 500.0000 mg | ORAL_TABLET | Freq: Every day | ORAL | 0 refills | Status: DC

## 2017-06-04 MED ORDER — PREDNISONE 20 MG PO TABS: ORAL_TABLET | ORAL | 0 refills | Status: DC

## 2017-06-04 NOTE — Progress Notes (Signed)
Subjective:    Patient ID: Sharon Mcclure, female    DOB: 1941-01-04, 76 y.o.   MRN: [id number]  HPI Patient is concerned because last time she had a sinus infection, she let it go too long and then subsequently developed community-acquired pneumonia. Symptoms began 10-14 days ago with head congestion, rhinorrhea, postnasal drip. This is progressed and the patient now has pressure that is constant in both cheekbones. Her teeth hurt. She also has a constant sinus headache behind her eyes and in her forehead. She has constant postnasal drip, this is leading to chest congestion with wheezing, coughing, and some mild shortness of breath. She denies any fever. Cough is nonproductive Past Medical History:  Diagnosis Date  . Anxiety   . Fatty liver   . GERD (gastroesophageal reflux disease)   . History of skin cancer    Right arm  . HTN (hypertension)   . Hyperlipidemia   . Insomnia   . Pneumonia October 2016  . Rosacea 11/24/2013  . Vitamin D deficiency    Past Surgical History:  Procedure Laterality Date  . APPENDECTOMY    . CATARACT EXTRACTION W/PHACO Left 01/25/2016   Procedure: CATARACT EXTRACTION PHACO AND INTRAOCULAR LENS PLACEMENT (IOC);  Surgeon: Birder Robson, MD;  Location: [facility];  Service: Ophthalmology;  Laterality: Left;  [place] 33.9 AP% 15.5 CDE 5.28 Fluid Pack Lot # [id number]  . CATARACT EXTRACTION W/PHACO Right 02/22/2016   Procedure: CATARACT EXTRACTION PHACO AND INTRAOCULAR LENS PLACEMENT (IOC);  Surgeon: Birder Robson, MD;  Location: [facility];  Service: Ophthalmology;  Laterality: Right;  [place] 00:40 AP% 23.6 CDE 9.45 fluid pack lot # [id number]  . TUBAL LIGATION  1981   Current Outpatient Prescriptions on File Prior to Visit  Medication Sig Dispense Refill  . amLODipine (NORVASC) 5 MG tablet TAKE ONE TABLET BY MOUTH ONCE DAILY 30 tablet 11  . aspirin 81 MG tablet Take 81 mg by mouth daily.      . benzoyl peroxide-erythromycin (BENZAMYCIN) gel APPLY TOPICALLY TO  AFFECTED AREA TWICE DAILY 24 g 2  . citalopram (CELEXA) 40 MG tablet TAKE 1 TABLET BY MOUTH ONCE A DAY 90 tablet 3  . diclofenac sodium (VOLTAREN) 1 % GEL Apply 2 g topically 4 (four) times daily. 100 g 1  . Multiple Vitamin (MULTIVITAMIN) tablet Take 1 tablet by mouth daily.    . pantoprazole (PROTONIX) 40 MG tablet TAKE 1 TABLET BY MOUTH TWICE A DAY 60 tablet 3  . VITAMIN D, ERGOCALCIFEROL, PO Take 5,000 Units by mouth daily.     No current facility-administered medications on file prior to visit.    Allergies  Allergen Reactions  . Ampicillin Hives, Shortness Of Breath and Swelling    Has patient had a PCN reaction causing immediate rash, facial/tongue/throat swelling, SOB or lightheadedness with hypotension: Yes Has patient had a PCN reaction causing severe rash involving mucus membranes or skin necrosis: No Has patient had a PCN reaction that required hospitalization No Has patient had a PCN reaction occurring within the last 10 years: No If all of the above answers are "NO", then may proceed with Cephalosporin use.   Marland Kitchen Penicillins Hives and Shortness Of Breath    Has patient had a PCN reaction causing immediate rash, facial/tongue/throat swelling, SOB or lightheadedness with hypotension: Yes Has patient had a PCN reaction causing severe rash involving mucus membranes or skin necrosis: No Has patient had a PCN reaction that required hospitalization No Has patient had a PCN reaction occurring within the  last 10 years: No If all of the above answers are "NO", then may proceed with Cephalosporin use.   Marland Kitchen Crestor [Rosuvastatin Calcium]     Burning sensation  . Ezetimibe-Simvastatin   . Niaspan [Niacin Er]     Fatigues, aches.  . Vytorin [Ezetimibe-Simvastatin]   . Zetia [Ezetimibe]   . Zocor [Simvastatin]   . Welchol [Colesevelam Hcl] Other (See Comments)    Severe bloating even on low dose   Social History   Social History  . Marital status: Single    Spouse name: N/A  .  Number of children: N/A  . Years of education: N/A   Occupational History  . Not on file.   Social History Main Topics  . Smoking status: Former Smoker    Types: Cigarettes  . Smokeless tobacco: Never Used  . Alcohol use No  . Drug use: No  . Sexual activity: Not on file   Other Topics Concern  . Not on file   Social History Narrative  . No narrative on file      Review of Systems  All other systems reviewed and are negative.      Objective:   Physical Exam  Constitutional: She appears well-developed and well-nourished.  HENT:  Right Ear: External ear normal.  Left Ear: External ear normal.  Nose: Mucosal edema and rhinorrhea present. Right sinus exhibits maxillary sinus tenderness and frontal sinus tenderness. Left sinus exhibits maxillary sinus tenderness and frontal sinus tenderness.  Mouth/Throat: Oropharynx is clear and moist. No oropharyngeal exudate.  Cardiovascular: Normal rate, regular rhythm and normal heart sounds.   No murmur heard. Pulmonary/Chest: Effort normal. She has no wheezes. She has rhonchi. She has no rales.  Lymphadenopathy:    She has no cervical adenopathy.  Vitals reviewed.         Assessment & Plan:  Acute bacterial rhinosinusitis - Plan: predniSONE (DELTASONE) 20 MG tablet, levofloxacin (LEVAQUIN) 500 MG tablet  I believe the patient has bacterial rhinosinusitis with postnasal drip causing her cough and rhonchorous breath sounds. Begin Levaquin 500 mg by mouth daily for 7 days in addition to a prednisone taper pack and reassess in one week if no better or sooner if worse

## 2017-06-06 ENCOUNTER — Telehealth: Payer: Self-pay | Admitting: Family Medicine

## 2017-06-06 NOTE — Telephone Encounter (Signed)
[name] was calling in regards to her brother Sharon Mcclure not her mother - note put in his chart.

## 2017-06-06 NOTE — Telephone Encounter (Signed)
Shelle Iron calling to speak to you only regarding mrs Rosales, [phone] SHE SAID IT WAS PERSONAL

## 2017-07-02 ENCOUNTER — Other Ambulatory Visit: Payer: Self-pay | Admitting: Family Medicine

## 2017-07-06 ENCOUNTER — Encounter: Payer: Self-pay | Admitting: Family Medicine

## 2017-07-06 LAB — IFOBT (OCCULT BLOOD): IFOBT: NEGATIVE

## 2017-10-26 ENCOUNTER — Other Ambulatory Visit: Payer: Self-pay | Admitting: Family Medicine

## 2017-11-21 ENCOUNTER — Other Ambulatory Visit: Payer: Self-pay | Admitting: Family Medicine

## 2018-02-11 ENCOUNTER — Other Ambulatory Visit: Payer: Self-pay | Admitting: Family Medicine

## 2018-04-12 ENCOUNTER — Encounter: Payer: Self-pay | Admitting: Family Medicine

## 2018-04-12 ENCOUNTER — Ambulatory Visit (INDEPENDENT_AMBULATORY_CARE_PROVIDER_SITE_OTHER): Payer: Medicare Other | Admitting: Family Medicine

## 2018-04-12 VITALS — BP 128/70 | HR 68 | Temp 97.9°F | Resp 18 | Ht 61.0 in | Wt 184.0 lb

## 2018-04-12 DIAGNOSIS — J019 Acute sinusitis, unspecified: Secondary | ICD-10-CM

## 2018-04-12 DIAGNOSIS — B9689 Other specified bacterial agents as the cause of diseases classified elsewhere: Secondary | ICD-10-CM

## 2018-04-12 MED ORDER — LEVOFLOXACIN 500 MG PO TABS: 500.0000 mg | ORAL_TABLET | Freq: Every day | ORAL | 0 refills | Status: DC

## 2018-04-12 NOTE — Progress Notes (Signed)
Subjective:    Patient ID: Sharon Mcclure, female    DOB: [birth year], 77 y.o.   MRN: [id number]  HPI Patient has been battling her sinuses now for more than 2 weeks.  Symptoms include pain and pressure in both frontal sinuses.  She also has pain in her right maxillary sinus that radiates into her cheek and down into her jaw.  She has pain and pressure behind her right ear.  She has decreased hearing in her right ear as well as eustachian tube dysfunction-like symptoms of pressure and popping in the ear.  She also has mild dizziness.  She is been taking Mucinex and Flonase with no relief. Past Medical History:  Diagnosis Date  . Anxiety   . Fatty liver   . GERD (gastroesophageal reflux disease)   . History of skin cancer    Right arm  . HTN (hypertension)   . Hyperlipidemia   . Insomnia   . Pneumonia October 2016  . Rosacea 11/24/2013  . Vitamin D deficiency    Past Surgical History:  Procedure Laterality Date  . APPENDECTOMY    . CATARACT EXTRACTION W/PHACO Left 01/25/2016   Procedure: CATARACT EXTRACTION PHACO AND INTRAOCULAR LENS PLACEMENT (IOC);  Surgeon: Birder Robson, MD;  Location: [facility];  Service: Ophthalmology;  Laterality: Left;  [place] 33.9 AP% 15.5 CDE 5.28 Fluid Pack Lot # [id number]  . CATARACT EXTRACTION W/PHACO Right 02/22/2016   Procedure: CATARACT EXTRACTION PHACO AND INTRAOCULAR LENS PLACEMENT (IOC);  Surgeon: Birder Robson, MD;  Location: [facility];  Service: Ophthalmology;  Laterality: Right;  [place] 00:40 AP% 23.6 CDE 9.45 fluid pack lot # [id number]  . TUBAL LIGATION  1981   Current Outpatient Medications on File Prior to Visit  Medication Sig Dispense Refill  . amLODipine (NORVASC) 5 MG tablet TAKE 1 TABLET BY MOUTH ONCE DAILY 90 tablet 0  . aspirin 81 MG tablet Take 81 mg by mouth daily.      . benzoyl peroxide-erythromycin (BENZAMYCIN) gel APPLY TOPICALLY TO AFFECTED AREA TWICE DAILY 24 g 2  . citalopram (CELEXA) 40 MG tablet TAKE 1 TABLET BY MOUTH ONCE  DAILY 90 tablet 3  . Multiple Vitamin (MULTIVITAMIN) tablet Take 1 tablet by mouth daily.    . pantoprazole (PROTONIX) 40 MG tablet TAKE 1 TABLET BY MOUTH TWICE (2) DAILY 180 tablet 0  . VITAMIN D, ERGOCALCIFEROL, PO Take 5,000 Units by mouth daily.     No current facility-administered medications on file prior to visit.    Allergies  Allergen Reactions  . Ampicillin Hives, Shortness Of Breath and Swelling    Has patient had a PCN reaction causing immediate rash, facial/tongue/throat swelling, SOB or lightheadedness with hypotension: Yes Has patient had a PCN reaction causing severe rash involving mucus membranes or skin necrosis: No Has patient had a PCN reaction that required hospitalization No Has patient had a PCN reaction occurring within the last 10 years: No If all of the above answers are "NO", then may proceed with Cephalosporin use.   Marland Kitchen Penicillins Hives and Shortness Of Breath    Has patient had a PCN reaction causing immediate rash, facial/tongue/throat swelling, SOB or lightheadedness with hypotension: Yes Has patient had a PCN reaction causing severe rash involving mucus membranes or skin necrosis: No Has patient had a PCN reaction that required hospitalization No Has patient had a PCN reaction occurring within the last 10 years: No If all of the above answers are "NO", then may proceed with Cephalosporin use.   Marland Kitchen  Crestor [Rosuvastatin Calcium]     Burning sensation  . Ezetimibe-Simvastatin   . Niaspan [Niacin Er]     Fatigues, aches.  . Vytorin [Ezetimibe-Simvastatin]   . Zetia [Ezetimibe]   . Zocor [Simvastatin]   . Welchol [Colesevelam Hcl] Other (See Comments)    Severe bloating even on low dose   Social History   Socioeconomic History  . Marital status: Single    Spouse name: Not on file  . Number of children: Not on file  . Years of education: Not on file  . Highest education level: Not on file  Occupational History  . Not on file  Social Needs  .  Financial resource strain: Not on file  . Food insecurity:    Worry: Not on file    Inability: Not on file  . Transportation needs:    Medical: Not on file    Non-medical: Not on file  Tobacco Use  . Smoking status: Former Smoker    Types: Cigarettes  . Smokeless tobacco: Never Used  Substance and Sexual Activity  . Alcohol use: No  . Drug use: No  . Sexual activity: Not on file  Lifestyle  . Physical activity:    Days per week: Not on file    Minutes per session: Not on file  . Stress: Not on file  Relationships  . Social connections:    Talks on phone: Not on file    Gets together: Not on file    Attends religious service: Not on file    Active member of club or organization: Not on file    Attends meetings of clubs or organizations: Not on file    Relationship status: Not on file  . Intimate partner violence:    Fear of current or ex partner: Not on file    Emotionally abused: Not on file    Physically abused: Not on file    Forced sexual activity: Not on file  Other Topics Concern  . Not on file  Social History Narrative  . Not on file      Review of Systems  All other systems reviewed and are negative.      Objective:   Physical Exam  Constitutional: She appears well-developed and well-nourished.  HENT:  Right Ear: External ear normal.  Left Ear: External ear normal.  Nose: Mucosal edema and rhinorrhea present. Right sinus exhibits maxillary sinus tenderness and frontal sinus tenderness. Left sinus exhibits maxillary sinus tenderness and frontal sinus tenderness.  Mouth/Throat: Oropharynx is clear and moist. No oropharyngeal exudate.  Cardiovascular: Normal rate, regular rhythm and normal heart sounds.  No murmur heard. Pulmonary/Chest: Effort normal. She has no wheezes. She has no rhonchi. She has no rales.  Lymphadenopathy:    She has no cervical adenopathy.  Vitals reviewed.         Assessment & Plan:  Acute bacterial rhinosinusitis  Begin  Levaquin 500 mg by mouth daily for 7 days and continue to use Flonase 2 sprays each nostril daily.  Reassess in 10 days if no better or sooner if worse

## 2018-05-13 ENCOUNTER — Encounter: Payer: Self-pay | Admitting: Physician Assistant

## 2018-05-13 ENCOUNTER — Ambulatory Visit (INDEPENDENT_AMBULATORY_CARE_PROVIDER_SITE_OTHER): Payer: Medicare Other | Admitting: Physician Assistant

## 2018-05-13 VITALS — BP 130/78 | HR 62 | Temp 98.1°F | Resp 14 | Ht 60.5 in | Wt 181.0 lb

## 2018-05-13 DIAGNOSIS — Z0000 Encounter for general adult medical examination without abnormal findings: Secondary | ICD-10-CM | POA: Diagnosis not present

## 2018-05-13 DIAGNOSIS — Z23 Encounter for immunization: Secondary | ICD-10-CM | POA: Diagnosis not present

## 2018-05-13 DIAGNOSIS — E2839 Other primary ovarian failure: Secondary | ICD-10-CM

## 2018-05-13 DIAGNOSIS — I10 Essential (primary) hypertension: Secondary | ICD-10-CM | POA: Diagnosis not present

## 2018-05-13 DIAGNOSIS — Z1231 Encounter for screening mammogram for malignant neoplasm of breast: Secondary | ICD-10-CM

## 2018-05-13 DIAGNOSIS — E785 Hyperlipidemia, unspecified: Secondary | ICD-10-CM

## 2018-05-13 DIAGNOSIS — E559 Vitamin D deficiency, unspecified: Secondary | ICD-10-CM

## 2018-05-13 DIAGNOSIS — Z780 Asymptomatic menopausal state: Secondary | ICD-10-CM

## 2018-05-13 NOTE — Progress Notes (Signed)
Subjective:   Sharon Mcclure is a 77 y.o. female who presents for Medicare Annual (Subsequent) preventive examination.  Review of Systems: Cardiac Risk Factors include:  Age, Hypertension, hyperlipidemia     Objective:     Vitals: BP 130/78   Pulse 62   Temp 98.1 F (36.7 C) (Oral)   Resp 14   Ht 5' 0.5" (1.537 m)   Wt 82.1 kg   SpO2 97%   BMI 34.77 kg/m   Body mass index is 34.77 kg/m.  Today a handout of information is provided to her regarding the following: Advanced directives Living will Healthcare power of attorney   Tobacco Social History   Tobacco Use  Smoking Status Former Smoker  . Types: Cigarettes  Smokeless Tobacco Never Used        Past Medical History:  Diagnosis Date  . Anxiety   . Fatty liver   . GERD (gastroesophageal reflux disease)   . History of skin cancer    Right arm  . HTN (hypertension)   . Hyperlipidemia   . Insomnia   . Pneumonia October 2016  . Rosacea 11/24/2013  . Vitamin D deficiency    Past Surgical History:  Procedure Laterality Date  . APPENDECTOMY    . CATARACT EXTRACTION W/PHACO Left 01/25/2016   Procedure: CATARACT EXTRACTION PHACO AND INTRAOCULAR LENS PLACEMENT (IOC);  Surgeon: Birder Robson, MD;  Location: [facility] ORS;  Service: Ophthalmology;  Laterality: Left;  [place] 33.9 AP% 15.5 CDE 5.28 Fluid Pack Lot # [id number]  . CATARACT EXTRACTION W/PHACO Right 02/22/2016   Procedure: CATARACT EXTRACTION PHACO AND INTRAOCULAR LENS PLACEMENT (IOC);  Surgeon: Birder Robson, MD;  Location: [facility] ORS;  Service: Ophthalmology;  Laterality: Right;  [place] 00:40 AP% 23.6 CDE 9.45 fluid pack lot # [id number]  . TUBAL LIGATION  1981   Family History  Problem Relation Age of Onset  . Heart disease Mother 41       aortic valve- had AVR- died 6 wks after  . Aneurysm Father 30       aortic aneursym  . Heart disease Brother 53       CABG  . Colon cancer Neg Hx   . Stomach cancer Neg Hx    Social History   Socioeconomic  History  . Marital status: Single    Spouse name: Not on file  . Number of children: Not on file  . Years of education: Not on file  . Highest education level: Not on file  Occupational History  . Not on file  Social Needs  . Financial resource strain: Not on file  . Food insecurity:    Worry: Not on file    Inability: Not on file  . Transportation needs:    Medical: Not on file    Non-medical: Not on file  Tobacco Use  . Smoking status: Former Smoker    Types: Cigarettes  . Smokeless tobacco: Never Used  Substance and Sexual Activity  . Alcohol use: No  . Drug use: No  . Sexual activity: Not on file  Lifestyle  . Physical activity:    Days per week: Not on file    Minutes per session: Not on file  . Stress: Not on file  Relationships  . Social connections:    Talks on phone: Not on file    Gets together: Not on file    Attends religious service: Not on file    Active member of club or organization: Not on file  Attends meetings of clubs or organizations: Not on file    Relationship status: Not on file  Other Topics Concern  . Not on file  Social History Narrative  . Not on file    Outpatient Encounter Medications as of 05/13/2018  Medication Sig  . amLODipine (NORVASC) 5 MG tablet TAKE 1 TABLET BY MOUTH ONCE DAILY  . aspirin 81 MG tablet Take 81 mg by mouth daily.    . benzoyl peroxide-erythromycin (BENZAMYCIN) gel APPLY TOPICALLY TO AFFECTED AREA TWICE DAILY  . citalopram (CELEXA) 40 MG tablet TAKE 1 TABLET BY MOUTH ONCE DAILY  . Multiple Vitamin (MULTIVITAMIN) tablet Take 1 tablet by mouth daily.  . pantoprazole (PROTONIX) 40 MG tablet TAKE 1 TABLET BY MOUTH TWICE (2) DAILY  . VITAMIN D, ERGOCALCIFEROL, PO Take 5,000 Units by mouth daily.  . [DISCONTINUED] levofloxacin (LEVAQUIN) 500 MG tablet Take 1 tablet (500 mg total) by mouth daily.   No facility-administered encounter medications on file as of 05/13/2018.     Activities of Daily Living In your  present state of health, do you have any difficulty performing the following activities: 05/13/2018  Hearing? N  Vision? N  Difficulty concentrating or making decisions? N  Walking or climbing stairs? N  Dressing or bathing? N  Doing errands, shopping? N  Preparing Food and eating ? N  Using the Toilet? N  In the past six months, have you accidently leaked urine? N  Do you have problems with loss of bowel control? N  Managing your Medications? N  Managing your Finances? N  Housekeeping or managing your Housekeeping? N  Some recent data might be hidden    Patient Care Team: Susy Frizzle, MD as PCP - General (Family Medicine)  Verified.  She states that she sees no specialist no medical providers on a routine basis other than coming here.     Assessment:   This is a routine wellness examination for [place].  Exercise Activities and Dietary recommendations Current Exercise Habits: The patient does not participate in regular exercise at present    Fall Risk Fall Risk  05/13/2018 04/12/2018 06/04/2017 11/24/2013  Falls in the past year? No No No No   Is the patient's home free of loose throw rugs in walkways, pet beds, electrical cords, etc?   yes      Grab bars in the bathroom? no      Handrails on the stairs?   yes      Adequate lighting?   yes   Depression Screen PHQ 2/9 Scores 05/13/2018 04/12/2018 06/04/2017 11/24/2013  PHQ - 2 Score 4 0 0 0  PHQ- 9 Score 6 - - -     Cognitive Function     6CIT Screen 05/13/2018  What Year? 0 points  What month? 0 points  What time? 0 points  Count back from 20 0 points  Months in reverse 0 points  Repeat phrase 2 points  Total Score 2    Immunization History  Administered Date(s) Administered  . Influenza Whole 05/29/2007  . Influenza, High Dose Seasonal PF 06/01/2014  . Influenza,inj,Quad PF,6+ Mos 06/29/2015  . Influenza-Unspecified 05/28/2013  . Pneumococcal Conjugate-13 04/22/2014  . Pneumococcal Polysaccharide-23  06/12/2005, 09/13/2015  . Zoster 10/08/2007    She is going to get a flu vaccine today.  Other immunizations are up-to-date.  Screening Tests Health Maintenance  Topic Date Due  . TETANUS/TDAP  09/10/1961  . COLONOSCOPY  10/07/2015  . INFLUENZA VACCINE  03/28/2018  . DEXA  SCAN  Completed  . PNA vac Low Risk Adult  Completed    She reports that she has just done the Cologuard and has it to take to the post office to send in now !!! This mammogram was 2 years ago and last DEXA scan was 4 years ago.  She states that she will go for these if I get them scheduled.  Says that otherwise she will keep putting them off.  Today I am putting in an order to schedule both of these and they should be able to get done at the same time date.  She is fasting to check labs.  She has had no labs checked since December 2016 so we will go ahead and recheck those today. Did review that at lab 07/2015 LDL was 175 but it was noted that she has history of intolerance to use all statins Zetia and WelChol.  She has no CAD so do not think needs medication such as Pradaxa.      Plan:      I have personally reviewed and noted the following in the patient's chart:   . Medical and social history . Use of alcohol, tobacco or illicit drugs  . Current medications and supplements . Functional ability and status . Nutritional status . Physical activity . Advanced directives . List of other physicians . Hospitalizations, surgeries, and ER visits in previous 12 months . Vitals . Screenings to include cognitive, depression, and falls . Referrals and appointments  In addition, I have reviewed and discussed with patient certain preventive protocols, quality metrics, and best practice recommendations. A written personalized care plan for preventive services as well as general preventive health recommendations were provided to patient.   1. Medicare annual wellness visit, subsequent - MM DIGITAL SCREENING  BILATERAL; Future - DG BONE DENSITY (DXA); Future - CBC with Differential/Platelet - COMPLETE METABOLIC PANEL WITH GFR - Lipid panel - TSH - VITAMIN D 25 Hydroxy (Vit-D Deficiency, Fractures)  2. Essential hypertension Blood Pressure is well controlled at goal today. - COMPLETE METABOLIC PANEL WITH GFR  3. Hyperlipidemia, unspecified hyperlipidemia type Did review that at lab 07/2015 LDL was 175 but it was noted that she has history of intolerance to use all statins Zetia and WelChol.  She has no CAD so do not think needs medication such as Repatha. - COMPLETE METABOLIC PANEL WITH GFR - Lipid panel  4. Vitamin D deficiency - VITAMIN D 25 Hydroxy (Vit-D Deficiency, Fractures)  5. Breast cancer screening by mammogram - MM DIGITAL SCREENING BILATERAL; Future  6. Postmenopausal - DG BONE DENSITY (DXA); Future  7. Estrogen deficiency - DG BONE DENSITY (DXA); Future   [facility], [place]  05/13/2018

## 2018-05-14 ENCOUNTER — Other Ambulatory Visit: Payer: Self-pay | Admitting: Physician Assistant

## 2018-05-14 DIAGNOSIS — Z1231 Encounter for screening mammogram for malignant neoplasm of breast: Secondary | ICD-10-CM

## 2018-05-14 LAB — VITAMIN D 25 HYDROXY (VIT D DEFICIENCY, FRACTURES): Vit D, 25-Hydroxy: 55 ng/mL (ref 30–100)

## 2018-05-14 LAB — COMPLETE METABOLIC PANEL WITH GFR
AG Ratio: 2.2 (calc) (ref 1.0–2.5)
ALT: 16 U/L (ref 6–29)
AST: 20 U/L (ref 10–35)
Albumin: 4.6 g/dL (ref 3.6–5.1)
Alkaline phosphatase (APISO): 54 U/L (ref 33–130)
BUN: 18 mg/dL (ref 7–25)
CO2: 30 mmol/L (ref 20–32)
Calcium: 10.1 mg/dL (ref 8.6–10.4)
Chloride: 102 mmol/L (ref 98–110)
Creat: 0.77 mg/dL (ref 0.60–0.93)
GFR, Est African American: 88 mL/min/{1.73_m2} (ref >=60)
GFR, Est Non African American: 76 mL/min/{1.73_m2} (ref >=60)
Globulin: 2.1 g/dL (calc) (ref 1.9–3.7)
Glucose, Bld: 101 mg/dL — ABNORMAL HIGH (ref 65–99)
Potassium: 4.6 mmol/L (ref 3.5–5.3)
Sodium: 141 mmol/L (ref 135–146)
Total Bilirubin: 0.5 mg/dL (ref 0.2–1.2)
Total Protein: 6.7 g/dL (ref 6.1–8.1)

## 2018-05-14 LAB — CBC WITH DIFFERENTIAL/PLATELET
Basophils Absolute: 92 cells/uL (ref 0–200)
Basophils Relative: 1.4 %
Eosinophils Absolute: 178 cells/uL (ref 15–500)
Eosinophils Relative: 2.7 %
HCT: 43.4 % (ref 35.0–45.0)
Hemoglobin: 14.8 g/dL (ref 11.7–15.5)
Lymphs Abs: 2251 cells/uL (ref 850–3900)
MCH: 29.2 pg (ref 27.0–33.0)
MCHC: 34.1 g/dL (ref 32.0–36.0)
MCV: 85.6 fL (ref 80.0–100.0)
MPV: 10.3 fL (ref 7.5–12.5)
Monocytes Relative: 7.5 %
Neutro Abs: 3584 cells/uL (ref 1500–7800)
Neutrophils Relative %: 54.3 %
Platelets: 232 10*3/uL (ref 140–400)
RBC: 5.07 10*6/uL (ref 3.80–5.10)
RDW: 13.1 % (ref 11.0–15.0)
Total Lymphocyte: 34.1 %
WBC mixed population: 495 cells/uL (ref 200–950)
WBC: 6.6 10*3/uL (ref 3.8–10.8)

## 2018-05-14 LAB — LIPID PANEL
Cholesterol: 297 mg/dL — ABNORMAL HIGH (ref <200)
HDL: 54 mg/dL (ref >50)
LDL Cholesterol (Calc): 203 mg/dL (calc) — ABNORMAL HIGH
Non-HDL Cholesterol (Calc): 243 mg/dL (calc) — ABNORMAL HIGH (ref <130)
Total CHOL/HDL Ratio: 5.5 (calc) — ABNORMAL HIGH (ref <5.0)
Triglycerides: 210 mg/dL — ABNORMAL HIGH (ref <150)

## 2018-05-14 LAB — TSH: TSH: 2.26 mIU/L (ref 0.40–4.50)

## 2018-05-15 ENCOUNTER — Other Ambulatory Visit: Payer: Self-pay | Admitting: Family Medicine

## 2018-05-31 ENCOUNTER — Telehealth: Payer: Self-pay

## 2018-05-31 NOTE — Telephone Encounter (Signed)
Call was placed to patient to discuss lab results. Patient states she has not tried Repatha , but is willing to try something to help lower her cholesterol

## 2018-05-31 NOTE — Telephone Encounter (Signed)
-----   Message from Orlena Sheldon, PA-C sent at 05/14/2018  8:20 AM EDT ----- Cholesterol was high in the past and is very high again now.  However, in past she has not been able to tolerate statins, zetia, welchol. She has no CAD, PAD, or DM so do not know about using Repatha. All other labs normal.

## 2018-06-03 NOTE — Telephone Encounter (Signed)
Repatha 140mg /ml One dose every 2 weeks  #1 pen +3 refills

## 2018-06-05 MED ORDER — EVOLOCUMAB 140 MG/ML ~~LOC~~ SOSY: 140.0000 mg | PREFILLED_SYRINGE | SUBCUTANEOUS | 3 refills | Status: DC

## 2018-06-05 NOTE — Telephone Encounter (Signed)
Called placed to patient she is aware of repatha that she is to start. I also went over instructions as to how she is to administer the medication as well as how often. Patient states she will either get someone to administer it for her or come by our office to get it administered.

## 2018-06-10 ENCOUNTER — Ambulatory Visit
Admission: RE | Admit: 2018-06-10 | Discharge: 2018-06-10 | Disposition: A | Discharge status: Home / Self Care | Payer: Medicare Other | Source: Ambulatory Visit | Attending: Physician Assistant | Admitting: Physician Assistant

## 2018-06-10 ENCOUNTER — Telehealth: Payer: Self-pay | Admitting: *Deleted

## 2018-06-10 DIAGNOSIS — M85852 Other specified disorders of bone density and structure, left thigh: Secondary | ICD-10-CM | POA: Diagnosis not present

## 2018-06-10 DIAGNOSIS — Z0000 Encounter for general adult medical examination without abnormal findings: Secondary | ICD-10-CM | POA: Diagnosis not present

## 2018-06-10 DIAGNOSIS — E2839 Other primary ovarian failure: Secondary | ICD-10-CM | POA: Insufficient documentation

## 2018-06-10 DIAGNOSIS — Z1231 Encounter for screening mammogram for malignant neoplasm of breast: Secondary | ICD-10-CM | POA: Diagnosis not present

## 2018-06-10 DIAGNOSIS — Z780 Asymptomatic menopausal state: Secondary | ICD-10-CM | POA: Diagnosis not present

## 2018-06-10 LAB — HM MAMMOGRAPHY

## 2018-06-10 NOTE — Telephone Encounter (Signed)
[id number]. REPATHA SURE INJ 140MG /ML is approved through 12/10/2018.

## 2018-06-10 NOTE — Telephone Encounter (Signed)
[employer] is reviewing your PA request. Typically an electronic response will be received within 72 hours. To check for an update later, open this request from your dashboard.    You may close this dialog and return to your dashboard to perform other tasks.

## 2018-06-10 NOTE — Telephone Encounter (Signed)
Received request from pharmacy for [facility] [address].   PA submitted.   Dx: E78.5- HLD- unable to tolerate statin therapy

## 2018-06-20 ENCOUNTER — Other Ambulatory Visit: Payer: Self-pay

## 2018-07-09 ENCOUNTER — Telehealth: Payer: Self-pay | Admitting: Family Medicine

## 2018-07-09 NOTE — Telephone Encounter (Signed)
Patient called in stating that the [place] that was prescribed to her in October was too expensive and she can not tolerate most statin medications.  She would like to know if we can call in Gemfibrozil for her since it is a less expensive medication. Patient stated that a nurse with her insurance came by and recommended this medication. Please advise?

## 2018-07-11 MED ORDER — FENOFIBRATE 160 MG PO TABS: 160.0000 mg | ORAL_TABLET | Freq: Every day | ORAL | 0 refills | Status: DC

## 2018-07-11 NOTE — Telephone Encounter (Signed)
Spoke with patient and informed her of Dr. Samella Parr recommendations and patient verbalized understanding. Fenofibrate sent into patient pharmacy. Patient requested only 30 day supply to see how the medication would work first

## 2018-07-11 NOTE — Telephone Encounter (Signed)
I would recommend fenofibrate instead due to less drug interactions 160 mg poqday

## 2018-08-05 ENCOUNTER — Other Ambulatory Visit: Payer: Self-pay | Admitting: Family Medicine

## 2018-09-04 ENCOUNTER — Other Ambulatory Visit: Payer: Self-pay | Admitting: Family Medicine

## 2018-09-18 ENCOUNTER — Other Ambulatory Visit: Payer: Self-pay | Admitting: Family Medicine

## 2018-11-06 ENCOUNTER — Other Ambulatory Visit: Payer: Self-pay | Admitting: Family Medicine

## 2018-11-12 ENCOUNTER — Telehealth: Payer: Self-pay

## 2018-11-12 NOTE — Telephone Encounter (Signed)
Done. Thank you.

## 2018-11-12 NOTE — Telephone Encounter (Signed)
Copied from [place] [id number]. Topic: Appointment Scheduling - New Patient >> Nov 12, 2018 10:22 AM Scherrie Gerlach wrote: New patient has been scheduled for your office. Provider: Olivia Mackie Date of Appointment: 12/18/2018  Route to department's PEC pool.

## 2018-11-25 ENCOUNTER — Other Ambulatory Visit: Payer: Self-pay | Admitting: Family Medicine

## 2018-12-18 ENCOUNTER — Ambulatory Visit: Payer: Medicare Other | Admitting: Internal Medicine

## 2018-12-19 ENCOUNTER — Ambulatory Visit: Payer: Medicare Other | Admitting: Family Medicine

## 2019-01-24 ENCOUNTER — Ambulatory Visit: Payer: Medicare Other | Admitting: Internal Medicine

## 2019-01-28 ENCOUNTER — Other Ambulatory Visit: Payer: Self-pay | Admitting: Family Medicine

## 2019-02-25 ENCOUNTER — Other Ambulatory Visit: Payer: Self-pay | Admitting: Family Medicine

## 2019-02-27 ENCOUNTER — Other Ambulatory Visit: Payer: Self-pay | Admitting: Family Medicine

## 2019-03-05 ENCOUNTER — Ambulatory Visit (INDEPENDENT_AMBULATORY_CARE_PROVIDER_SITE_OTHER): Payer: Medicare Other | Admitting: Internal Medicine

## 2019-03-05 ENCOUNTER — Other Ambulatory Visit: Payer: Self-pay

## 2019-03-05 ENCOUNTER — Encounter: Payer: Self-pay | Admitting: Internal Medicine

## 2019-03-05 ENCOUNTER — Telehealth: Payer: Self-pay

## 2019-03-05 DIAGNOSIS — M8580 Other specified disorders of bone density and structure, unspecified site: Secondary | ICD-10-CM

## 2019-03-05 DIAGNOSIS — I10 Essential (primary) hypertension: Secondary | ICD-10-CM | POA: Diagnosis not present

## 2019-03-05 DIAGNOSIS — I2510 Atherosclerotic heart disease of native coronary artery without angina pectoris: Secondary | ICD-10-CM

## 2019-03-05 DIAGNOSIS — Z85828 Personal history of other malignant neoplasm of skin: Secondary | ICD-10-CM

## 2019-03-05 DIAGNOSIS — L719 Rosacea, unspecified: Secondary | ICD-10-CM

## 2019-03-05 DIAGNOSIS — K219 Gastro-esophageal reflux disease without esophagitis: Secondary | ICD-10-CM

## 2019-03-05 DIAGNOSIS — E785 Hyperlipidemia, unspecified: Secondary | ICD-10-CM | POA: Diagnosis not present

## 2019-03-05 DIAGNOSIS — Z1329 Encounter for screening for other suspected endocrine disorder: Secondary | ICD-10-CM

## 2019-03-05 DIAGNOSIS — G4700 Insomnia, unspecified: Secondary | ICD-10-CM

## 2019-03-05 DIAGNOSIS — K449 Diaphragmatic hernia without obstruction or gangrene: Secondary | ICD-10-CM

## 2019-03-05 DIAGNOSIS — Z1231 Encounter for screening mammogram for malignant neoplasm of breast: Secondary | ICD-10-CM

## 2019-03-05 DIAGNOSIS — E538 Deficiency of other specified B group vitamins: Secondary | ICD-10-CM

## 2019-03-05 MED ORDER — PANTOPRAZOLE SODIUM 40 MG PO TBEC: 40.0000 mg | DELAYED_RELEASE_TABLET | Freq: Every day | ORAL | 3 refills | Status: DC

## 2019-03-05 MED ORDER — METRONIDAZOLE 0.75 % EX CREA: TOPICAL_CREAM | Freq: Two times a day (BID) | CUTANEOUS | 2 refills | Status: DC

## 2019-03-05 MED ORDER — AMLODIPINE BESYLATE 5 MG PO TABS: 5.0000 mg | ORAL_TABLET | Freq: Every day | ORAL | 3 refills | Status: DC

## 2019-03-05 MED ORDER — TRAZODONE HCL 50 MG PO TABS: 25.0000 mg | ORAL_TABLET | Freq: Every evening | ORAL | 2 refills | Status: DC | PRN

## 2019-03-05 NOTE — Telephone Encounter (Signed)
Error

## 2019-03-05 NOTE — Progress Notes (Signed)
Telephone Note  I connected with Sharon Mcclure  on 03/05/19 at  2:40 PM EDT by a telephone and verified that I am speaking with the correct person using two identifiers.  Location patient: home Location provider:work  Persons participating in the virtual visit: patient, provider  I discussed the limitations of evaluation and management by telemedicine and the availability of in person appointments. The patient expressed understanding and agreed to proceed.   HPI: 1. Rosacea with pimples and h/o skin cancer I.e BCC and pre melanoma per pt she was using benzoyl peroxide erythromycin gel w/o complete relief  She will check which dermatologists are in network she used to see Dr Ileana Ladd who retired and also went to Dr. Evorn Gong he is no longer in her network  2. C/o insomnia esp if wakes up to use the bathroom at night. She has been on Ambien in the past which helped but she did not realize this was addictive and weaned herself off in the past  3. HLD/CAD/HTN-She reports BP controlled on Norvasc 5 mg qd, She prev could not tolerate Crestor, zetia/zocor, vytorin, welchol, fenofibrate which she did not tolerate due to muscle aches and pain h/a and constipation  4. GERD and hiatal hernia on protonix 40 mg bid for a while but she is agreeable to try to reduce to dose   ROS: See pertinent positives and negatives per HPI. General: wt stable  HEENt: no sore throat  CV: no chest pain  GI: +GERD Lungs: no sob  Skin:+ rosacea with pimples  Neuro: memory short term loss at times but thinks ok  MSK: no joint pain  Psych: no anxiety/depression, +insomnia GU: no issues   Past Medical History:  Diagnosis Date  . Anxiety   . CAD (coronary artery disease)   . Fatty liver   . GERD (gastroesophageal reflux disease)   . Hemorrhoids   . Hiatal hernia   . History of skin cancer    Right arm  . HTN (hypertension)   . Hyperlipidemia   . Insomnia   . Insomnia   . Pneumonia October 2016  .  Rosacea 11/24/2013  . Vitamin D deficiency     Past Surgical History:  Procedure Laterality Date  . APPENDECTOMY    . CATARACT EXTRACTION W/PHACO Left 01/25/2016   Procedure: CATARACT EXTRACTION PHACO AND INTRAOCULAR LENS PLACEMENT (IOC);  Surgeon: Birder Robson, MD;  Location: [facility] ORS;  Service: Ophthalmology;  Laterality: Left;  [place] 33.9 AP% 15.5 CDE 5.28 Fluid Pack Lot # [id number]  . CATARACT EXTRACTION W/PHACO Right 02/22/2016   Procedure: CATARACT EXTRACTION PHACO AND INTRAOCULAR LENS PLACEMENT (IOC);  Surgeon: Birder Robson, MD;  Location: [facility] ORS;  Service: Ophthalmology;  Laterality: Right;  [place] 00:40 AP% 23.6 CDE 9.45 fluid pack lot # [id number]  . TUBAL LIGATION  1981    Family History  Problem Relation Age of Onset  . Heart disease Mother 44       aortic valve- had AVR- died 6 wks after  . Aneurysm Father 31       aortic aneursym  . Heart disease Brother 72       CABG  . Colon cancer Neg Hx   . 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  amLODipine (NORVASC) 5 MG tablet, Take 1 tablet (5 mg total) by mouth daily., Disp: 90 tablet,  Rfl: 3 .  aspirin 81 MG tablet, Take 81 mg by mouth daily.  , Disp: , Rfl:  .  citalopram (CELEXA) 40 MG tablet, TAKE 1 TABLET BY MOUTH ONCE DAILY, Disp: 90 tablet, Rfl: 3 .  Multiple Vitamin (MULTIVITAMIN) tablet, Take 1 tablet by mouth daily., Disp: , Rfl:  .  pantoprazole (PROTONIX) 40 MG tablet, Take 1 tablet (40 mg total) by mouth daily. 30 minute before food, Disp: 90 tablet, Rfl: 3 .  VITAMIN D, ERGOCALCIFEROL, PO, Take 5,000 Units by mouth daily., Disp: , Rfl:  .  metroNIDAZOLE (METROCREAM) 0.75 % cream, Apply topically 2 (two) times daily., Disp: 45 g, Rfl: 2 .  traZODone (DESYREL) 50 MG tablet, Take 0.5-1 tablets (25-50 mg total) by mouth at bedtime as needed for sleep., Disp: 30 tablet, Rfl: 2  EXAM:  VITALS per  patient if applicable:  GENERAL: alert, oriented, appears well and in no acute distress  PSYCH/NEURO: pleasant and cooperative, no obvious depression or anxiety, speech and thought processing grossly intact  ASSESSMENT AND PLAN:  Discussed the following assessment and plan:  Essential hypertension - Plan: Ambulatory referral to Cardiology, Comprehensive metabolic panel, CBC with Differential/Platelet, Lipid panel, Urinalysis, Routine w reflex microscopic, amLODipine (NORVASC) 5 MG tablet  Coronary artery disease involving native coronary artery of native heart without angina pectoris - Plan: Ambulatory referral to Cardiology to consider repatha   Hyperlipidemia, unspecified hyperlipidemia type - Plan: Ambulatory referral to Cardiology to consider repatha prev did not tolerate statin nor zetia  Thyroid disorder screen - Plan: TSH  Rosacea - Plan: metroNIDAZOLE (METROCREAM) 0.75 % cream bid  -Will need referral to dermatology in future pt to let me know who is in network  History of skin cancer  Hypomagnesemia - Plan: Magnesium  B12 deficiency - Plan: Vitamin B12  Gastroesophageal reflux disease, esophagitis presence not specified - Plan: pantoprazole (PROTONIX) 40 MG tabletreduce from bid to qd  Hiatal hernia - Plan: pantoprazole (PROTONIX) 40 MG tablet  Insomnia, unspecified type - Plan: traZODone (DESYREL) 25-50 MG tablet qhs prn   HM Flu shot utd  prevnar utd  pna had in 2017  Consider shingrix and Tdap faxed to pharmacy to do in the future  sch fasting labs  Mammogram neg 06/10/18  Out of age window pap 04/22/14 negative  DEXA 06/10/18 osteopenia  Colonoscopy In 2007 hemorrhoids had FIT test negative 07/06/17 Skin Dr. Patterson/henderson/Dasher h/o skin cancer  -pt will check insurance about who is in network    I discussed the assessment and treatment plan with the patient. The patient was provided an opportunity to ask questions and all were answered. The patient  agreed with the plan and demonstrated an understanding of the instructions.   The patient was advised to call back or seek an in-person evaluation if the symptoms worsen or if the condition fails to improve as anticipated.  Time spent 25 minutes  Delorise Jackson, MD

## 2019-03-05 NOTE — Patient Instructions (Addendum)
Food Choices for Gastroesophageal Reflux Disease, Adult When you have gastroesophageal reflux disease (GERD), the foods you eat and your eating habits are very important. Choosing the right foods can help ease the discomfort of GERD. Consider working with a diet and nutrition specialist (dietitian) to help you make healthy food choices. What general guidelines should I follow?  Eating plan  Choose healthy foods low in fat, such as fruits, vegetables, whole grains, low-fat dairy products, and lean meat, fish, and poultry.  Eat frequent, small meals instead of three large meals each day. Eat your meals slowly, in a relaxed setting. Avoid bending over or lying down until 2-3 hours after eating.  Limit high-fat foods such as fatty meats or fried foods.  Limit your intake of oils, butter, and shortening to less than 8 teaspoons each day.  Avoid the following: ? Foods that cause symptoms. These may be different for different people. Keep a food diary to keep track of foods that cause symptoms. ? Alcohol. ? Drinking large amounts of liquid with meals. ? Eating meals during the 2-3 hours before bed.  Cook foods using methods other than frying. This may include baking, grilling, or broiling. Lifestyle  Maintain a healthy weight. Ask your health care provider what weight is healthy for you. If you need to lose weight, work with your health care provider to do so safely.  Exercise for at least 30 minutes on 5 or more days each week, or as told by your health care provider.  Avoid wearing clothes that fit tightly around your waist and chest.  Do not use any products that contain nicotine or tobacco, such as cigarettes and e-cigarettes. If you need help quitting, ask your health care provider.  Sleep with the head of your bed raised. Use a wedge under the mattress or blocks under the bed frame to raise the head of the bed. What foods are not recommended? The items listed may not be a complete  list. Talk with your dietitian about what dietary choices are best for you. Grains Pastries or quick breads with added fat. [place] toast. Vegetables Deep fried vegetables. [place] fries. Any vegetables prepared with added fat. Any vegetables that cause symptoms. For some people this may include tomatoes and tomato products, chili peppers, onions and garlic, and horseradish. Fruits Any fruits prepared with added fat. Any fruits that cause symptoms. For some people this may include citrus fruits, such as oranges, grapefruit, pineapple, and lemons. Meats and other protein foods High-fat meats, such as fatty beef or pork, hot dogs, ribs, ham, sausage, salami and bacon. Fried meat or protein, including fried fish and fried chicken. Nuts and nut butters. Dairy Whole milk and chocolate milk. Sour cream. Cream. Ice cream. Cream cheese. Milk shakes. Beverages Coffee and tea, with or without caffeine. Carbonated beverages. Sodas. Energy drinks. Fruit juice made with acidic fruits (such as orange or grapefruit). Tomato juice. Alcoholic drinks. Fats and oils Butter. Margarine. Shortening. Ghee. Sweets and desserts Chocolate and cocoa. Donuts. Seasoning and other foods Pepper. Peppermint and spearmint. Any condiments, herbs, or seasonings that cause symptoms. For some people, this may include curry, hot sauce, or vinegar-based salad dressings. Summary  When you have gastroesophageal reflux disease (GERD), food and lifestyle choices are very important to help ease the discomfort of GERD.  Eat frequent, small meals instead of three large meals each day. Eat your meals slowly, in a relaxed setting. Avoid bending over or lying down until 2-3 hours after eating.  Limit high-fat  foods such as fatty meat or fried foods. This information is not intended to replace advice given to you by your health care provider. Make sure you discuss any questions you have with your health care provider. Document Released:  08/14/2005 Document Revised: 12/05/2018 Document Reviewed: 08/15/2016 Elsevier Patient Education  [place].  Recombinant Zoster (Shingles) Vaccine: What You Need to Know 1. Why get vaccinated? Recombinant zoster (shingles) vaccine can prevent shingles. Shingles (also called herpes zoster, or just zoster) is a painful skin rash, usually with blisters. In addition to the rash, shingles can cause fever, headache, chills, or upset stomach. More rarely, shingles can lead to pneumonia, hearing problems, blindness, brain inflammation (encephalitis), or death. The most common complication of shingles is long-term nerve pain called postherpetic neuralgia (PHN). PHN occurs in the areas where the shingles rash was, even after the rash clears up. It can last for months or years after the rash goes away. The pain from PHN can be severe and debilitating. About 10 to 18% of people who get shingles will experience PHN. The risk of PHN increases with age. An older adult with shingles is more likely to develop PHN and have longer lasting and more severe pain than a younger person with shingles. Shingles is caused by the varicella zoster virus, the same virus that causes chickenpox. After you have chickenpox, the virus stays in your body and can cause shingles later in life. Shingles cannot be passed from one person to another, but the virus that causes shingles can spread and cause chickenpox in someone who had never had chickenpox or received chickenpox vaccine. 2. Recombinant shingles vaccine Recombinant shingles vaccine provides strong protection against shingles. By preventing shingles, recombinant shingles vaccine also protects against PHN. Recombinant shingles vaccine is the preferred vaccine for the prevention of shingles. However, a different vaccine, live shingles vaccine, may be used in some circumstances. The recombinant shingles vaccine is recommended for adults 50 years and older without serious  immune problems. It is given as a two-dose series. This vaccine is also recommended for people who have already gotten another type of shingles vaccine, the live shingles vaccine. There is no live virus in this vaccine. Shingles vaccine may be given at the same time as other vaccines. 3. Talk with your health care provider Tell your vaccine provider if the person getting the vaccine:  Has had an allergic reaction after a previous dose of recombinant shingles vaccine, or has any severe, life-threatening allergies.  Is pregnant or breastfeeding.  Is currently experiencing an episode of shingles. In some cases, your health care provider may decide to postpone shingles vaccination to a future visit. People with minor illnesses, such as a cold, may be vaccinated. People who are moderately or severely ill should usually wait until they recover before getting recombinant shingles vaccine. Your health care provider can give you more information. 4. Risks of a vaccine reaction  A sore arm with mild or moderate pain is very common after recombinant shingles vaccine, affecting about 80% of vaccinated people. Redness and swelling can also happen at the site of the injection.  Tiredness, muscle pain, headache, shivering, fever, stomach pain, and nausea happen after vaccination in more than half of people who receive recombinant shingles vaccine. In clinical trials, about 1 out of 6 people who got recombinant zoster vaccine experienced side effects that prevented them from doing regular activities. Symptoms usually went away on their own in 2 to 3 days. You should still get the second  dose of recombinant zoster vaccine even if you had one of these reactions after the first dose. People sometimes faint after medical procedures, including vaccination. Tell your provider if you feel dizzy or have vision changes or ringing in the ears. As with any medicine, there is a very remote chance of a vaccine causing a  severe allergic reaction, other serious injury, or death. 5. What if there is a serious problem? An allergic reaction could occur after the vaccinated person leaves the clinic. If you see signs of a severe allergic reaction (hives, swelling of the face and throat, difficulty breathing, a fast heartbeat, dizziness, or weakness), call 9-1-1 and get the person to the nearest hospital. For other signs that concern you, call your health care provider. Adverse reactions should be reported to the Vaccine Adverse Event Reporting System (VAERS). Your health care provider will usually file this report, or you can do it yourself. Visit the VAERS website at www.vaers.SamedayNews.es or call 5598453651. VAERS is only for reporting reactions, and VAERS staff do not give medical advice. 6. How can I learn more?  Ask your health care provider.  Call your local or state health department.  Contact the Centers for Disease Control and Prevention (CDC): ? Call 916-185-9722 (1-800-CDC-INFO) or ? Visit CDC's website at http://hunter.com/ Vaccine Information Statement Recombinant Zoster Vaccine (06/26/2018) This information is not intended to replace advice given to you by your health care provider. Make sure you discuss any questions you have with your health care provider. Document Released: 10/24/2016 Document Revised: 12/03/2018 Document Reviewed: 03/20/2018 Elsevier Patient Education  [place] is this medicine? EVOLOCUMAB (e voe LOK ue mab) is known as a PCSK9 inhibitor. It is used to lower the level of cholesterol in the blood. It may be used alone or in combination with other cholesterol-lowering drugs. This drug may also be used to reduce the risk of heart attack, stroke, and certain types of heart surgery in patients with heart disease. This medicine may be used for other purposes; ask your health care provider or pharmacist if you have questions. COMMON BRAND  NAME(S): Repatha What should I tell my health care provider before I take this medicine? They need to know if you have any of these conditions:  an unusual or allergic reaction to evolocumab, other medicines, foods, dyes, or preservatives  pregnant or trying to get pregnant  breast-feeding How should I use this medicine? This medicine is for injection under the skin. You will be taught how to prepare and give this medicine. Use exactly as directed. Take your medicine at regular intervals. Do not take your medicine more often than directed. It is important that you put your used needles and syringes in a special sharps container. Do not put them in a trash can. If you do not have a sharps container, call your pharmacist or health care provider to get one. Talk to your pediatrician regarding the use of this medicine in children. While this drug may be prescribed for children as young as 13 years for selected conditions, precautions do apply. Overdosage: If you think you have taken too much of this medicine contact a poison control center or emergency room at once. NOTE: This medicine is only for you. Do not share this medicine with others. What if I miss a dose? If you miss a dose, take it as soon as you can if there are more than 7 days until the next scheduled dose, or skip the  missed dose and take the next dose according to your original schedule. Do not take double or extra doses. What may interact with this medicine? Interactions are not expected. This list may not describe all possible interactions. Give your health care provider a list of all the medicines, herbs, non-prescription drugs, or dietary supplements you use. Also tell them if you smoke, drink alcohol, or use illegal drugs. Some items may interact with your medicine. What should I watch for while using this medicine? You may need blood work while you are taking this medicine. What side effects may I notice from receiving this  medicine? Side effects that you should report to your doctor or health care professional as soon as possible:  allergic reactions like skin rash, itching or hives, swelling of the face, lips, or tongue  signs and symptoms of high blood sugar such as dizziness; dry mouth; dry skin; fruity breath; nausea; stomach pain; increased hunger or thirst; increased urination  signs and symptoms of infection like fever or chills; cough; sore throat; pain or trouble passing urine Side effects that usually do not require medical attention (report to your doctor or health care professional if they continue or are bothersome):  diarrhea  nausea  muscle pain  pain, redness, or irritation at site where injected This list may not describe all possible side effects. Call your doctor for medical advice about side effects. You may report side effects to FDA at 1-800-FDA-1088. Where should I keep my medicine? Keep out of the reach of children. You will be instructed on how to store this medicine. Throw away any unused medicine after the expiration date on the label. NOTE: This sheet is a summary. It may not cover all possible information. If you have questions about this medicine, talk to your doctor, pharmacist, or health care provider.  2020 Elsevier/Gold Standard (2017-03-26 13:31:00)  Td Vaccine (Tetanus and Diphtheria): What You Need to Know 1. Why get vaccinated? Tetanus  and diphtheria are very serious diseases. They are rare in the [place] today, but people who do become infected often have severe complications. Td vaccine is used to protect adolescents and adults from both of these diseases. Both tetanus and diphtheria are infections caused by bacteria. Diphtheria spreads from person to person through coughing or sneezing. Tetanus-causing bacteria enter the body through cuts, scratches, or wounds. TETANUS (Lockjaw) causes painful muscle tightening and stiffness, usually all over the body.  It  can lead to tightening of muscles in the head and neck so you can't open your mouth, swallow, or sometimes even breathe. Tetanus kills about 1 out of every 10 people who are infected even after receiving the best medical care. DIPHTHERIA can cause a thick coating to form in the back of the throat.  It can lead to breathing problems, paralysis, heart failure, and death. Before vaccines, as many as 200,000 cases of diphtheria and hundreds of cases of tetanus were reported in the [place] each year. Since vaccination began, reports of cases for both diseases have dropped by about 99%. 2. Td vaccine Td vaccine can protect adolescents and adults from tetanus and diphtheria. Td is usually given as a booster dose every 10 years but it can also be given earlier after a severe and dirty wound or burn. Another vaccine, called Tdap, which protects against pertussis in addition to tetanus and diphtheria, is sometimes recommended instead of Td vaccine. Your doctor or the person giving you the vaccine can give you more information. Td may  safely be given at the same time as other vaccines. 3. Some people should not get this vaccine  A person who has ever had a life-threatening allergic reaction after a previous dose of any tetanus or diphtheria containing vaccine, OR has a severe allergy to any part of this vaccine, should not get Td vaccine. Tell the person giving the vaccine about any severe allergies.  Talk to your doctor if you: ? had severe pain or swelling after any vaccine containing diphtheria or tetanus, ? ever had a condition called Guillain Barr Syndrome (GBS), ? aren't feeling well on the day the shot is scheduled. 4. Risks of a vaccine reaction With any medicine, including vaccines, there is a chance of side effects. These are usually mild and go away on their own. Serious reactions are also possible but are rare. Most people who get Td vaccine do not have any problems with it. Mild  Problems following Td vaccine: (Did not interfere with activities)  Pain where the shot was given (about 8 people in 10)  Redness or swelling where the shot was given (about 1 person in 4)  Mild fever (rare)  Headache (about 1 person in 4)  Tiredness (about 1 person in 4) Moderate Problems following Td vaccine: (Interfered with activities, but did not require medical attention)  Fever over 102F (rare) Severe Problems following Td vaccine: (Unable to perform usual activities; required medical attention)  Swelling, severe pain, bleeding and/or redness in the arm where the shot was given (rare). Problems that could happen after any vaccine:  People sometimes faint after a medical procedure, including vaccination. Sitting or lying down for about 15 minutes can help prevent fainting, and injuries caused by a fall. Tell your doctor if you feel dizzy, or have vision changes or ringing in the ears.  Some people get severe pain in the shoulder and have difficulty moving the arm where a shot was given. This happens very rarely.  Any medication can cause a severe allergic reaction. Such reactions from a vaccine are very rare, estimated at fewer than 1 in a million doses, and would happen within a few minutes to a few hours after the vaccination. As with any medicine, there is a very remote chance of a vaccine causing a serious injury or death. The safety of vaccines is always being monitored. For more information, visit: http://www.aguilar.org/ 5. What if there is a serious reaction? What should I look for?  Look for anything that concerns you, such as signs of a severe allergic reaction, very high fever, or unusual behavior. Signs of a severe allergic reaction can include hives, swelling of the face and throat, difficulty breathing, a fast heartbeat, dizziness, and weakness. These would usually start a few minutes to a few hours after the vaccination. What should I do?  If you think it  is a severe allergic reaction or other emergency that can't wait, call 9-1-1 or get the person to the nearest hospital. Otherwise, call your doctor.  Afterward, the reaction should be reported to the Vaccine Adverse Event Reporting System (VAERS). Your doctor might file this report, or you can do it yourself through the VAERS web site at www.vaers.SamedayNews.es, or by calling (865)027-1407. VAERS does not give medical advice. 6. The National Vaccine Injury Compensation Program The [employer] Vaccine Injury Compensation Program (VICP) is a federal program that was created to compensate people who may have been injured by certain vaccines. Persons who believe they may have been injured by a vaccine can  learn about the program and about filing a claim by calling 934-813-9897 or visiting the [place] website at GoldCloset.com.ee. There is a time limit to file a claim for compensation. 7. How can I learn more?  Ask your doctor. He or she can give you the vaccine package insert or suggest other sources of information.  Call your local or state health department.  Contact the Centers for Disease Control and Prevention (CDC): ? Call 410-738-1102 (1-800-CDC-INFO) ? Visit CDC's website at http://hunter.com/ Vaccine Information Statement Td Vaccine (12/07/15) This information is not intended to replace advice given to you by your health care provider. Make sure you discuss any questions you have with your health care provider. Document Released: 06/11/2006 Document Revised: 04/01/2018 Document Reviewed: 04/01/2018 Elsevier Interactive Patient Education  [employer].

## 2019-03-06 ENCOUNTER — Encounter: Payer: Self-pay | Admitting: Internal Medicine

## 2019-04-08 DIAGNOSIS — D229 Melanocytic nevi, unspecified: Secondary | ICD-10-CM | POA: Diagnosis not present

## 2019-04-08 DIAGNOSIS — D489 Neoplasm of uncertain behavior, unspecified: Secondary | ICD-10-CM | POA: Diagnosis not present

## 2019-04-08 DIAGNOSIS — L821 Other seborrheic keratosis: Secondary | ICD-10-CM | POA: Diagnosis not present

## 2019-04-08 DIAGNOSIS — L281 Prurigo nodularis: Secondary | ICD-10-CM | POA: Diagnosis not present

## 2019-04-08 DIAGNOSIS — L280 Lichen simplex chronicus: Secondary | ICD-10-CM | POA: Diagnosis not present

## 2019-04-08 DIAGNOSIS — L814 Other melanin hyperpigmentation: Secondary | ICD-10-CM | POA: Diagnosis not present

## 2019-04-16 ENCOUNTER — Other Ambulatory Visit: Payer: Medicare Other

## 2019-04-24 ENCOUNTER — Telehealth: Payer: Self-pay | Admitting: Internal Medicine

## 2019-04-24 NOTE — Telephone Encounter (Signed)
Medication Refill - Medication: Vitamin D2  Has the patient contacted their pharmacy? No - she needs new refill, she thinks she uses this for nerves and itching (Agent: If no, request that the patient contact the pharmacy for the refill.) (Agent: If yes, when and what did the pharmacy advise?)  Preferred Pharmacy (with phone number or street name):  [facility] [place] [facility] [address] [phone] (Phone) [phone] (Fax)   Agent: Please be advised that RX refills may take up to 3 business days. We ask that you follow-up with your pharmacy.

## 2019-04-24 NOTE — Telephone Encounter (Signed)
Spoke with patient and she states that she was suppose to be on Vitamin D2. Patient thinks that it was prescribed by her dermatologist a long time ago and she never got it. Please advise if you are able to fill them Vitamin D2 or if patient doesn't need it.

## 2019-04-25 NOTE — Telephone Encounter (Signed)
Vitamin D2 is not needed. She takes vitamin D3 po daily. Last vit D level was normal. Patient is aware and gave verbal understanding

## 2019-04-30 ENCOUNTER — Other Ambulatory Visit (INDEPENDENT_AMBULATORY_CARE_PROVIDER_SITE_OTHER): Payer: Medicare Other

## 2019-04-30 ENCOUNTER — Other Ambulatory Visit: Payer: Self-pay

## 2019-04-30 DIAGNOSIS — I10 Essential (primary) hypertension: Secondary | ICD-10-CM | POA: Diagnosis not present

## 2019-04-30 DIAGNOSIS — E538 Deficiency of other specified B group vitamins: Secondary | ICD-10-CM

## 2019-04-30 DIAGNOSIS — Z1329 Encounter for screening for other suspected endocrine disorder: Secondary | ICD-10-CM | POA: Diagnosis not present

## 2019-04-30 LAB — COMPREHENSIVE METABOLIC PANEL
ALT: 17 U/L (ref 0–35)
AST: 22 U/L (ref 0–37)
Albumin: 4.3 g/dL (ref 3.5–5.2)
Alkaline Phosphatase: 47 U/L (ref 39–117)
BUN: 15 mg/dL (ref 6–23)
CO2: 30 mEq/L (ref 19–32)
Calcium: 10.1 mg/dL (ref 8.4–10.5)
Chloride: 101 mEq/L (ref 96–112)
Creatinine, Ser: 0.91 mg/dL (ref 0.40–1.20)
GFR: 60 mL/min — ABNORMAL LOW (ref >60.00)
Glucose, Bld: 88 mg/dL (ref 70–99)
Potassium: 4.4 mEq/L (ref 3.5–5.1)
Sodium: 140 mEq/L (ref 135–145)
Total Bilirubin: 0.5 mg/dL (ref 0.2–1.2)
Total Protein: 7 g/dL (ref 6.0–8.3)

## 2019-04-30 LAB — CBC WITH DIFFERENTIAL/PLATELET
Basophils Absolute: 0.1 10*3/uL (ref 0.0–0.1)
Basophils Relative: 1.2 % (ref 0.0–3.0)
Eosinophils Absolute: 0.3 10*3/uL (ref 0.0–0.7)
Eosinophils Relative: 4.1 % (ref 0.0–5.0)
HCT: 42.8 % (ref 36.0–46.0)
Hemoglobin: 14.3 g/dL (ref 12.0–15.0)
Lymphocytes Relative: 37.2 % (ref 12.0–46.0)
Lymphs Abs: 2.7 10*3/uL (ref 0.7–4.0)
MCHC: 33.3 g/dL (ref 30.0–36.0)
MCV: 86 fl (ref 78.0–100.0)
Monocytes Absolute: 0.6 10*3/uL (ref 0.1–1.0)
Monocytes Relative: 8.9 % (ref 3.0–12.0)
Neutro Abs: 3.5 10*3/uL (ref 1.4–7.7)
Neutrophils Relative %: 48.6 % (ref 43.0–77.0)
Platelets: 294 10*3/uL (ref 150.0–400.0)
RBC: 4.98 Mil/uL (ref 3.87–5.11)
RDW: 14.5 % (ref 11.5–15.5)
WBC: 7.3 10*3/uL (ref 4.0–10.5)

## 2019-04-30 LAB — LIPID PANEL
Cholesterol: 251 mg/dL — ABNORMAL HIGH (ref 0–200)
HDL: 50.5 mg/dL (ref >39.00)
LDL Cholesterol: 162 mg/dL — ABNORMAL HIGH (ref 0–99)
NonHDL: 200.47
Total CHOL/HDL Ratio: 5
Triglycerides: 194 mg/dL — ABNORMAL HIGH (ref 0.0–149.0)
VLDL: 38.8 mg/dL (ref 0.0–40.0)

## 2019-04-30 LAB — MAGNESIUM: Magnesium: 1.8 mg/dL (ref 1.5–2.5)

## 2019-04-30 LAB — VITAMIN B12: Vitamin B-12: 216 pg/mL (ref 211–911)

## 2019-04-30 LAB — TSH: TSH: 2.09 u[IU]/mL (ref 0.35–4.50)

## 2019-05-01 LAB — URINALYSIS, ROUTINE W REFLEX MICROSCOPIC
Bacteria, UA: NONE SEEN /HPF
Bilirubin Urine: NEGATIVE
Glucose, UA: NEGATIVE
Hgb urine dipstick: NEGATIVE
Hyaline Cast: NONE SEEN /LPF
Ketones, ur: NEGATIVE
Nitrite: NEGATIVE
Protein, ur: NEGATIVE
Specific Gravity, Urine: 1.022 (ref 1.001–1.03)
pH: <=5 (ref 5.0–8.0)

## 2019-05-01 NOTE — Progress Notes (Signed)
Patient taking half of her fenofibrate, 80mg .

## 2019-05-08 ENCOUNTER — Ambulatory Visit: Payer: Medicare Other | Admitting: Internal Medicine

## 2019-05-15 ENCOUNTER — Other Ambulatory Visit: Payer: Self-pay

## 2019-05-15 ENCOUNTER — Ambulatory Visit (INDEPENDENT_AMBULATORY_CARE_PROVIDER_SITE_OTHER): Payer: Medicare Other

## 2019-05-15 DIAGNOSIS — Z0000 Encounter for general adult medical examination without abnormal findings: Secondary | ICD-10-CM | POA: Diagnosis not present

## 2019-05-15 NOTE — Progress Notes (Signed)
Subjective:   Sharon Mcclure is a 78 y.o. female who presents for Medicare Annual (Subsequent) preventive examination.  Review of Systems:  No ROS.  Medicare Wellness Virtual Visit.  Visual/audio telehealth visit, UTA vital signs.   See social history for additional risk factors.   Cardiac Risk Factors include: advanced age (>28men, >76 women);hypertension     Objective:     Vitals: There were no vitals taken for this visit.  There is no height or weight on file to calculate BMI.  Advanced Directives 05/15/2019 05/13/2018 02/22/2016 01/25/2016 06/28/2015 06/27/2015  Does Patient Have a Medical Advance Directive? No No Yes Yes No No  Type of Advance Directive - Public librarian;Living will Living will - -  Would patient like information on creating a medical advance directive? Yes (MAU/Ambulatory/Procedural Areas - Information given) Yes (Inpatient - patient requests chaplain consult to create a medical advance directive) - - - No - patient declined information    Tobacco Social History   Tobacco Use  Smoking Status Former Smoker  . Types: Cigarettes  Smokeless Tobacco Never Used     Counseling given: Not Answered   Clinical Intake:  Pre-visit preparation completed: Yes        Diabetes: No  How often do you need to have someone help you when you read instructions, pamphlets, or other written materials from your doctor or pharmacy?: 1 - Never  Interpreter Needed?: No     Past Medical History:  Diagnosis Date  . Anxiety   . CAD (coronary artery disease)   . Fatty liver   . GERD (gastroesophageal reflux disease)   . Hemorrhoids   . Hiatal hernia   . History of skin cancer    Right arm  . HTN (hypertension)   . Hyperlipidemia   . Insomnia   . Insomnia   . Pneumonia October 2016  . Rosacea 11/24/2013  . Vitamin D deficiency    Past Surgical History:  Procedure Laterality Date  . APPENDECTOMY    . CATARACT EXTRACTION W/PHACO Left  01/25/2016   Procedure: CATARACT EXTRACTION PHACO AND INTRAOCULAR LENS PLACEMENT (IOC);  Surgeon: Birder Robson, MD;  Location: [facility] ORS;  Service: Ophthalmology;  Laterality: Left;  [place] 33.9 AP% 15.5 CDE 5.28 Fluid Pack Lot # [id number]  . CATARACT EXTRACTION W/PHACO Right 02/22/2016   Procedure: CATARACT EXTRACTION PHACO AND INTRAOCULAR LENS PLACEMENT (IOC);  Surgeon: Birder Robson, MD;  Location: [facility] ORS;  Service: Ophthalmology;  Laterality: Right;  [place] 00:40 AP% 23.6 CDE 9.45 fluid pack lot # [id number]  . TUBAL LIGATION  1981   Family History  Problem Relation Age of Onset  . Heart disease Mother 68       aortic valve- had AVR- died 6 wks after  . Aneurysm Father 39       aortic aneursym  . Heart disease Brother 65       CABG  . Colon cancer Neg Hx   . Stomach cancer Neg Hx    Social History   Socioeconomic History  . Marital status: Single    Spouse name: Not on file  . Number of children: Not on file  . Years of education: Not on file  . Highest education level: Not on file  Occupational History  . Not on file  Social Needs  . Financial resource strain: Not hard at all  . Food insecurity    Worry: Never true    Inability: Never true  . Transportation needs  Medical: No    Non-medical: No  Tobacco Use  . Smoking status: Former Smoker    Types: Cigarettes  . Smokeless tobacco: Never Used  Substance and Sexual Activity  . Alcohol use: No  . Drug use: No  . Sexual activity: Not on file  Lifestyle  . Physical activity    Days per week: 2 days    Minutes per session: 20 min  . Stress: Not at all  Relationships  . Social Herbalist on phone: Not on file    Gets together: Not on file    Attends religious service: Not on file    Active member of club or organization: Not on file    Attends meetings of clubs or organizations: Not on file    Relationship status: Not on file  Other Topics Concern  . 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 amLODipine (NORVASC) 5 MG tablet Take 1 tablet (5 mg total) by mouth daily.  Marland Kitchen aspirin 81 MG tablet Take 81 mg by mouth daily.    . Biotin 10000 MCG TABS Take 1 tablet by mouth daily.  . Calcium Carb-Cholecalciferol (CALCIUM+D3 PO) Take 1 tablet by mouth daily.  . citalopram (CELEXA) 40 MG tablet TAKE 1 TABLET BY MOUTH ONCE DAILY  . cyanocobalamin 1000 MCG tablet Take 1,000 mcg by mouth daily.  . FENOFIBRATE PO Take 180 mg by mouth daily.  . metroNIDAZOLE (METROCREAM) 0.75 % cream Apply topically 2 (two) times daily.  . Multiple Vitamin (MULTIVITAMIN) tablet Take 1 tablet by mouth daily.  . pantoprazole (PROTONIX) 40 MG tablet Take 1 tablet (40 mg total) by mouth daily. 30 minute before food  . traZODone (DESYREL) 50 MG tablet Take 0.5-1 tablets (25-50 mg total) by mouth at bedtime as needed for sleep.  Marland Kitchen triamcinolone ointment (KENALOG) 0.1 % Apply topically.  Marland Kitchen VITAMIN D, ERGOCALCIFEROL, PO Take 5,000 Units by mouth daily.   No facility-administered encounter medications on file as of 05/15/2019.     Activities of Daily Living In your present state of health, do you have any difficulty performing the following activities: 05/15/2019  Hearing? N  Vision? N  Difficulty concentrating or making decisions? N  Walking or climbing stairs? N  Dressing or bathing? N  Doing errands, shopping? Y  Comment She does not Physiological scientist and eating ? N  Using the Toilet? N  In the past six months, have you accidently leaked urine? N  Do you have problems with loss of bowel control? N  Managing your Medications? N  Managing your Finances? N  Housekeeping or managing your Housekeeping? N  Some recent data might be hidden    Patient Care Team: McLean-Scocuzza, Nino Glow, MD as PCP - General (Internal Medicine)    Assessment:   This is a routine  wellness examination for [name].  I connected with patient 05/15/19 at 10:30 AM EDT by an audio enabled telemedicine application and verified that I am speaking with the correct person using two identifiers. Patient stated full name and DOB. Patient gave permission to continue with virtual visit. Patient's location was at home and Nurse's location was at [place] office.   Health Maintenance Due: Influenza vaccine 2020- discussed; to be completed in season with doctor or local pharmacy.  Update all pending maintenance due as appropriate.   See completed HM at the end of note.   Eye: Visual acuity not assessed. Virtual visit. Wears corrective lenses. Followed by their ophthalmologist every 12 months.   Dental: Dentures- yes  Hearing: Demonstrates normal hearing during visit.  Safety:  Patient feels safe at home- yes Patient does have smoke detectors at home- yes Patient does wear sunscreen or protective clothing when in direct sunlight - yes Patient does wear seat belt when in a moving vehicle - yes Patient drives- no Adequate lighting in walkways free from debris- yes Grab bars and handrails used as appropriate- yes Ambulates with no assistive device Cell phone or lifeline/life alert/medic alert on person when ambulating outside of the home- no. She intends to get a cell phone.   Social: Alcohol intake - no     Smoking history- former    Smokers in home? none Illicit drug use? none  Depression: PHQ 2 &9 complete. See screening below. Denies irritability, anhedonia, sadness/tearfullness.  Stable.   Falls: See screening below.    Medication: Taking as directed and without issues.   Covid-19: Precautions and sickness symptoms discussed. Wears mask, social distancing, hand hygiene as appropriate.   Activities of Daily Living Patient denies needing assistance with: household chores, feeding themselves, getting from bed to chair, getting to the toilet, bathing/showering,  dressing, managing money, or preparing meals.   Memory: Patient is alert. Patient denies difficulty focusing or concentrating. Correctly identified the president of the [place], season and recall. Patient likes to read for brain stimulation.  BMI- discussed the importance of a healthy diet, water intake and the benefits of aerobic exercise.  Educational material provided.  Physical activity- no routine.   Diet:  Low cholesterol Water: good intake Caffeine: 2 cups of caffeine Ensure/Protein supplement: yes  Advanced Directive: End of life planning; Advance aging; Advanced directives discussed.  Copy of current HCPOA/Living Will requested.    Other Providers Patient Care Team: McLean-Scocuzza, Nino Glow, MD as PCP - General (Internal Medicine)  Exercise Activities and Dietary recommendations Current Exercise Habits: The patient does not participate in regular exercise at present  Goals      Patient Stated   . Increase physical activity (pt-stated)     Standing/chair exercises       Fall Risk Fall Risk  05/15/2019 05/13/2018 04/12/2018 06/04/2017 11/24/2013  Falls in the past year? 0 No No No No   Timed Get Up and Go performed: no, virtual visit  Depression Screen PHQ 2/9 Scores 05/15/2019 05/13/2018 04/12/2018 06/04/2017  PHQ - 2 Score 0 4 0 0  PHQ- 9 Score - 6 - -     Cognitive Function     6CIT Screen 05/15/2019 05/13/2018  What Year? 0 points 0 points  What month? 0 points 0 points  What time? 0 points 0 points  Count back from 20 0 points 0 points  Months in reverse 0 points 0 points  Repeat phrase 0 points 2 points  Total Score 0 2    Immunization History  Administered Date(s) Administered  . Influenza Whole 05/29/2007  . Influenza, High Dose Seasonal PF 06/01/2014  . Influenza,inj,Quad PF,6+ Mos 06/29/2015, 05/13/2018  . Influenza-Unspecified 05/28/2013  . Pneumococcal Conjugate-13 04/22/2014  . Pneumococcal Polysaccharide-23 06/12/2005, 09/13/2015  . Tdap  04/07/2019  . Zoster 10/08/2007  . Zoster Recombinat (Shingrix) 03/06/2019   Screening Tests Health Maintenance  Topic Date Due  . INFLUENZA VACCINE  11/26/2019 (Originally 03/29/2019)  . TETANUS/TDAP  04/06/2029  . DEXA SCAN  Completed  . PNA vac Low Risk Adult  Completed      Plan:    Keep all routine maintenance appointments.   Follow up 05/30/19 @930   Medicare Attestation I have personally reviewed: The patient's medical and social history Their use of alcohol, tobacco or illicit drugs Their current medications and supplements The patient's functional ability including ADLs,fall risks, home safety risks, cognitive, and hearing and visual impairment Diet and physical activities Evidence for depression    In addition, I have reviewed and discussed with patient certain preventive protocols, quality metrics, and best practice recommendations. A written personalized care plan for preventive services as well as general preventive health recommendations were provided to patient via mail     Varney Biles, LPN  [id number]

## 2019-05-15 NOTE — Patient Instructions (Addendum)
  Sharon Mcclure , Thank you for taking time to come for your Medicare Wellness Visit. I appreciate your ongoing commitment to your health goals. Please review the following plan we discussed and let me know if I can assist you in the future.   These are the goals we discussed: Goals      Patient Stated   . Increase physical activity (pt-stated)     Standing/chair exercises       This is a list of the screening recommended for you and due dates:  Health Maintenance  Topic Date Due  . Flu Shot  11/26/2019*  . Tetanus Vaccine  04/06/2029  . DEXA scan (bone density measurement)  Completed  . Pneumonia vaccines  Completed  *Topic was postponed. The date shown is not the original due date.

## 2019-05-28 ENCOUNTER — Ambulatory Visit (INDEPENDENT_AMBULATORY_CARE_PROVIDER_SITE_OTHER): Payer: Medicare Other | Admitting: Internal Medicine

## 2019-05-28 ENCOUNTER — Other Ambulatory Visit: Payer: Self-pay

## 2019-05-28 ENCOUNTER — Encounter: Payer: Self-pay | Admitting: Internal Medicine

## 2019-05-28 VITALS — BP 124/78 | HR 61 | Ht 62.0 in | Wt 181.0 lb

## 2019-05-28 DIAGNOSIS — E782 Mixed hyperlipidemia: Secondary | ICD-10-CM | POA: Diagnosis not present

## 2019-05-28 DIAGNOSIS — R9431 Abnormal electrocardiogram [ECG] [EKG]: Secondary | ICD-10-CM

## 2019-05-28 DIAGNOSIS — R0600 Dyspnea, unspecified: Secondary | ICD-10-CM

## 2019-05-28 DIAGNOSIS — R0609 Other forms of dyspnea: Secondary | ICD-10-CM

## 2019-05-28 DIAGNOSIS — I2510 Atherosclerotic heart disease of native coronary artery without angina pectoris: Secondary | ICD-10-CM | POA: Diagnosis not present

## 2019-05-28 DIAGNOSIS — I2584 Coronary atherosclerosis due to calcified coronary lesion: Secondary | ICD-10-CM | POA: Diagnosis not present

## 2019-05-28 NOTE — Progress Notes (Signed)
New Outpatient Visit Date: 05/28/2019  Referring Provider: McLean-Scocuzza, Nino Glow, MD [place],  [place] [address]  Chief Complaint: High cholesterol  HPI:  Sharon Mcclure is a 78 y.o. female who is being seen today for the evaluation of coronary artery disease at the request of Dr. Terese Door. She has a history of coronary artery disease (details unclear), hypertension, hyperlipidemia, hiatal hernia, fatty liver, and anxiety.  She was evaluated by Dr. Terese Door most recently in July, at which time no active cardiac symptoms were noted.  Today, Sharon Mcclure reports that she has been feeling well other than chronic exertional dyspnea when she is "in a hurry."  This began after she contracted pneumonia several years ago and has not changed much since.  She denies chest pain, palpitations, orthopnea, and edema.  She denies a history of coronary disease but states that her cholesterol has been high for many years.  She has been tried on multiple statins and has been intolerant of them (including every other day rosuvastatin 5 mg).  She is currently on fenofibrate 90 mg daily and seems to be tolerating this well.  Repatha has previously been discussed with her, but she has never taken this medication.  [name] notes occasional lightheadedness when she turns her head quickly.  She has not fallen or passed out.  --------------------------------------------------------------------------------------------------  Cardiovascular History & Procedures: Cardiovascular Problems:  Coronary artery calcification  Hyperlipidemia  Risk Factors:  Coronary artery calcification, hypertension, hyperlipidemia, obesity, and age greater than 44  Cath/PCI:  None  CV Surgery:  None  EP Procedures and Devices:  None  Non-Invasive Evaluation(s):  None  Recent CV Pertinent Labs: Lab Results  Component Value Date   CHOL 251 (H) 04/30/2019   HDL 50.50 04/30/2019   LDLCALC  162 (H) 04/30/2019   LDLCALC 203 (H) 05/13/2018   TRIG 194.0 (H) 04/30/2019   CHOLHDL 5 04/30/2019   K 4.4 04/30/2019   K 3.6 04/01/2012   MG 1.8 04/30/2019   BUN 15 04/30/2019   BUN 22 (H) 04/01/2012   CREATININE 0.91 04/30/2019   CREATININE 0.77 05/13/2018   --------------------------------------------------------------------------------------------------  Past Medical History:  Diagnosis Date  . Anxiety   . CAD (coronary artery disease)   . Fatty liver   . GERD (gastroesophageal reflux disease)   . Heart murmur   . Hemorrhoids   . Hiatal hernia   . History of skin cancer    Right arm  . HTN (hypertension)   . Hyperlipidemia   . Insomnia   . Insomnia   . Pneumonia October 2016  . Rosacea 11/24/2013  . Vitamin D deficiency     Past Surgical History:  Procedure Laterality Date  . APPENDECTOMY    . CATARACT EXTRACTION W/PHACO Left 01/25/2016   Procedure: CATARACT EXTRACTION PHACO AND INTRAOCULAR LENS PLACEMENT (IOC);  Surgeon: Birder Robson, MD;  Location: [facility] ORS;  Service: Ophthalmology;  Laterality: Left;  [place] 33.9 AP% 15.5 CDE 5.28 Fluid Pack Lot # [id number]  . CATARACT EXTRACTION W/PHACO Right 02/22/2016   Procedure: CATARACT EXTRACTION PHACO AND INTRAOCULAR LENS PLACEMENT (IOC);  Surgeon: Birder Robson, MD;  Location: [facility] ORS;  Service: Ophthalmology;  Laterality: Right;  [place] 00:40 AP% 23.6 CDE 9.45 fluid pack lot # [id number]  . TUBAL LIGATION  1981    Current Meds  Medication Sig  . amLODipine (NORVASC) 5 MG tablet Take 1 tablet (5 mg total) by mouth daily.  Marland Kitchen aspirin 81 MG tablet Take 81 mg by mouth daily.    Marland Kitchen  Biotin 10000 MCG TABS Take 1 tablet by mouth daily.  . Calcium Carb-Cholecalciferol (CALCIUM+D3 PO) Take 1 tablet by mouth daily.  . citalopram (CELEXA) 40 MG tablet TAKE 1 TABLET BY MOUTH ONCE DAILY  . cyanocobalamin 1000 MCG tablet Take 1,000 mcg by mouth daily.  . FENOFIBRATE PO Take 0.5 mg by mouth daily.   . metroNIDAZOLE (METROCREAM)  0.75 % cream Apply topically 2 (two) times daily.  . pantoprazole (PROTONIX) 40 MG tablet Take 1 tablet (40 mg total) by mouth daily. 30 minute before food  . traZODone (DESYREL) 50 MG tablet Take 0.5-1 tablets (25-50 mg total) by mouth at bedtime as needed for sleep.  Marland Kitchen triamcinolone ointment (KENALOG) 0.1 % Apply topically.  Marland Kitchen VITAMIN D, ERGOCALCIFEROL, PO Take 5,000 Units by mouth daily.    Allergies: Ampicillin, Penicillins, Crestor [rosuvastatin calcium], Ezetimibe-simvastatin, Niaspan [niacin er], Vytorin [ezetimibe-simvastatin], Zetia [ezetimibe], Zocor [simvastatin], and Welchol [colesevelam hcl]  Social History   Tobacco Use  . Smoking status: Former Smoker    Packs/day: 0.25    Years: 22.00    Pack years: 5.50    Types: Cigarettes    Quit date: 1980    Years since quitting: 40.7  . Smokeless tobacco: Never Used  Substance Use Topics  . Alcohol use: No  . Drug use: No    Family History  Problem Relation Age of Onset  . Heart disease Mother 1       aortic valve- had AVR- died 6 wks after  . Hyperlipidemia Mother   . Aneurysm Father 53       aortic aneursym  . Heart disease Father   . Heart disease Brother 57       CABG  . Colon cancer Neg Hx   . Stomach cancer Neg Hx     Review of Systems: A 12-system review of systems was performed and was negative except as noted in the HPI.  --------------------------------------------------------------------------------------------------  Physical Exam: BP 124/78 (BP Location: Left Arm, Patient Position: Sitting, Cuff Size: Normal)   Pulse 61   Ht 5\' 2"  (1.575 m)   Wt 181 lb (82.1 kg)   SpO2 98%   BMI 33.11 kg/m   General: NAD.  Accompanied by her daughter. HEENT: No conjunctival pallor or scleral icterus.  Facemask in place. Neck: Supple without lymphadenopathy, thyromegaly, JVD, or HJR. No carotid bruit. Lungs: Normal work of breathing. Clear to auscultation bilaterally without wheezes or crackles. Heart:  Regular rate and rhythm without murmurs, rubs, or gallops. Non-displaced PMI. Abd: Bowel sounds present. Soft, NT/ND without hepatosplenomegaly Ext: No lower extremity edema. Radial, PT, and DP pulses are 2+ bilaterally Skin: Warm and dry without rash. Neuro: CNIII-XII intact. Strength and fine-touch sensation intact in upper and lower extremities bilaterally. Psych: Normal mood and affect.  EKG: Sinus bradycardia with occasional PVCs, low voltage, nonspecific ST changes.  Lab Results  Component Value Date   WBC 7.3 04/30/2019   HGB 14.3 04/30/2019   HCT 42.8 04/30/2019   MCV 86.0 04/30/2019   PLT 294.0 04/30/2019    Lab Results  Component Value Date   NA 140 04/30/2019   K 4.4 04/30/2019   CL 101 04/30/2019   CO2 30 04/30/2019   BUN 15 04/30/2019   CREATININE 0.91 04/30/2019   GLUCOSE 88 04/30/2019   ALT 17 04/30/2019    Lab Results  Component Value Date   CHOL 251 (H) 04/30/2019   HDL 50.50 04/30/2019   LDLCALC 162 (H) 04/30/2019   TRIG 194.0 (H)  04/30/2019   CHOLHDL 5 04/30/2019     --------------------------------------------------------------------------------------------------  ASSESSMENT AND PLAN: Dyspnea on exertion, coronary artery calcification, and abnormal EKG: Ms. Reising reports a long history of exertional dyspnea that began after she had pneumonia.  It seems to be relatively stable.  She otherwise does not have angina but was noted to have coronary artery calcification on CT of the chest in 2016 (images personally reviewed).  Her EKG today shows PVCs and nonspecific T wave changes.  Given these findings and marked hyperlipidemia that has been suboptimally controlled, she is certainly at risk for significant ASCVD.  I recommend a pharmacologic myocardial perfusion stress test to assess for hemodynamically significant CAD.  Hyperlipidemia: Ms. Preast has significant mixed hyperlipidemia including LDL that has been as high as 191.  This is borderline for  the diagnosis of familial hyperlipidemia.  Nonetheless, given evidence of coronary artery calcification, I recommend aggressive lipid treatment.  I do not think that fenofibrate alone will achieve adequate LDL control.  As she has been intolerant of numerous statins, I will refer her to our lipid clinic to initiate PCSK9 inhibitor therapy.  She can continue fenofibrate for now.  Hypertension: Blood pressure normal today.  No medication changes.  Follow-up: Return to clinic in 3 months.  Nelva Bush, MD 05/29/2019 9:03 PM

## 2019-05-28 NOTE — Patient Instructions (Addendum)
Medication Instructions:  Your physician recommends that you continue on your current medications as directed. Please refer to the Current Medication list given to you today.  If you need a refill on your cardiac medications before your next appointment, please call your pharmacy.   Lab work: NONE If you have labs (blood work) drawn today and your tests are completely normal, you will receive your results only by: Marland Kitchen MyChart Message (if you have MyChart) OR . A paper copy in the mail If you have any lab test that is abnormal or we need to change your treatment, we will call you to review the results.  Testing/Procedures: Your physician has requested that you have a lexiscan myoview. For further information please visit [email or web address]. Please follow instruction sheet, as given.  [place]  Your caregiver has ordered a Stress Test with nuclear imaging. The purpose of this test is to evaluate the blood supply to your heart muscle. This procedure is referred to as a "Non-Invasive Stress Test." This is because other than having an IV started in your vein, nothing is inserted or "invades" your body. Cardiac stress tests are done to find areas of poor blood flow to the heart by determining the extent of coronary artery disease (CAD). Some patients exercise on a treadmill, which naturally increases the blood flow to your heart, while others who are  unable to walk on a treadmill due to physical limitations have a pharmacologic/chemical stress agent called Lexiscan . This medicine will mimic walking on a treadmill by temporarily increasing your coronary blood flow.   Please note: these test may take anywhere between 2-4 hours to complete  PLEASE REPORT TO [place] AT THE FIRST DESK WILL DIRECT YOU WHERE TO GO  Date of Procedure:_____________________________________  Arrival Time for Procedure:______________________________   PLEASE NOTIFY THE OFFICE AT  LEAST 24 HOURS IN ADVANCE IF YOU ARE UNABLE TO KEEP YOUR APPOINTMENT.  [phone] AND  PLEASE NOTIFY NUCLEAR MEDICINE AT [facility] AT LEAST 24 HOURS IN ADVANCE IF YOU ARE UNABLE TO KEEP YOUR APPOINTMENT. [phone]  How to prepare for your Myoview test:  1. Do not eat or drink after midnight 2. No caffeine for 24 hours prior to test 3. No smoking 24 hours prior to test. 4. Your medication may be taken with water.  If your doctor stopped a medication because of this test, do not take that medication. 5. Ladies, please do not wear dresses.  Skirts or pants are appropriate. Please wear a short sleeve shirt. 6. No perfume, cologne or lotion. 7. Wear comfortable walking shoes. No heels  Follow-Up: You have been referred to [place] with [facility] in [place].    At [facility], you and your health needs are our priority.  As part of our continuing mission to provide you with exceptional heart care, we have created designated Provider Care Teams.  These Care Teams include your primary Cardiologist (physician) and Advanced Practice Providers (APPs -  Physician Assistants and Nurse Practitioners) who all work together to provide you with the care you need, when you need it. You will need a follow up appointment in 3 months.   You may see DR Harrell Gave END or one of the following Advanced Practice Providers on your designated Care Team:   Murray Hodgkins, NP Christell Faith, PA-C . Marrianne Mood, PA-C    Cardiac Nuclear Scan A cardiac nuclear scan is a test that measures blood flow to the heart when a person  is resting and when he or she is exercising. The test looks for problems such as:  Not enough blood reaching a portion of the heart.  The heart muscle not working normally. You may need this test if:  You have heart disease.  You have had abnormal lab results.  You have had heart surgery or a balloon procedure to open up blocked arteries (angioplasty).  You have  chest pain.  You have shortness of breath. In this test, a radioactive dye (tracer) is injected into your bloodstream. After the tracer has traveled to your heart, an imaging device is used to measure how much of the tracer is absorbed by or distributed to various areas of your heart. This procedure is usually done at a hospital and takes 2-4 hours. Tell a health care provider about:  Any allergies you have.  All medicines you are taking, including vitamins, herbs, eye drops, creams, and over-the-counter medicines.  Any problems you or family members have had with anesthetic medicines.  Any blood disorders you have.  Any surgeries you have had.  Any medical conditions you have.  Whether you are pregnant or may be pregnant. What are the risks? Generally, this is a safe procedure. However, problems may occur, including:  Serious chest pain and heart attack. This is only a risk if the stress portion of the test is done.  Rapid heartbeat.  Sensation of warmth in your chest. This usually passes quickly.  Allergic reaction to the tracer. What happens before the procedure?  Ask your health care provider about changing or stopping your regular medicines. This is especially important if you are taking diabetes medicines or blood thinners.  Follow instructions from your health care provider about eating or drinking restrictions.  Remove your jewelry on the day of the procedure. What happens during the procedure?  An IV will be inserted into one of your veins.  Your health care provider will inject a small amount of radioactive tracer through the IV.  You will wait for 20-40 minutes while the tracer travels through your bloodstream.  Your heart activity will be monitored with an electrocardiogram (ECG).  You will lie down on an exam table.  Images of your heart will be taken for about 15-20 minutes.  You may also have a stress test. For this test, one of the following may be  done: ? You will exercise on a treadmill or stationary bike. While you exercise, your heart's activity will be monitored with an ECG, and your blood pressure will be checked. ? You will be given medicines that will increase blood flow to parts of your heart. This is done if you are unable to exercise.  When blood flow to your heart has peaked, a tracer will again be injected through the IV.  After 20-40 minutes, you will get back on the exam table and have more images taken of your heart.  Depending on the type of tracer used, scans may need to be repeated 3-4 hours later.  Your IV line will be removed when the procedure is over. The procedure may vary among health care providers and hospitals. What happens after the procedure?  Unless your health care provider tells you otherwise, you may return to your normal schedule, including diet, activities, and medicines.  Unless your health care provider tells you otherwise, you may increase your fluid intake. This will help to flush the contrast dye from your body. Drink enough fluid to keep your urine pale yellow.  Ask  your health care provider, or the department that is doing the test: ? When will my results be ready? ? How will I get my results? Summary  A cardiac nuclear scan measures the blood flow to the heart when a person is resting and when he or she is exercising.  Tell your health care provider if you are pregnant.  Before the procedure, ask your health care provider about changing or stopping your regular medicines. This is especially important if you are taking diabetes medicines or blood thinners.  After the procedure, unless your health care provider tells you otherwise, increase your fluid intake. This will help flush the contrast dye from your body.  After the procedure, unless your health care provider tells you otherwise, you may return to your normal schedule, including diet, activities, and medicines. This information is  not intended to replace advice given to you by your health care provider. Make sure you discuss any questions you have with your health care provider. Document Released: 09/08/2004 Document Revised: 01/28/2018 Document Reviewed: 01/28/2018 Elsevier Patient Education  2020 [employer].

## 2019-05-29 DIAGNOSIS — I2510 Atherosclerotic heart disease of native coronary artery without angina pectoris: Secondary | ICD-10-CM | POA: Insufficient documentation

## 2019-05-29 DIAGNOSIS — R9431 Abnormal electrocardiogram [ECG] [EKG]: Secondary | ICD-10-CM | POA: Insufficient documentation

## 2019-05-29 DIAGNOSIS — R0600 Dyspnea, unspecified: Secondary | ICD-10-CM | POA: Insufficient documentation

## 2019-05-29 DIAGNOSIS — R0609 Other forms of dyspnea: Secondary | ICD-10-CM | POA: Insufficient documentation

## 2019-05-29 DIAGNOSIS — I2584 Coronary atherosclerosis due to calcified coronary lesion: Secondary | ICD-10-CM | POA: Insufficient documentation

## 2019-05-29 HISTORY — DX: Atherosclerotic heart disease of native coronary artery without angina pectoris: I25.10

## 2019-05-29 HISTORY — DX: Abnormal electrocardiogram (ECG) (EKG): R94.31

## 2019-05-30 ENCOUNTER — Other Ambulatory Visit: Payer: Self-pay

## 2019-05-30 ENCOUNTER — Encounter: Payer: Self-pay | Admitting: Internal Medicine

## 2019-05-30 ENCOUNTER — Ambulatory Visit (INDEPENDENT_AMBULATORY_CARE_PROVIDER_SITE_OTHER): Payer: Medicare Other | Admitting: Internal Medicine

## 2019-05-30 VITALS — BP 124/78 | Ht 62.0 in | Wt 181.0 lb

## 2019-05-30 DIAGNOSIS — L719 Rosacea, unspecified: Secondary | ICD-10-CM | POA: Diagnosis not present

## 2019-05-30 DIAGNOSIS — I2510 Atherosclerotic heart disease of native coronary artery without angina pectoris: Secondary | ICD-10-CM

## 2019-05-30 DIAGNOSIS — I2584 Coronary atherosclerosis due to calcified coronary lesion: Secondary | ICD-10-CM

## 2019-05-30 DIAGNOSIS — E782 Mixed hyperlipidemia: Secondary | ICD-10-CM | POA: Diagnosis not present

## 2019-05-30 DIAGNOSIS — L280 Lichen simplex chronicus: Secondary | ICD-10-CM | POA: Diagnosis not present

## 2019-05-30 DIAGNOSIS — I10 Essential (primary) hypertension: Secondary | ICD-10-CM | POA: Diagnosis not present

## 2019-05-30 MED ORDER — METRONIDAZOLE 0.75 % EX CREA: TOPICAL_CREAM | Freq: Two times a day (BID) | CUTANEOUS | 2 refills | Status: DC

## 2019-05-30 NOTE — Progress Notes (Signed)
Telephone Note  I connected with Sharon Mcclure  on 05/30/19 at  9:30 AM EDT by phone and verified that I am speaking with the correct person using two identifiers.  Location patient: home Location provider:work or home office Persons participating in the virtual visit: patient, provider  I discussed the limitations of evaluation and management by telemedicine and the availability of in person appointments. The patient expressed understanding and agreed to proceed.   HPI: 1. Rosacea not picked up metrocream  04/08/19 bxs LSC and prurigo  2. HLD/HTN/CAD FH heart disease pending stress test and trying to get repatha approved on norvasc 5 mg qd bp controlled     ROS: See pertinent positives and negatives per HPI.  Past Medical History:  Diagnosis Date  . Anxiety   . CAD (coronary artery disease)   . Fatty liver   . GERD (gastroesophageal reflux disease)   . Heart murmur   . Hemorrhoids   . Hiatal hernia   . History of skin cancer    Right arm  . HTN (hypertension)   . Hyperlipidemia   . Insomnia   . Insomnia   . Pneumonia October 2016  . Rosacea 11/24/2013  . Vitamin D deficiency     Past Surgical History:  Procedure Laterality Date  . APPENDECTOMY    . CATARACT EXTRACTION W/PHACO Left 01/25/2016   Procedure: CATARACT EXTRACTION PHACO AND INTRAOCULAR LENS PLACEMENT (IOC);  Surgeon: Birder Robson, MD;  Location: [facility] ORS;  Service: Ophthalmology;  Laterality: Left;  [place] 33.9 AP% 15.5 CDE 5.28 Fluid Pack Lot # [id number]  . CATARACT EXTRACTION W/PHACO Right 02/22/2016   Procedure: CATARACT EXTRACTION PHACO AND INTRAOCULAR LENS PLACEMENT (IOC);  Surgeon: Birder Robson, MD;  Location: [facility] ORS;  Service: Ophthalmology;  Laterality: Right;  [place] 00:40 AP% 23.6 CDE 9.45 fluid pack lot # [id number]  . TUBAL LIGATION  1981    Family History  Problem Relation Age of Onset  . Heart disease Mother 46       aortic valve- had AVR- died 6 wks after  . Hyperlipidemia Mother   .  Aneurysm Father 50       aortic aneursym  . Heart disease Father   . Heart disease Brother 66       CABG  . Colon cancer Neg Hx   . 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  amLODipine (NORVASC) 5 MG tablet, Take 1 tablet (5 mg total) by mouth daily., Disp: 90 tablet, Rfl: 3 .  aspirin 81 MG tablet, Take 81 mg by mouth daily.  , Disp: , Rfl:  .  Biotin 10000 MCG TABS, Take 1 tablet by mouth daily., Disp: , Rfl:  .  Calcium Carb-Cholecalciferol (CALCIUM+D3 PO), Take 1 tablet by mouth daily., Disp: , Rfl:  .  citalopram (CELEXA) 40 MG tablet, TAKE 1 TABLET BY MOUTH ONCE DAILY, Disp: 90 tablet, Rfl: 3 .  cyanocobalamin 1000 MCG tablet, Take 1,000 mcg by mouth daily., Disp: , Rfl:  .  FENOFIBRATE PO, Take 0.5 mg by mouth daily. , Disp: , Rfl:  .  metroNIDAZOLE (METROCREAM) 0.75 % cream, Apply topically 2 (two) times daily., Disp: 45 g, Rfl: 2 .  pantoprazole (PROTONIX) 40 MG tablet, Take 1 tablet (40 mg total) by mouth daily. 30 minute before food, Disp: 90 tablet, Rfl: 3 .  traZODone (DESYREL) 50 MG  tablet, Take 0.5-1 tablets (25-50 mg total) by mouth at bedtime as needed for sleep., Disp: 30 tablet, Rfl: 2 .  triamcinolone ointment (KENALOG) 0.1 %, Apply topically., Disp: , Rfl:  .  VITAMIN D, ERGOCALCIFEROL, PO, Take 5,000 Units by mouth daily., Disp: , Rfl:   EXAM:  VITALS per patient if applicable:  GENERAL: alert, oriented, appears well and in no acute distress  PSYCH/NEURO: pleasant and cooperative, no obvious depression or anxiety, speech and thought processing grossly intact  ASSESSMENT AND PLAN:  Discussed the following assessment and plan:  Essential hypertension Mixed hyperlipidemia CAD FH heart disease  -pending stress test and repatha approval cards f/u cards Dr. Saunders Revel 08/27/19   Rosacea - Plan: metroNIDAZOLE (METROCREAM) [id number] % cream LSC (lichen simplex  chronicus)  HM Flu shot utd upcoming 06/02/19 prevnar utd  pna had in 2017  2/2shingrix Tdap 04/07/19 Labs had 04/2019 see  Mammogram neg 06/25/19 Out of age window pap 04/22/14 negative  DEXA 06/10/18 osteopenia  Colonoscopy In 2007 hemorrhoids had FIT test negative 07/06/17 Skin Dr. Caryl Asp h/o skin cancer  -[facility] derm saw 04/08/19 [place] and prurigo nodule on face  -we discussed possible serious and likely etiologies, options for evaluation and workup, limitations of telemedicine visit vs in person visit, treatment, treatment risks and precautions. Pt prefers to treat via telemedicine empirically rather then risking or undertaking an in person visit at this moment. Patient agrees to seek prompt in person care if worsening, new symptoms arise, or if is not improving with treatment.   I discussed the assessment and treatment plan with the patient. The patient was provided an opportunity to ask questions and all were answered. The patient agreed with the plan and demonstrated an understanding of the instructions.   The patient was advised to call back or seek an in-person evaluation if the symptoms worsen or if the condition fails to improve as anticipated.  Time spent 15 minute Delorise Jackson, MD

## 2019-06-02 ENCOUNTER — Other Ambulatory Visit: Payer: Self-pay

## 2019-06-02 ENCOUNTER — Ambulatory Visit (INDEPENDENT_AMBULATORY_CARE_PROVIDER_SITE_OTHER): Payer: Medicare Other

## 2019-06-02 ENCOUNTER — Telehealth: Payer: Self-pay | Admitting: Internal Medicine

## 2019-06-02 DIAGNOSIS — Z23 Encounter for immunization: Secondary | ICD-10-CM

## 2019-06-02 NOTE — Telephone Encounter (Signed)
Pt states that the ointment of metroNIDAZOLE (METROCREAM) 0.75 % cream that was too expensive. Please advise? Thank you!

## 2019-06-03 NOTE — Telephone Encounter (Signed)
Patient was informed.  Patient understood and no questions, comments, or concerns at this time.  

## 2019-06-03 NOTE — Telephone Encounter (Signed)
There is no other option for rosacea that Ive found works  She can check with dermatology   [place]

## 2019-06-18 ENCOUNTER — Other Ambulatory Visit: Payer: Self-pay

## 2019-06-18 ENCOUNTER — Ambulatory Visit (INDEPENDENT_AMBULATORY_CARE_PROVIDER_SITE_OTHER): Payer: Medicare Other | Admitting: Pharmacist

## 2019-06-18 DIAGNOSIS — I2510 Atherosclerotic heart disease of native coronary artery without angina pectoris: Secondary | ICD-10-CM | POA: Diagnosis not present

## 2019-06-18 DIAGNOSIS — E782 Mixed hyperlipidemia: Secondary | ICD-10-CM | POA: Diagnosis not present

## 2019-06-18 DIAGNOSIS — G720 Drug-induced myopathy: Secondary | ICD-10-CM | POA: Diagnosis not present

## 2019-06-18 DIAGNOSIS — T466X5A Adverse effect of antihyperlipidemic and antiarteriosclerotic drugs, initial encounter: Secondary | ICD-10-CM

## 2019-06-18 MED ORDER — REPATHA SURECLICK 140 MG/ML ~~LOC~~ SOAJ: 1.0000 "pen " | SUBCUTANEOUS | 11 refills | Status: DC

## 2019-06-18 MED ORDER — VASCEPA 1 G PO CAPS: 2.0000 | ORAL_CAPSULE | Freq: Two times a day (BID) | ORAL | 11 refills | Status: DC

## 2019-06-18 NOTE — Progress Notes (Addendum)
Patient ID: JEFFRIE ARONICA                 DOB: April 10, 1941                    MRN: [id number]     HPI: Sharon Mcclure is a 78 y.o. female patient referred to lipid clinic by Dr End. PMH is significant for CAD with coronary artery calcifications, HTN, HLD, hiatal hernia, fatty liver, and anxiety. She has a history of statin intolerance and was referred to lipid clinic to discuss PCSK9i therapy.  Pt presents today in good spirits. She reports experiencing muscle and joint pain on multiple statins in the past - states her MDs have been trying her on different statins for the past 30 years. She currently only takes fenofibrate and started cutting her 160mg  tablets in half due to joint pain in her knees, hips, and back. Symptoms have not improved on lower dose.  Current Medications: fenofibrate 80mg  daily Intolerances: rosuvastatin 5mg  every other day, Lipitor, pravastatin, fenofibrate 160mg  and 80mg  (cut tablets in half), Zetia - joint pain in knees, hips, and back Risk Factors: baseline LDL > 190, family hx of premature CAD, coronary calcifications LDL goal: 70mg /dL  Diet: Breakfast - Cheerios and blueberries. Lunch - [place] sandwich. Dinner - meat, carb, veggie. Eats most meals at home. Occasionally snacks on oatmeal cookies or [place].  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 Denies illicit drug use and alcohol use.  Labs: 04/30/19: TC 251, TG 194, HDL 50.5, LDL 162 (fenofibrate 80mg  daily)  Past Medical History:  Diagnosis Date  . Anxiety   . CAD (coronary artery disease)   . Fatty liver   . GERD (gastroesophageal reflux disease)   . Heart murmur   . Hemorrhoids   . Hiatal hernia   . History of skin cancer    Right  arm  . HTN (hypertension)   . Hyperlipidemia   . Insomnia   . Insomnia   . Pneumonia October 2016  . Rosacea 11/24/2013  . Vitamin D deficiency     Current Outpatient Medications on File Prior to Visit  Medication Sig Dispense Refill  . amLODipine (NORVASC) 5 MG tablet Take 1 tablet (5 mg total) by mouth daily. 90 tablet 3  . aspirin 81 MG tablet Take 81 mg by mouth daily.      . Biotin 10000 MCG TABS Take 1 tablet by mouth daily.    . Calcium Carb-Cholecalciferol (CALCIUM+D3 PO) Take 1 tablet by mouth daily.    . citalopram (CELEXA) 40 MG tablet TAKE 1 TABLET BY MOUTH ONCE DAILY 90 tablet 3  . cyanocobalamin 1000 MCG tablet Take 1,000 mcg by mouth daily.    . FENOFIBRATE PO Take 0.5 mg by mouth daily.     . metroNIDAZOLE (METROCREAM) 0.75 % cream Apply topically 2 (two) times daily. 45 g 2  . pantoprazole (PROTONIX) 40 MG tablet Take 1 tablet (40 mg total) by mouth daily. 30 minute before food 90 tablet 3  . traZODone (DESYREL) 50 MG tablet Take 0.5-1 tablets (25-50 mg total) by mouth at bedtime as needed for sleep. 30 tablet 2  . triamcinolone ointment (KENALOG) 0.1 % Apply topically.    Marland Kitchen  VITAMIN D, ERGOCALCIFEROL, PO Take 5,000 Units by mouth daily.     No current facility-administered medications on file prior to visit.     Allergies  Allergen Reactions  . Ampicillin Hives, Shortness Of Breath and Swelling    Has patient had a PCN reaction causing immediate rash, facial/tongue/throat swelling, SOB or lightheadedness with hypotension: Yes Has patient had a PCN reaction causing severe rash involving mucus membranes or skin necrosis: No Has patient had a PCN reaction that required hospitalization No Has patient had a PCN reaction occurring within the last 10 years: No If all of the above answers are "NO", then may proceed with Cephalosporin use.   Marland Kitchen Penicillins Hives and Shortness Of Breath    Has patient had a PCN reaction causing immediate rash, facial/tongue/throat swelling,  SOB or lightheadedness with hypotension: Yes Has patient had a PCN reaction causing severe rash involving mucus membranes or skin necrosis: No Has patient had a PCN reaction that required hospitalization No Has patient had a PCN reaction occurring within the last 10 years: No If all of the above answers are "NO", then may proceed with Cephalosporin use.   Marland Kitchen Crestor [Rosuvastatin Calcium]     Burning sensation  . Ezetimibe-Simvastatin   . Niaspan [Niacin Er]     Fatigues, aches.  . Vytorin [Ezetimibe-Simvastatin]   . Zetia [Ezetimibe]   . Zocor [Simvastatin]   . Welchol [Colesevelam Hcl] Other (See Comments)    Severe bloating even on low dose    Assessment/Plan:  1. Hyperlipidemia - LDL above goal < 70 due to hx of ASCVD, TG also above goal. Pt is intolerant to 3 statins and is experiencing joint pain on her fenofibrate. Will stop fenofibrate, start Repatha 140mg  Q2W, and start Vascepa 2g BID. Pt approved for $2500 in copay assistance through [employer]. She was advised to call clinic if she has trouble tolerating either medication, otherwise will have follow up labs drawn at visit with Dr End in December or at her PCP office.  Caydance Kuehnle E. January Bergthold, PharmD, BCACP, [place] [id number], [place], [place] [address] Phone: [phone]; Fax: [phone] 06/18/2019 1:48 PM

## 2019-06-18 NOTE — Patient Instructions (Addendum)
Your LDL (bad cholesterol) is currently 162 and your goal is less than 70  Start Repatha shots once every 2 weeks into the fatty tissue of your stomach or upper thigh. Keep the medication in the fridge until you are ready to give your dose, then take it out and let warm up to room temperature for 30-60 mins. This will lower your LDL cholesterol by 60% and helps to prevent heart attacks and strokes.  Your triglycerides are elevated at 194, your goal is less than 150.  Start Vascepa (prescription fish oil) 2 capsules twice a day. This will help to lower your triglycerides by 30% and helps to prevent heart attacks and strokes.  Discontinue fenofibrate since this is causing muscle aches.  You have been approved for a grant through the [employer] to help bring the cost of your medication to $0. You do not need to do anything with the paperwork they send you in the mail unless you would like to keep it for your records.  Keep up the good work with diet and exercise. Aim for a diet full of vegetables, fruit and lean meats (chicken, [place], fish). Try to limit carbs (bread, pasta, sugar, rice) and red meat consumption.  Plan to recheck fasting cholesterol in 2-3 months either with Dr End or at your primary care doctor  Please give [place] a call at [phone] with any questions or concerns.

## 2019-06-20 ENCOUNTER — Other Ambulatory Visit: Payer: Self-pay | Admitting: Internal Medicine

## 2019-06-20 DIAGNOSIS — G4700 Insomnia, unspecified: Secondary | ICD-10-CM

## 2019-06-20 MED ORDER — TRAZODONE HCL 50 MG PO TABS: 25.0000 mg | ORAL_TABLET | Freq: Every evening | ORAL | 11 refills | Status: DC | PRN

## 2019-06-25 ENCOUNTER — Other Ambulatory Visit: Payer: Self-pay

## 2019-06-25 ENCOUNTER — Encounter
Admission: RE | Admit: 2019-06-25 | Discharge: 2019-06-25 | Disposition: A | Discharge status: Home / Self Care | Payer: Medicare Other | Source: Ambulatory Visit | Attending: Internal Medicine | Admitting: Internal Medicine

## 2019-06-25 ENCOUNTER — Ambulatory Visit: Payer: Medicare Other

## 2019-06-25 DIAGNOSIS — I2584 Coronary atherosclerosis due to calcified coronary lesion: Secondary | ICD-10-CM | POA: Insufficient documentation

## 2019-06-25 DIAGNOSIS — I2510 Atherosclerotic heart disease of native coronary artery without angina pectoris: Secondary | ICD-10-CM | POA: Insufficient documentation

## 2019-06-25 DIAGNOSIS — R0600 Dyspnea, unspecified: Secondary | ICD-10-CM | POA: Diagnosis not present

## 2019-06-25 DIAGNOSIS — R0609 Other forms of dyspnea: Secondary | ICD-10-CM

## 2019-06-25 LAB — NM MYOCAR MULTI W/SPECT W/WALL MOTION / EF
Estimated workload: 1 METS
Exercise duration (min): 0 min
Exercise duration (sec): 0 s
LV dias vol: 93 mL (ref 46–106)
LV sys vol: 40 mL
MPHR: 144 {beats}/min
Peak HR: 77 {beats}/min
Percent HR: 53 %
Rest HR: 57 {beats}/min
SDS: 2
SRS: 5
SSS: 4
TID: 1.11

## 2019-06-25 MED ORDER — TECHNETIUM TC 99M TETROFOSMIN IV KIT
10.2600 | PACK | Freq: Once | INTRAVENOUS | Status: AC | PRN
  Administered 2019-06-25: 10.26 via INTRAVENOUS

## 2019-06-25 MED ORDER — REGADENOSON 0.4 MG/5ML IV SOLN
0.4000 mg | Freq: Once | INTRAVENOUS | Status: AC
  Administered 2019-06-25: 0.4 mg via INTRAVENOUS

## 2019-06-25 MED ORDER — TECHNETIUM TC 99M TETROFOSMIN IV KIT
30.0000 | PACK | Freq: Once | INTRAVENOUS | Status: AC | PRN
  Administered 2019-06-25: 30.664 via INTRAVENOUS

## 2019-06-27 ENCOUNTER — Telehealth: Payer: Self-pay | Admitting: *Deleted

## 2019-06-27 DIAGNOSIS — R0600 Dyspnea, unspecified: Secondary | ICD-10-CM

## 2019-06-27 DIAGNOSIS — R0609 Other forms of dyspnea: Secondary | ICD-10-CM

## 2019-06-27 NOTE — Telephone Encounter (Signed)
Results called to pt. Pt verbalized understanding. Echo order entered. Routing to scheduling to reach out to schedule echo.

## 2019-06-27 NOTE — Telephone Encounter (Signed)
-----   Message from Nelva Bush, MD sent at 06/26/2019  6:44 AM EDT ----- Please let Sharon Mcclure know that her stress test does not show evidence of a significant narrowing/blockage in a heart artery.  Given history of dyspnea on exertion, I recommend that we obtain a transthoracic echocardiogram at her convenience and follow-up in the office shortly after the echo has been performed.

## 2019-06-27 NOTE — Telephone Encounter (Signed)
No ans .  Busy x 2

## 2019-07-10 NOTE — Telephone Encounter (Signed)
No ans no vm   °

## 2019-07-10 NOTE — Telephone Encounter (Signed)
Following up on scheduling echo for patient. Thank so much!

## 2019-07-16 NOTE — Telephone Encounter (Signed)
Attempted to schedule echo - no ans no vm

## 2019-08-05 ENCOUNTER — Ambulatory Visit: Payer: Medicare Other | Admitting: Physician Assistant

## 2019-08-06 ENCOUNTER — Ambulatory Visit: Payer: Medicare Other | Admitting: Internal Medicine

## 2019-09-03 ENCOUNTER — Ambulatory Visit (INDEPENDENT_AMBULATORY_CARE_PROVIDER_SITE_OTHER): Payer: Medicare Other

## 2019-09-03 ENCOUNTER — Other Ambulatory Visit: Payer: Self-pay

## 2019-09-03 DIAGNOSIS — R0600 Dyspnea, unspecified: Secondary | ICD-10-CM | POA: Diagnosis not present

## 2019-09-03 DIAGNOSIS — R0609 Other forms of dyspnea: Secondary | ICD-10-CM

## 2019-09-09 ENCOUNTER — Other Ambulatory Visit: Payer: Medicare Other

## 2019-09-15 NOTE — Progress Notes (Signed)
Follow-up Outpatient Visit Date: 09/17/2019  Primary Care Provider: McLean-Scocuzza, Nino Glow, MD [place] [address]  Chief Complaint: Shortness of breath  HPI:  Sharon Mcclure is a 79 y.o. female with history of coronary artery disease (details unclear), hypertension, hyperlipidemia, hiatal hernia, fatty liver, and anxiety, who presents for follow-up of coronary artery disease and hyperlipidemia.  I met Sharon Mcclure in 04/2019, at which time she was feeling well other than chronic exertional dyspnea the began after pneumonia several years ago.  Subsequent myocardial perfusion stress test showed no evidence of ischemia or infarct, though LVEF was calculated as <30% (though it appeared normal by visual estimation).  Subsequent echo confirmed normal LVEF.  Today, Sharon Mcclure reports that her breathing is a little bit better than when we last spoke.  She still notes some exertional dyspnea.  She has not had any chest pain.  She reports intermittent dizziness, which has led to a few falls.  Most recently, she tripped over a board on the floor and landed on her face and chest.  She is just recovering from the soreness but is curious as to why she did not have any significant bruising after such a significant fall.  She has not had palpitations or syncope.  She notes mild intermittent edema in her ankles.  Her weight has been stable.  She is tolerating Repatha and Vascepa well.  --------------------------------------------------------------------------------------------------  Cardiovascular History & Procedures: Cardiovascular Problems:  Coronary artery calcification  Hyperlipidemia  Chronic dyspnea on exertion  Risk Factors:  Coronary artery calcification, hypertension, hyperlipidemia, obesity, and age greater than 2  Cath/PCI:  None  CV Surgery:  None  EP Procedures and Devices:  None  Non-Invasive Evaluation(s):  TTE (09/03/2019): Normal LV size and wall  thickness.  LVEF 60-65% with grade 1 diastolic dysfunction and normal wall motion.  Normal RV size and function.  Mild to moderate left atrial enlargement.  Aortic sclerosis without stenosis.  Mild pulmonary hypertension.  Pharmacologic MPI (06/25/2019): Intermediate risk study with calculated LVEF <30% (normal LV function by visual estimation).  No evidence of significant ischemia or scar.  Recent CV Pertinent Labs: Lab Results  Component Value Date   CHOL 251 (H) 04/30/2019   HDL 50.50 04/30/2019   LDLCALC 162 (H) 04/30/2019   LDLCALC 203 (H) 05/13/2018   TRIG 194.0 (H) 04/30/2019   CHOLHDL 5 04/30/2019   K 4.4 04/30/2019   K 3.6 04/01/2012   MG 1.8 04/30/2019   BUN 15 04/30/2019   BUN 22 (H) 04/01/2012   CREATININE 0.91 04/30/2019   CREATININE 0.77 05/13/2018    Past medical and surgical history were reviewed and updated in EPIC.  Current Meds  Medication Sig  . amLODipine (NORVASC) 5 MG tablet Take 1 tablet (5 mg total) by mouth daily.  Marland Kitchen aspirin 81 MG tablet Take 81 mg by mouth daily.    . Biotin 10000 MCG TABS Take 1 tablet by mouth daily.  . Calcium Carb-Cholecalciferol (CALCIUM+D3 PO) Take 1 tablet by mouth daily.  . citalopram (CELEXA) 40 MG tablet TAKE 1 TABLET BY MOUTH ONCE DAILY  . cyanocobalamin 1000 MCG tablet Take 1,000 mcg by mouth daily.  . Evolocumab (REPATHA SURECLICK) 703 MG/ML SOAJ Inject 1 pen into the skin every 14 (fourteen) days.  Vanessa Kick Ethyl (VASCEPA) 1 g CAPS Take 2 capsules (2 g total) by mouth 2 (two) times daily.  . metroNIDAZOLE (METROCREAM) 0.75 % cream Apply topically 2 (two) times daily.  . pantoprazole (PROTONIX) 40  MG tablet Take 1 tablet (40 mg total) by mouth daily. 30 minute before food  . traZODone (DESYREL) 50 MG tablet Take 0.5-1 tablets (25-50 mg total) by mouth at bedtime as needed for sleep.  Marland Kitchen triamcinolone ointment (KENALOG) 0.1 % Apply topically.  Marland Kitchen VITAMIN D, ERGOCALCIFEROL, PO Take 5,000 Units by mouth daily.     Allergies: Ampicillin, Penicillins, Crestor [rosuvastatin calcium], Ezetimibe-simvastatin, Niaspan [niacin er], Vytorin [ezetimibe-simvastatin], Zetia [ezetimibe], Zocor [simvastatin], and Welchol [colesevelam hcl]  Social History   Tobacco Use  . Smoking status: Former Smoker    Packs/day: 0.25    Years: 22.00    Pack years: 5.50    Types: Cigarettes    Quit date: 1980    Years since quitting: 41.0  . Smokeless tobacco: Never Used  Substance Use Topics  . Alcohol use: No  . Drug use: No    Family History  Problem Relation Age of Onset  . Heart disease Mother 43       aortic valve- had AVR- died 6 wks after  . Hyperlipidemia Mother   . Aneurysm Father 91       aortic aneursym  . Heart disease Father   . Heart disease Brother 23       CABG  . Colon cancer Neg Hx   . Stomach cancer Neg Hx     Review of Systems: A 12-system review of systems was performed and was negative except as noted in the HPI.  --------------------------------------------------------------------------------------------------  Physical Exam: BP 140/80 (BP Location: Left Arm, Patient Position: Sitting, Cuff Size: Normal)   Pulse 62   Ht 5' 1"  (1.549 m)   Wt 186 lb (84.4 kg)   SpO2 98%   BMI 35.14 kg/m   General: NAD. HEENT: No conjunctival pallor or scleral icterus. Facemask in place. Neck: Supple without lymphadenopathy, thyromegaly, JVD, or HJR. Lungs: Normal work of breathing. Clear to auscultation bilaterally without wheezes or crackles. Heart: Regular rate and rhythm with 1/6 systolic murmur.  No rubs or gallops. Non-displaced PMI. Abd: Bowel sounds present. Soft, NT/ND without hepatosplenomegaly Ext: No significant lower extremity edema. Skin: Warm and dry without rash.  EKG: Normal sinus rhythm with low voltage and nonspecific T wave changes.  Lab Results  Component Value Date   WBC 7.3 04/30/2019   HGB 14.3 04/30/2019   HCT 42.8 04/30/2019   MCV 86.0 04/30/2019   PLT 294.0  04/30/2019    Lab Results  Component Value Date   NA 140 04/30/2019   K 4.4 04/30/2019   CL 101 04/30/2019   CO2 30 04/30/2019   BUN 15 04/30/2019   CREATININE 0.91 04/30/2019   GLUCOSE 88 04/30/2019   ALT 17 04/30/2019    Lab Results  Component Value Date   CHOL 251 (H) 04/30/2019   HDL 50.50 04/30/2019   LDLCALC 162 (H) 04/30/2019   TRIG 194.0 (H) 04/30/2019   CHOLHDL 5 04/30/2019    --------------------------------------------------------------------------------------------------  ASSESSMENT AND PLAN: Dyspnea on exertion: Chronic and slightly improved from our last visit without intervention.  Interval echo showed preserved LVEF with mild valvular disease that would not explain her symptoms.  Myocardial perfusion stress test was without ischemia.  We have agreed to defer additional testing and intervention at this time.  I encouraged Ms. Marsch to increase her activity to help improve her functional capacity.  Coronary artery calcification and mixed hyperlipidemia: Coronary artery calcification noted on prior CT imaging of the chest.  LDL has also been quite elevated in the past (  near 200).  She is tolerating Repatha and Vascepa well.  We have agreed to check a lipid panel and ALT today.  I will forward the results to our lipid clinic for their assistance with management of lipid therapy.  Hypertension: Blood pressure borderline elevated today but typically better at other visits.  We will defer medication changes today.  I have encouraged sodium restriction.  Follow-up: Return to clinic in 6 months.  Nelva Bush, MD 09/17/2019 1:22 PM

## 2019-09-17 ENCOUNTER — Ambulatory Visit (INDEPENDENT_AMBULATORY_CARE_PROVIDER_SITE_OTHER): Payer: Medicare Other | Admitting: Internal Medicine

## 2019-09-17 ENCOUNTER — Encounter: Payer: Self-pay | Admitting: Internal Medicine

## 2019-09-17 ENCOUNTER — Other Ambulatory Visit: Payer: Self-pay

## 2019-09-17 VITALS — BP 140/80 | HR 62 | Ht 61.0 in | Wt 186.0 lb

## 2019-09-17 DIAGNOSIS — I2584 Coronary atherosclerosis due to calcified coronary lesion: Secondary | ICD-10-CM

## 2019-09-17 DIAGNOSIS — R0600 Dyspnea, unspecified: Secondary | ICD-10-CM

## 2019-09-17 DIAGNOSIS — I2510 Atherosclerotic heart disease of native coronary artery without angina pectoris: Secondary | ICD-10-CM

## 2019-09-17 DIAGNOSIS — E782 Mixed hyperlipidemia: Secondary | ICD-10-CM

## 2019-09-17 DIAGNOSIS — R0609 Other forms of dyspnea: Secondary | ICD-10-CM

## 2019-09-17 DIAGNOSIS — I10 Essential (primary) hypertension: Secondary | ICD-10-CM

## 2019-09-17 NOTE — Patient Instructions (Signed)
Medication Instructions:  Your physician recommends that you continue on your current medications as directed. Please refer to the Current Medication list given to you today.  *If you need a refill on your cardiac medications before your next appointment, please call your pharmacy*  Lab Work: Your physician recommends that you return for lab work in: TODAY - LIPID, ALT.  If you have labs (blood work) drawn today and your tests are completely normal, you will receive your results only by: Marland Kitchen MyChart Message (if you have MyChart) OR . A paper copy in the mail If you have any lab test that is abnormal or we need to change your treatment, we will call you to review the results.  Testing/Procedures: none  Follow-Up: At [facility], you and your health needs are our priority.  As part of our continuing mission to provide you with exceptional heart care, we have created designated Provider Care Teams.  These Care Teams include your primary Cardiologist (physician) and Advanced Practice Providers (APPs -  Physician Assistants and Nurse Practitioners) who all work together to provide you with the care you need, when you need it.  Your next appointment:   6 month(s)  The format for your next appointment:   In Person  Provider:    You may see DR Harrell Gave END or one of the following Advanced Practice Providers on your designated Care Team:    Murray Hodgkins, NP  Christell Faith, PA-C  Marrianne Mood, PA-C

## 2019-09-18 ENCOUNTER — Encounter: Payer: Self-pay | Admitting: Internal Medicine

## 2019-09-18 LAB — LIPID PANEL
Chol/HDL Ratio: 3.6 ratio (ref 0.0–4.4)
Cholesterol, Total: 196 mg/dL (ref 100–199)
HDL: 55 mg/dL (ref >39)
LDL Chol Calc (NIH): 112 mg/dL — ABNORMAL HIGH (ref 0–99)
Triglycerides: 167 mg/dL — ABNORMAL HIGH (ref 0–149)
VLDL Cholesterol Cal: 29 mg/dL (ref 5–40)

## 2019-09-18 LAB — ALT: ALT: 15 IU/L (ref 0–32)

## 2019-09-19 ENCOUNTER — Telehealth: Payer: Self-pay

## 2019-09-19 ENCOUNTER — Telehealth: Payer: Self-pay | Admitting: Pharmacist

## 2019-09-19 MED ORDER — NEXLETOL 180 MG PO TABS: 1.0000 | ORAL_TABLET | Freq: Every day | ORAL | 11 refills | Status: DC

## 2019-09-19 NOTE — Telephone Encounter (Signed)
Call to patient to make her aware of lab results and POC from Dr. Saunders Revel.   No further orders at this time.   We will reach out to her with any further advice from pharmacy.   Advised pt to call for any further questions or concerns.

## 2019-09-19 NOTE — Telephone Encounter (Signed)
Nexletol prior auth approved through 08/27/20. Confirmed $0 copay at pharmacy with [facility]. Pt is aware and will call with concerns.

## 2019-09-19 NOTE — Telephone Encounter (Signed)
Called pt to discuss her lipid panel results - she is tolerating Repatha and Vascepa well. Previously intolerant to rosuvastatin 5mg  every other day, Lipitor, pravastatin, fenofibrate 160mg  and 80mg  (cut tablets in half), Zetia (joint pain with all). Discussed adding Nexletol to her current meds - this would lower LDL an additional ~20% which may be the best we can do given her previous intolerances. She is willing to try this. Will send in prior auth and follow up with pt once approved. Previous grant for her other lipid meds will include coverage for Nexletol so her copay will be $0.

## 2019-09-19 NOTE — Telephone Encounter (Signed)
-----   Message from Nelva Bush, MD sent at 09/19/2019 11:03 AM EST ----- Please let Ms. Schrade know that her cholesterol has improved but is still a little above goal.  I will forward the results to our pharmacy team regarding their recommendations in the setting of Repatha and Vascepa therapy.

## 2019-10-08 ENCOUNTER — Ambulatory Visit
Admission: RE | Admit: 2019-10-08 | Discharge: 2019-10-08 | Disposition: A | Discharge status: Home / Self Care | Payer: Medicare Other | Source: Ambulatory Visit | Attending: Internal Medicine | Admitting: Internal Medicine

## 2019-10-08 DIAGNOSIS — Z1231 Encounter for screening mammogram for malignant neoplasm of breast: Secondary | ICD-10-CM

## 2019-10-22 ENCOUNTER — Other Ambulatory Visit: Payer: Self-pay | Admitting: Family Medicine

## 2019-10-22 ENCOUNTER — Other Ambulatory Visit: Payer: Self-pay | Admitting: Internal Medicine

## 2019-10-22 DIAGNOSIS — F419 Anxiety disorder, unspecified: Secondary | ICD-10-CM

## 2019-10-22 MED ORDER — CITALOPRAM HYDROBROMIDE 40 MG PO TABS: 40.0000 mg | ORAL_TABLET | Freq: Every day | ORAL | 3 refills | Status: DC

## 2019-11-27 ENCOUNTER — Other Ambulatory Visit: Payer: Self-pay

## 2019-12-02 ENCOUNTER — Encounter: Payer: Self-pay | Admitting: Internal Medicine

## 2019-12-02 ENCOUNTER — Ambulatory Visit (INDEPENDENT_AMBULATORY_CARE_PROVIDER_SITE_OTHER): Payer: Medicare Other | Admitting: Internal Medicine

## 2019-12-02 ENCOUNTER — Other Ambulatory Visit: Payer: Self-pay

## 2019-12-02 VITALS — BP 126/78 | HR 61 | Temp 97.4°F | Ht 61.0 in | Wt 181.8 lb

## 2019-12-02 DIAGNOSIS — L989 Disorder of the skin and subcutaneous tissue, unspecified: Secondary | ICD-10-CM

## 2019-12-02 DIAGNOSIS — E785 Hyperlipidemia, unspecified: Secondary | ICD-10-CM

## 2019-12-02 DIAGNOSIS — F329 Major depressive disorder, single episode, unspecified: Secondary | ICD-10-CM

## 2019-12-02 DIAGNOSIS — Z1329 Encounter for screening for other suspected endocrine disorder: Secondary | ICD-10-CM

## 2019-12-02 DIAGNOSIS — Z1231 Encounter for screening mammogram for malignant neoplasm of breast: Secondary | ICD-10-CM | POA: Diagnosis not present

## 2019-12-02 DIAGNOSIS — E538 Deficiency of other specified B group vitamins: Secondary | ICD-10-CM

## 2019-12-02 DIAGNOSIS — F32A Depression, unspecified: Secondary | ICD-10-CM

## 2019-12-02 DIAGNOSIS — I10 Essential (primary) hypertension: Secondary | ICD-10-CM

## 2019-12-02 DIAGNOSIS — Z1389 Encounter for screening for other disorder: Secondary | ICD-10-CM

## 2019-12-02 DIAGNOSIS — F419 Anxiety disorder, unspecified: Secondary | ICD-10-CM

## 2019-12-02 DIAGNOSIS — G4700 Insomnia, unspecified: Secondary | ICD-10-CM | POA: Diagnosis not present

## 2019-12-02 DIAGNOSIS — R5383 Other fatigue: Secondary | ICD-10-CM

## 2019-12-02 MED ORDER — CITALOPRAM HYDROBROMIDE 20 MG PO TABS: 20.0000 mg | ORAL_TABLET | Freq: Every day | ORAL | 3 refills | Status: DC

## 2019-12-02 MED ORDER — TRAZODONE HCL 50 MG PO TABS: 50.0000 mg | ORAL_TABLET | Freq: Every evening | ORAL | 3 refills | Status: DC | PRN

## 2019-12-02 MED ORDER — BUPROPION HCL ER (XL) 150 MG PO TB24: 150.0000 mg | ORAL_TABLET | Freq: Every day | ORAL | 3 refills | Status: DC

## 2019-12-02 NOTE — Patient Instructions (Addendum)
Nature L theanine 100-200 mg at night for sleep  Sleepy time or chamomile tea Cut celexa from 40 mg to 20 mg 1/2 pill new pill is 20 mg daily     Bupropion extended-release tablets (Depression/Mood Disorders) What is this medicine? BUPROPION (byoo PROE pee on) is used to treat depression. This medicine may be used for other purposes; ask your health care provider or pharmacist if you have questions. COMMON BRAND NAME(S): Aplenzin, Budeprion XL, Forfivo XL, Wellbutrin XL What should I tell my health care provider before I take this medicine? They need to know if you have any of these conditions:  an eating disorder, such as anorexia or bulimia  bipolar disorder or psychosis  diabetes or high blood sugar, treated with medication  glaucoma  head injury or brain tumor  heart disease, previous heart attack, or irregular heart beat  high blood pressure  kidney or liver disease  seizures (convulsions)  suicidal thoughts or a previous suicide attempt  Tourette's syndrome  weight loss  an unusual or allergic reaction to bupropion, other medicines, foods, dyes, or preservatives  breast-feeding  pregnant or trying to become pregnant How should I use this medicine? Take this medicine by mouth with a glass of water. Follow the directions on the prescription label. You can take it with or without food. If it upsets your stomach, take it with food. Do not crush, chew, or cut these tablets. This medicine is taken once daily at the same time each day. Do not take your medicine more often than directed. Do not stop taking this medicine suddenly except upon the advice of your doctor. Stopping this medicine too quickly may cause serious side effects or your condition may worsen. A special MedGuide will be given to you by the pharmacist with each prescription and refill. Be sure to read this information carefully each time. Talk to your pediatrician regarding the use of this medicine in  children. Special care may be needed. Overdosage: If you think you have taken too much of this medicine contact a poison control center or emergency room at once. NOTE: This medicine is only for you. Do not share this medicine with others. What if I miss a dose? If you miss a dose, skip the missed dose and take your next tablet at the regular time. Do not take double or extra doses. What may interact with this medicine? Do not take this medicine with any of the following medications:  linezolid  MAOIs like Azilect, Carbex, Eldepryl, Marplan, Nardil, and Parnate  methylene blue (injected into a vein)  other medicines that contain bupropion like Zyban This medicine may also interact with the following medications:  alcohol  certain medicines for anxiety or sleep  certain medicines for blood pressure like metoprolol, propranolol  certain medicines for depression or psychotic disturbances  certain medicines for HIV or AIDS like efavirenz, lopinavir, nelfinavir, ritonavir  certain medicines for irregular heart beat like propafenone, flecainide  certain medicines for Parkinson's disease like amantadine, levodopa  certain medicines for seizures like carbamazepine, phenytoin, phenobarbital  cimetidine  clopidogrel  cyclophosphamide  digoxin  furazolidone  isoniazid  nicotine  orphenadrine  procarbazine  steroid medicines like prednisone or cortisone  stimulant medicines for attention disorders, weight loss, or to stay awake  tamoxifen  theophylline  thiotepa  ticlopidine  tramadol  warfarin This list may not describe all possible interactions. Give your health care provider a list of all the medicines, herbs, non-prescription drugs, or dietary supplements you use.  Also tell them if you smoke, drink alcohol, or use illegal drugs. Some items may interact with your medicine. What should I watch for while using this medicine? Tell your doctor if your symptoms  do not get better or if they get worse. Visit your doctor or healthcare provider for regular checks on your progress. Because it may take several weeks to see the full effects of this medicine, it is important to continue your treatment as prescribed by your doctor. This medicine may cause serious skin reactions. They can happen weeks to months after starting the medicine. Contact your healthcare provider right away if you notice fevers or flu-like symptoms with a rash. The rash may be red or purple and then turn into blisters or peeling of the skin. Or, you might notice a red rash with swelling of the face, lips or lymph nodes in your neck or under your arms. Patients and their families should watch out for new or worsening thoughts of suicide or depression. Also watch out for sudden changes in feelings such as feeling anxious, agitated, panicky, irritable, hostile, aggressive, impulsive, severely restless, overly excited and hyperactive, or not being able to sleep. If this happens, especially at the beginning of treatment or after a change in dose, call your healthcare provider. Avoid alcoholic drinks while taking this medicine. Drinking large amounts of alcoholic beverages, using sleeping or anxiety medicines, or quickly stopping the use of these agents while taking this medicine may increase your risk for a seizure. Do not drive or use heavy machinery until you know how this medicine affects you. This medicine can impair your ability to perform these tasks. Do not take this medicine close to bedtime. It may prevent you from sleeping. Your mouth may get dry. Chewing sugarless gum or sucking hard candy, and drinking plenty of water may help. Contact your doctor if the problem does not go away or is severe. The tablet shell for some brands of this medicine does not dissolve. This is normal. The tablet shell may appear whole in the stool. This is not a cause for concern. What side effects may I notice from  receiving this medicine? Side effects that you should report to your doctor or health care professional as soon as possible:  allergic reactions like skin rash, itching or hives, swelling of the face, lips, or tongue  breathing problems  changes in vision  confusion  elevated mood, decreased need for sleep, racing thoughts, impulsive behavior  fast or irregular heartbeat  hallucinations, loss of contact with reality  increased blood pressure  rash, fever, and swollen lymph nodes  redness, blistering, peeling or loosening of the skin, including inside the mouth  seizures  suicidal thoughts or other mood changes  unusually weak or tired  vomiting Side effects that usually do not require medical attention (report to your doctor or health care professional if they continue or are bothersome):  constipation  headache  loss of appetite  nausea  tremors  weight loss This list may not describe all possible side effects. Call your doctor for medical advice about side effects. You may report side effects to FDA at 1-800-FDA-1088. Where should I keep my medicine? Keep out of the reach of children. Store at room temperature between 15 and 30 degrees C (59 and 86 degrees F). Throw away any unused medicine after the expiration date. NOTE: This sheet is a summary. It may not cover all possible information. If you have questions about this medicine, talk to your  doctor, pharmacist, or health care provider.  2020 Elsevier/Gold Standard (2018-11-07 13:45:31)  Cholesterol Content in Foods Cholesterol is a waxy, fat-like substance that helps to carry fat in the blood. The body needs cholesterol in small amounts, but too much cholesterol can cause damage to the arteries and heart. Most people should eat less than 200 milligrams (mg) of cholesterol a day. Foods with cholesterol  Cholesterol is found in animal-based foods, such as meat, seafood, and dairy. Generally, low-fat dairy  and lean meats have less cholesterol than full-fat dairy and fatty meats. The milligrams of cholesterol per serving (mg per serving) of common cholesterol-containing foods are listed below. Meat and other proteins  Egg -- one large whole egg has 186 mg.  Veal shank -- 4 oz has 141 mg.  Lean ground [place] (93% lean) -- 4 oz has 118 mg.  Fat-trimmed lamb loin -- 4 oz has 106 mg.  Lean ground beef (90% lean) -- 4 oz has 100 mg.  Lobster -- 3.5 oz has 90 mg.  Pork loin chops -- 4 oz has 86 mg.  Canned salmon -- 3.5 oz has 83 mg.  Fat-trimmed beef top loin -- 4 oz has 78 mg.  Frankfurter -- 1 frank (3.5 oz) has 77 mg.  Crab -- 3.5 oz has 71 mg.  Roasted chicken without skin, white meat -- 4 oz has 66 mg.  Light bologna -- 2 oz has 45 mg.  Deli-cut [place] -- 2 oz has 31 mg.  Canned tuna -- 3.5 oz has 31 mg.  Berniece Salines -- 1 oz has 29 mg.  Oysters and mussels (raw) -- 3.5 oz has 25 mg.  Mackerel -- 1 oz has 22 mg.  Trout -- 1 oz has 20 mg.  Pork sausage -- 1 link (1 oz) has 17 mg.  Salmon -- 1 oz has 16 mg.  Tilapia -- 1 oz has 14 mg. Dairy  Soft-serve ice cream --  cup (4 oz) has 103 mg.  Whole-milk yogurt -- 1 cup (8 oz) has 29 mg.  Cheddar cheese -- 1 oz has 28 mg.  American cheese -- 1 oz has 28 mg.  Whole milk -- 1 cup (8 oz) has 23 mg.  2% milk -- 1 cup (8 oz) has 18 mg.  Cream cheese -- 1 tablespoon (Tbsp) has 15 mg.  Cottage cheese --  cup (4 oz) has 14 mg.  Low-fat (1%) milk -- 1 cup (8 oz) has 10 mg.  Sour cream -- 1 Tbsp has 8.5 mg.  Low-fat yogurt -- 1 cup (8 oz) has 8 mg.  Nonfat Greek yogurt -- 1 cup (8 oz) has 7 mg.  Half-and-half cream -- 1 Tbsp has 5 mg. Fats and oils  Cod liver oil -- 1 tablespoon (Tbsp) has 82 mg.  Butter -- 1 Tbsp has 15 mg.  Lard -- 1 Tbsp has 14 mg.  Bacon grease -- 1 Tbsp has 14 mg.  Mayonnaise -- 1 Tbsp has 5-10 mg.  Margarine -- 1 Tbsp has 3-10 mg. Exact amounts of cholesterol in these foods may vary  depending on specific ingredients and brands. Foods without cholesterol Most plant-based foods do not have cholesterol unless you combine them with a food that has cholesterol. Foods without cholesterol include:  Grains and cereals.  Vegetables.  Fruits.  Vegetable oils, such as olive, canola, and sunflower oil.  Legumes, such as peas, beans, and lentils.  Nuts and seeds.  Egg whites. Summary  The body needs cholesterol in small  amounts, but too much cholesterol can cause damage to the arteries and heart.  Most people should eat less than 200 milligrams (mg) of cholesterol a day. This information is not intended to replace advice given to you by your health care provider. Make sure you discuss any questions you have with your health care provider. Document Revised: 07/27/2017 Document Reviewed: 04/10/2017 Elsevier Patient Education  [place].

## 2019-12-02 NOTE — Progress Notes (Signed)
Chief Complaint  Patient presents with  . Follow-up   F/u  1. Left eyebrow open sore area with hard knots in it trying kenalog she reports she had 3 pimples there and was sore and peeling felt like grit also tried vasoline and topical antibiotics w/o improvement h/o right cheek bx PR and left cheek LSC  -will refer [facility] derm 2. HLD on 3 meds nexletol 180 mg qd, repatha, vascepa 1 g and improved will check HLD in 12/24/19  3. 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 HTN on norvasc 5 mg qd controlled    Review of Systems  Constitutional: Positive for malaise/fatigue.  HENT: Negative for hearing loss.   Eyes: Negative for blurred vision.  Respiratory: Negative for shortness of breath.   Cardiovascular: Negative for chest pain.  Gastrointestinal: Negative for abdominal pain.  Musculoskeletal: Negative for falls.  Skin:       Left forehead skin lesion  Psychiatric/Behavioral: Positive for depression. The patient is nervous/anxious.    Past Medical History:  Diagnosis Date  . Anxiety   . CAD (coronary artery disease)   . Fatty liver   . GERD (gastroesophageal reflux disease)   . Heart murmur   . Hemorrhoids   . Hiatal hernia   . History of skin cancer    Right arm  . HTN (hypertension)   . Hyperlipidemia   . Insomnia   . Insomnia   . Pneumonia October 2016  . Rosacea 11/24/2013  . Vitamin D deficiency    Past Surgical History:  Procedure Laterality Date  . APPENDECTOMY    . CATARACT EXTRACTION W/PHACO Left 01/25/2016   Procedure: CATARACT EXTRACTION PHACO AND INTRAOCULAR LENS PLACEMENT (IOC);  Surgeon: Birder Robson, MD;  Location: [facility] ORS;  Service: Ophthalmology;  Laterality: Left;  [place] 33.9 AP% 15.5 CDE 5.28 Fluid Pack Lot # [id number]  . CATARACT EXTRACTION W/PHACO Right 02/22/2016   Procedure: CATARACT EXTRACTION PHACO AND INTRAOCULAR LENS PLACEMENT (IOC);   Surgeon: Birder Robson, MD;  Location: [facility] ORS;  Service: Ophthalmology;  Laterality: Right;  [place] 00:40 AP% 23.6 CDE 9.45 fluid pack lot # [id number]  . TUBAL LIGATION  1981   Family History  Problem Relation Age of Onset  . Heart disease Mother 52       aortic valve- had AVR- died 6 wks after  . Hyperlipidemia Mother   . Aneurysm Father 45       aortic aneursym  . Heart disease Father   . Heart disease Brother 48       CABG  . Colon cancer Neg Hx   . Stomach cancer Neg Hx    Social History   Socioeconomic History  . Marital status: Single    Spouse name: Not on file  . Number of children: Not on file  . Years of education: Not on file  . Highest education level: Not on file  Occupational History  . Not on file  Tobacco Use  . Smoking status: Former Smoker    Packs/day: 0.25    Years: 22.00    Pack years: 5.50    Types: Cigarettes    Quit date: 1980    Years since quitting: 41.2  . Smokeless tobacco: Never Used  Substance and Sexual Activity  . Alcohol use: No  . Drug use: No  . Sexual activity: Not on file  Other Topics  Concern  . 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 Difficulty of Paying Living Expenses: Not hard at all  Food Insecurity: No Food Insecurity  . Worried About Charity fundraiser in the Last Year: Never true  . Ran Out of Food in the Last Year: Never true  Transportation Needs: No Transportation Needs  . Lack of Transportation (Medical): No  . Lack of Transportation (Non-Medical): No  Physical Activity: Insufficiently Active  . Days of Exercise per Week: 2 days  . Minutes of Exercise per Session: 20 min  Stress: No Stress Concern Present  . Feeling of Stress : Not at all  Social Connections:   . Frequency of Communication with Friends and Family:   . Frequency of Social Gatherings with  Friends and Family:   . Attends Religious Services:   . Active Member of Clubs or Organizations:   . Attends Archivist Meetings:   Marland Kitchen Marital Status:   Intimate Partner Violence:   . Fear of Current or Ex-Partner:   . Emotionally Abused:   Marland Kitchen Physically Abused:   . Sexually Abused:    Current Meds  Medication Sig  . amLODipine (NORVASC) 5 MG tablet Take 1 tablet (5 mg total) by mouth daily.  Marland Kitchen aspirin 81 MG tablet Take 81 mg by mouth daily.    . Bempedoic Acid (NEXLETOL) 180 MG TABS Take 1 tablet by mouth daily.  . Biotin 10000 MCG TABS Take 1 tablet by mouth daily.  . Calcium Carb-Cholecalciferol (CALCIUM+D3 PO) Take 1 tablet by mouth daily.  . citalopram (CELEXA) 20 MG tablet Take 1 tablet (20 mg total) by mouth daily.  . cyanocobalamin 1000 MCG tablet Take 1,000 mcg by mouth daily.  . Evolocumab (REPATHA SURECLICK) [id number] MG/ML SOAJ Inject 1 pen into the skin every 14 (fourteen) days.  Vanessa Kick Ethyl (VASCEPA) 1 g CAPS Take 2 capsules (2 g total) by mouth 2 (two) times daily.  . metroNIDAZOLE (METROCREAM) 0.75 % cream Apply topically 2 (two) times daily.  . pantoprazole (PROTONIX) 40 MG tablet Take 1 tablet (40 mg total) by mouth daily. 30 minute before food  . traZODone (DESYREL) 50 MG tablet Take 1 tablet (50 mg total) by mouth at bedtime as needed for sleep.  Marland Kitchen triamcinolone ointment (KENALOG) 0.1 % Apply topically.  . [DISCONTINUED] citalopram (CELEXA) 40 MG tablet Take 1 tablet (40 mg total) by mouth daily.  . [DISCONTINUED] traZODone (DESYREL) 50 MG tablet Take 0.5-1 tablets (25-50 mg total) by mouth at bedtime as needed for sleep.   Allergies  Allergen Reactions  . Ampicillin Hives, Shortness Of Breath and Swelling    Has patient had a PCN reaction causing immediate rash, facial/tongue/throat swelling, SOB or lightheadedness with hypotension: Yes Has patient had a PCN reaction causing severe rash involving mucus membranes or skin necrosis: No Has patient had a PCN  reaction that required hospitalization No Has patient had a PCN reaction occurring within the last 10 years: No If all of the above answers are "NO", then may proceed with Cephalosporin use.   Marland Kitchen Penicillins Hives and Shortness Of Breath    Has patient had a PCN reaction causing immediate rash, facial/tongue/throat swelling, SOB or lightheadedness with hypotension: Yes Has patient had a PCN reaction causing severe rash involving mucus  membranes or skin necrosis: No Has patient had a PCN reaction that required hospitalization No Has patient had a PCN reaction occurring within the last 10 years: No If all of the above answers are "NO", then may proceed with Cephalosporin use.   Marland Kitchen Crestor [Rosuvastatin Calcium]     Burning sensation  . Ezetimibe-Simvastatin   . Niaspan [Niacin Er]     Fatigues, aches.  . Vytorin [Ezetimibe-Simvastatin]   . Zetia [Ezetimibe]   . Zocor [Simvastatin]   . Welchol [Colesevelam Hcl] Other (See Comments)    Severe bloating even on low dose   Recent Results (from the past 2160 hour(s))  ALT     Status: None   Collection Time: 09/17/19  2:13 PM  Result Value Ref Range   ALT 15 0 - 32 IU/L  Lipid panel     Status: Abnormal   Collection Time: 09/17/19  2:13 PM  Result Value Ref Range   Cholesterol, Total 196 100 - 199 mg/dL   Triglycerides 167 (H) 0 - 149 mg/dL   HDL 55 >39 mg/dL   VLDL Cholesterol Cal 29 5 - 40 mg/dL   LDL Chol Calc (NIH) 112 (H) 0 - 99 mg/dL   Chol/HDL Ratio 3.6 0.0 - 4.4 ratio    Comment:                                   T. Chol/HDL Ratio                                             Men  Women                               1/2 Avg.Risk  3.4    3.3                                   Avg.Risk  5.0    4.4                                2X Avg.Risk  9.6    7.1                                3X Avg.Risk 23.4   11.0    Objective  Body mass index is 34.35 kg/m. Wt Readings from Last 3 Encounters:  12/02/19 181 lb 12.8 oz (82.5 kg)   09/17/19 186 lb (84.4 kg)  05/30/19 181 lb (82.1 kg)   Temp Readings from Last 3 Encounters:  12/02/19 (!) 97.4 F (36.3 C) (Temporal)  05/13/18 98.1 F (36.7 C) (Oral)  04/12/18 97.9 F (36.6 C) (Oral)   BP Readings from Last 3 Encounters:  12/02/19 126/78  09/17/19 140/80  05/30/19 124/78   Pulse Readings from Last 3 Encounters:  12/02/19 61  09/17/19 62  05/28/19 61    Physical Exam Vitals and nursing note reviewed.  Constitutional:      Appearance: Normal appearance. She is well-developed and well-groomed.  HENT:     Head: Normocephalic and atraumatic.  Eyes:  Conjunctiva/sclera: Conjunctivae normal.     Pupils: Pupils are equal, round, and reactive to light.  Cardiovascular:     Rate and Rhythm: Normal rate and regular rhythm.     Heart sounds: Normal heart sounds.  Pulmonary:     Effort: Pulmonary effort is normal.     Breath sounds: Normal breath sounds.  Skin:    General: Skin is warm and dry.  Neurological:     General: No focal deficit present.     Mental Status: She is alert and oriented to person, place, and time. Mental status is at baseline.     Gait: Gait normal.  Psychiatric:        Attention and Perception: Attention and perception normal.        Mood and Affect: Mood and affect normal.        Speech: Speech normal.        Behavior: Behavior normal. Behavior is cooperative.        Thought Content: Thought content normal.        Cognition and Memory: Cognition and memory normal.        Judgment: Judgment normal.     Assessment  Plan  Anxiety and depression - Plan: citalopram (CELEXA) 20 MG tablet, buPROPion (WELLBUTRIN XL) 150 MG 24 hr tablet  Anxiety - Plan: citalopram (CELEXA) 20 MG tablet  Insomnia, unspecified type - Plan: traZODone (DESYREL) 50 MG tablet  Hyperlipidemia, unspecified hyperlipidemia type - Plan: Comprehensive metabolic panel, Lipid panel Cont meds  F/u cards 02/2020 Dr. Saunders Revel  Screening mammogram, encounter for -  Plan: MM 3D SCREEN BREAST BILATERAL  Essential hypertension - Plan: Comprehensive metabolic panel, Lipid panel, CBC with Differential/Platelet Cont meds   B12 deficiency - Plan: B12  Thyroid disorder screening - Plan: TSH  Fatigue, unspecified type - Plan: TSH, Urinalysis, Routine w reflex microscopic  Screening for blood or protein in urine - Plan: Urinalysis, Routine w reflex microscopic  Skin lesion - Plan: Ambulatory referral to Dermatology   HM Flu shot utd 06/02/19 prevnar utd  pna had in 2017  2/2shingrix Tdap8/10/20 covid 19 2/2  Labs 11/2019  Mammogram neg 10/08/19 ordered 2022  Out of age window pap 04/22/14 negative  DEXA 06/10/18 osteopenia  Colonoscopy In 2007 hemorrhoids had FIT test negative 07/06/17  Skin Dr. 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 Olivia Mackie McLean-Scocuzza-Internal Medicine

## 2019-12-17 ENCOUNTER — Other Ambulatory Visit (INDEPENDENT_AMBULATORY_CARE_PROVIDER_SITE_OTHER): Payer: Medicare Other

## 2019-12-17 ENCOUNTER — Other Ambulatory Visit: Payer: Self-pay

## 2019-12-17 DIAGNOSIS — R5383 Other fatigue: Secondary | ICD-10-CM

## 2019-12-17 DIAGNOSIS — I10 Essential (primary) hypertension: Secondary | ICD-10-CM | POA: Diagnosis not present

## 2019-12-17 DIAGNOSIS — E538 Deficiency of other specified B group vitamins: Secondary | ICD-10-CM | POA: Diagnosis not present

## 2019-12-17 DIAGNOSIS — E785 Hyperlipidemia, unspecified: Secondary | ICD-10-CM

## 2019-12-17 DIAGNOSIS — Z1329 Encounter for screening for other suspected endocrine disorder: Secondary | ICD-10-CM | POA: Diagnosis not present

## 2019-12-17 DIAGNOSIS — Z1389 Encounter for screening for other disorder: Secondary | ICD-10-CM

## 2019-12-17 LAB — LIPID PANEL
Cholesterol: 124 mg/dL (ref 0–200)
HDL: 47.9 mg/dL (ref >39.00)
LDL Cholesterol: 54 mg/dL (ref 0–99)
NonHDL: 75.73
Total CHOL/HDL Ratio: 3
Triglycerides: 107 mg/dL (ref 0.0–149.0)
VLDL: 21.4 mg/dL (ref 0.0–40.0)

## 2019-12-17 LAB — COMPREHENSIVE METABOLIC PANEL
ALT: 15 U/L (ref 0–35)
AST: 20 U/L (ref 0–37)
Albumin: 4.3 g/dL (ref 3.5–5.2)
Alkaline Phosphatase: 37 U/L — ABNORMAL LOW (ref 39–117)
BUN: 24 mg/dL — ABNORMAL HIGH (ref 6–23)
CO2: 31 mEq/L (ref 19–32)
Calcium: 9.9 mg/dL (ref 8.4–10.5)
Chloride: 102 mEq/L (ref 96–112)
Creatinine, Ser: 0.88 mg/dL (ref 0.40–1.20)
GFR: 62.27 mL/min (ref >60.00)
Glucose, Bld: 109 mg/dL — ABNORMAL HIGH (ref 70–99)
Potassium: 4.1 mEq/L (ref 3.5–5.1)
Sodium: 141 mEq/L (ref 135–145)
Total Bilirubin: 0.6 mg/dL (ref 0.2–1.2)
Total Protein: 6.9 g/dL (ref 6.0–8.3)

## 2019-12-17 LAB — TSH: TSH: 2.52 u[IU]/mL (ref 0.35–4.50)

## 2019-12-17 LAB — VITAMIN B12: Vitamin B-12: >1500 pg/mL — ABNORMAL HIGH (ref 211–911)

## 2019-12-18 LAB — URINALYSIS, ROUTINE W REFLEX MICROSCOPIC
Bacteria, UA: NONE SEEN /HPF
Bilirubin Urine: NEGATIVE
Glucose, UA: NEGATIVE
Hyaline Cast: NONE SEEN /LPF
Ketones, ur: NEGATIVE
Nitrite: NEGATIVE
Protein, ur: NEGATIVE
RBC / HPF: NONE SEEN /HPF (ref 0–2)
Specific Gravity, Urine: 1.021 (ref 1.001–1.03)
Squamous Epithelial / HPF: NONE SEEN /HPF (ref <=5)
pH: <=5 (ref 5.0–8.0)

## 2019-12-18 LAB — CBC WITH DIFFERENTIAL/PLATELET
Basophils Absolute: 0.1 10*3/uL (ref 0.0–0.1)
Basophils Relative: 1.1 % (ref 0.0–3.0)
Eosinophils Absolute: 0.3 10*3/uL (ref 0.0–0.7)
Eosinophils Relative: 3.8 % (ref 0.0–5.0)
HCT: 39.6 % (ref 36.0–46.0)
Hemoglobin: 13.3 g/dL (ref 12.0–15.0)
Lymphocytes Relative: 29 % (ref 12.0–46.0)
Lymphs Abs: 1.9 10*3/uL (ref 0.7–4.0)
MCHC: 33.6 g/dL (ref 30.0–36.0)
MCV: 88.9 fl (ref 78.0–100.0)
Monocytes Absolute: 0.6 10*3/uL (ref 0.1–1.0)
Monocytes Relative: 9 % (ref 3.0–12.0)
Neutro Abs: 3.7 10*3/uL (ref 1.4–7.7)
Neutrophils Relative %: 57.1 % (ref 43.0–77.0)
Platelets: 269 10*3/uL (ref 150.0–400.0)
RBC: 4.46 Mil/uL (ref 3.87–5.11)
RDW: 14.3 % (ref 11.5–15.5)
WBC: 6.6 10*3/uL (ref 4.0–10.5)

## 2019-12-19 ENCOUNTER — Other Ambulatory Visit: Payer: Self-pay | Admitting: Internal Medicine

## 2019-12-19 DIAGNOSIS — R319 Hematuria, unspecified: Secondary | ICD-10-CM

## 2019-12-22 NOTE — Addendum Note (Signed)
Addended byElpidio Galea T on: 12/22/2019 03:51 PM   Modules accepted: Orders

## 2019-12-24 ENCOUNTER — Other Ambulatory Visit (INDEPENDENT_AMBULATORY_CARE_PROVIDER_SITE_OTHER): Payer: Medicare Other

## 2019-12-24 ENCOUNTER — Other Ambulatory Visit: Payer: Self-pay

## 2019-12-24 DIAGNOSIS — N390 Urinary tract infection, site not specified: Secondary | ICD-10-CM | POA: Diagnosis not present

## 2019-12-24 DIAGNOSIS — Z1389 Encounter for screening for other disorder: Secondary | ICD-10-CM

## 2019-12-25 LAB — URINE CULTURE
MICRO NUMBER:: 10415796
SPECIMEN QUALITY:: ADEQUATE

## 2019-12-29 ENCOUNTER — Telehealth: Payer: Self-pay | Admitting: Internal Medicine

## 2019-12-29 DIAGNOSIS — Z79899 Other long term (current) drug therapy: Secondary | ICD-10-CM

## 2019-12-29 DIAGNOSIS — E782 Mixed hyperlipidemia: Secondary | ICD-10-CM

## 2019-12-29 NOTE — Telephone Encounter (Signed)
Pt c/o medication issue:  1. Name of Medication: Vascepa  2. How are you currently taking this medication (dosage and times per day)? 2 capsules 2 times daily  3. Are you having a reaction (difficulty breathing--STAT)? Affecting speech, constipation, frequent urination has to wear depends, pain in getting out of bed, seems like "floors are unlevel", no energy.   4. What is your medication issue?   Patient stopped the medication a week and her symptoms have subsided.  Please call to discuss.

## 2019-12-29 NOTE — Telephone Encounter (Addendum)
Spoke with the patient. Patient sts that all of the symptoms she listed in the previous message started with the initiation of Vascepa. She stopped taking the medication last week and all of the symptoms have subsided. Advised the patient to continue to [place]. I will fwd the update to Dr. Saunders Revel and we will call back with his recommendation.  Patient agreeable with the plan and voiced appreciation for the call.

## 2019-12-29 NOTE — Telephone Encounter (Signed)
I recommend holding Vascepa.  I will forward her concerns to the lipid clinic for their thoughts and recommendations.  Nelva Bush, MD [facility]

## 2019-12-30 NOTE — Telephone Encounter (Signed)
Patient on Vascepa since October 2020 and was able to tolerate per f/u noted on January. Doubtful Vascepa was the cause for all symptoms described.  Also noted patient also intolerant to Welchol , fenofibrate 160mg  (but able to take 80mg  daily), and Lovaza.  Recommendation:  1. Continue Repatha every 2 weeks and Nexletol 180mg  daily therapy  2. Discontinue Vascepa 3. Start fenofibrate 48mg  daily with supper 4. Avoid simple sugars in diet 5. Repeat BMP and fasting lipid panel in 8 weeks

## 2020-01-01 DIAGNOSIS — L814 Other melanin hyperpigmentation: Secondary | ICD-10-CM | POA: Diagnosis not present

## 2020-01-01 DIAGNOSIS — D492 Neoplasm of unspecified behavior of bone, soft tissue, and skin: Secondary | ICD-10-CM | POA: Diagnosis not present

## 2020-01-01 DIAGNOSIS — C44311 Basal cell carcinoma of skin of nose: Secondary | ICD-10-CM | POA: Diagnosis not present

## 2020-01-01 DIAGNOSIS — L570 Actinic keratosis: Secondary | ICD-10-CM | POA: Diagnosis not present

## 2020-01-01 DIAGNOSIS — D229 Melanocytic nevi, unspecified: Secondary | ICD-10-CM | POA: Diagnosis not present

## 2020-01-01 NOTE — Telephone Encounter (Signed)
I agree with plan as outlined by our pharmacy team.  Nelva Bush, MD [place]

## 2020-01-02 MED ORDER — FENOFIBRATE 48 MG PO TABS: 48.0000 mg | ORAL_TABLET | Freq: Every day | ORAL | 2 refills | Status: DC

## 2020-01-02 NOTE — Telephone Encounter (Signed)
Patient says she's feeling so much better since stopping the Vascepa. She had no control with her bladder and could not finish thoughts or think clearly while on the Vascepa. Hips would wake her up at night. Now, these things have subsided.  She verbalized understanding of the plan as follows: 1. Continue Repatha every 2 weeks and Nexletol 180mg  daily therapy  2. Discontinue Vascepa 3. Start fenofibrate 48mg  daily with supper 4. Avoid simple sugars in diet 5. Repeat BMP and fasting lipid panel in 8 weeks   Rx sent to pharmacy. She is aware to get fasting lab work at [facility] in 8 weeks ~ July 7th, 2021.

## 2020-01-29 ENCOUNTER — Encounter: Payer: Self-pay | Admitting: Internal Medicine

## 2020-02-03 DIAGNOSIS — L814 Other melanin hyperpigmentation: Secondary | ICD-10-CM | POA: Diagnosis not present

## 2020-02-03 DIAGNOSIS — L578 Other skin changes due to chronic exposure to nonionizing radiation: Secondary | ICD-10-CM | POA: Diagnosis not present

## 2020-02-03 DIAGNOSIS — L570 Actinic keratosis: Secondary | ICD-10-CM | POA: Diagnosis not present

## 2020-02-03 DIAGNOSIS — Z85828 Personal history of other malignant neoplasm of skin: Secondary | ICD-10-CM | POA: Diagnosis not present

## 2020-02-03 DIAGNOSIS — C44311 Basal cell carcinoma of skin of nose: Secondary | ICD-10-CM | POA: Diagnosis not present

## 2020-03-10 ENCOUNTER — Telehealth: Payer: Self-pay | Admitting: Internal Medicine

## 2020-03-10 DIAGNOSIS — K219 Gastro-esophageal reflux disease without esophagitis: Secondary | ICD-10-CM

## 2020-03-10 DIAGNOSIS — K449 Diaphragmatic hernia without obstruction or gangrene: Secondary | ICD-10-CM

## 2020-03-10 MED ORDER — PANTOPRAZOLE SODIUM 40 MG PO TBEC: 40.0000 mg | DELAYED_RELEASE_TABLET | Freq: Every day | ORAL | 3 refills | Status: DC

## 2020-03-10 NOTE — Telephone Encounter (Signed)
Pt is out of pantoprazole (PROTONIX) 40 MG tablet. Please send to [facility]

## 2020-03-17 ENCOUNTER — Other Ambulatory Visit: Payer: Self-pay

## 2020-03-17 ENCOUNTER — Ambulatory Visit: Payer: Medicare Other | Admitting: Internal Medicine

## 2020-03-17 ENCOUNTER — Encounter: Payer: Self-pay | Admitting: Internal Medicine

## 2020-03-17 VITALS — BP 116/78 | HR 62 | Ht 61.0 in | Wt 177.1 lb

## 2020-03-17 DIAGNOSIS — E782 Mixed hyperlipidemia: Secondary | ICD-10-CM

## 2020-03-17 DIAGNOSIS — I10 Essential (primary) hypertension: Secondary | ICD-10-CM

## 2020-03-17 DIAGNOSIS — R42 Dizziness and giddiness: Secondary | ICD-10-CM | POA: Diagnosis not present

## 2020-03-17 DIAGNOSIS — I2510 Atherosclerotic heart disease of native coronary artery without angina pectoris: Secondary | ICD-10-CM | POA: Diagnosis not present

## 2020-03-17 DIAGNOSIS — R0600 Dyspnea, unspecified: Secondary | ICD-10-CM | POA: Diagnosis not present

## 2020-03-17 DIAGNOSIS — R0609 Other forms of dyspnea: Secondary | ICD-10-CM

## 2020-03-17 NOTE — Patient Instructions (Signed)
Medication Instructions:  Your physician recommends that you continue on your current medications as directed. Please refer to the Current Medication list given to you today.  *If you need a refill on your cardiac medications before your next appointment, please call your pharmacy*   Lab Work: None ordered If you have labs (blood work) drawn today and your tests are completely normal, you will receive your results only by: Marland Kitchen MyChart Message (if you have MyChart) OR . A paper copy in the mail If you have any lab test that is abnormal or we need to change your treatment, we will call you to review the results.   Testing/Procedures: Your physician has requested that you have a carotid duplex. This test is an ultrasound of the carotid arteries in your neck. It looks at blood flow through these arteries that supply the brain with blood. Allow one hour for this exam. There are no restrictions or special instructions.     Follow-Up: At [facility], you and your health needs are our priority.  As part of our continuing mission to provide you with exceptional heart care, we have created designated Provider Care Teams.  These Care Teams include your primary Cardiologist (physician) and Advanced Practice Providers (APPs -  Physician Assistants and Nurse Practitioners) who all work together to provide you with the care you need, when you need it.  We recommend signing up for the patient portal called "MyChart".  Sign up information is provided on this After Visit Summary.  MyChart is used to connect with patients for Virtual Visits (Telemedicine).  Patients are able to view lab/test results, encounter notes, upcoming appointments, etc.  Non-urgent messages can be sent to your provider as well.   To learn more about what you can do with MyChart, go to [email or web address].    Your next appointment:   6 month(s)  The format for your next appointment:   In Person  Provider:    You may see   Dr. Saunders Revel or one of the following Advanced Practice Providers on your designated Care Team:    Murray Hodgkins, NP  Christell Faith, PA-C  Marrianne Mood, PA-C    Other Instructions N/A

## 2020-03-17 NOTE — Progress Notes (Signed)
Follow-up Outpatient Visit Date: 03/17/2020  Primary Care Provider: McLean-Scocuzza, Nino Glow, MD [place] [address]  Chief Complaint: Follow-up coronary artery disease and hyperlipidemia  HPI:  Sharon Mcclure is a 79 y.o. female with history of coronary artery disease (coronary artery calcification previously noted but no ischemia or scar on Myoview in 05/2019), hypertension, hyperlipidemia, hiatal hernia, fatty liver, and anxiety, who presents for follow-up of coronary artery disease and shortness of breath.  I last saw her in January, at which time she reported slight improvement in her shortness of breath.  She noted intermittent dizziness leading toa few falls.  I recommended increasing activity to help improve her function capacity.  Bempedoic acid was subsequently added to Repatha and Vascepa due to suboptimal LDL control.  Ms. Crusoe subsequently reached out to our office due to concerns about Vascepa.  The medication was subsequently stopped in favor of fenofibrate, though Ms. Hoganson ultimately stopped this as well due to side effects (confusion and constipation).  Today, Ms. Mccroskey reports feeling relatively well.  She complains of some orthostatic lightheadedness and dizziness when turning her head.  She has not passed out or fallen.  She denies chest pain, palpitations, and lightheadedness.  Her chronic exertional dyspnea is unchanged from prior visits.  --------------------------------------------------------------------------------------------------  Cardiovascular History & Procedures: Cardiovascular Problems:  Coronary artery calcification  Hyperlipidemia  Chronic dyspnea on exertion  Risk Factors:  Coronary artery calcification, hypertension, hyperlipidemia, obesity, and age greater than 65  Cath/PCI:  None  CV Surgery:  None  EP Procedures and Devices:  None  Non-Invasive Evaluation(s):  TTE (09/03/2019): Normal LV size and wall  thickness.  LVEF 60-65% with grade 1 diastolic dysfunction and normal wall motion.  Normal RV size and function.  Mild to moderate left atrial enlargement.  Aortic sclerosis without stenosis.  Mild pulmonary hypertension.  Pharmacologic MPI (06/25/2019): Intermediate risk study with calculated LVEF <30% (normal LV function by visual estimation).  No evidence of significant ischemia or scar.  Recent CV Pertinent Labs: Lab Results  Component Value Date   CHOL 124 12/17/2019   CHOL 196 09/17/2019   HDL 47.90 12/17/2019   HDL 55 09/17/2019   LDLCALC 54 12/17/2019   LDLCALC 112 (H) 09/17/2019   LDLCALC 203 (H) 05/13/2018   TRIG 107.0 12/17/2019   CHOLHDL 3 12/17/2019   K 4.1 12/17/2019   K 3.6 04/01/2012   MG 1.8 04/30/2019   BUN 24 (H) 12/17/2019   BUN 22 (H) 04/01/2012   CREATININE 0.88 12/17/2019   CREATININE 0.77 05/13/2018    Past medical and surgical history were reviewed and updated in EPIC.  Current Meds  Medication Sig  . amLODipine (NORVASC) 5 MG tablet Take 1 tablet (5 mg total) by mouth daily.  Marland Kitchen aspirin 81 MG tablet Take 81 mg by mouth daily.    . Bempedoic Acid (NEXLETOL) 180 MG TABS Take 1 tablet by mouth daily.  . Biotin 10000 MCG TABS Take 1 tablet by mouth daily.  Marland Kitchen buPROPion (WELLBUTRIN XL) 150 MG 24 hr tablet Take 1 tablet (150 mg total) by mouth daily.  . Calcium Carb-Cholecalciferol (CALCIUM+D3 PO) Take 1 tablet by mouth daily.  . citalopram (CELEXA) 20 MG tablet Take 1 tablet (20 mg total) by mouth daily.  . cyanocobalamin 1000 MCG tablet Take 1,000 mcg by mouth daily.  . Evolocumab (REPATHA SURECLICK) 725 MG/ML SOAJ Inject 1 pen into the skin every 14 (fourteen) days.  . metroNIDAZOLE (METROCREAM) 0.75 % cream Apply topically 2 (  two) times daily.  . pantoprazole (PROTONIX) 40 MG tablet Take 1 tablet (40 mg total) by mouth daily. 30 minute before food  . traZODone (DESYREL) 50 MG tablet Take 1 tablet (50 mg total) by mouth at bedtime as needed for sleep.  Marland Kitchen  triamcinolone ointment (KENALOG) 0.1 % Apply topically.  Marland Kitchen VITAMIN D, ERGOCALCIFEROL, PO Take 5,000 Units by mouth daily.    Allergies: Ampicillin, Penicillins, Crestor [rosuvastatin calcium], Ezetimibe-simvastatin, Niaspan [niacin er], Vytorin [ezetimibe-simvastatin], Zetia [ezetimibe], Zocor [simvastatin], and Welchol [colesevelam hcl]  Social History   Tobacco Use  . Smoking status: Former Smoker    Packs/day: 0.25    Years: 22.00    Pack years: 5.50    Types: Cigarettes    Quit date: 1980    Years since quitting: 41.5  . Smokeless tobacco: Never Used  Vaping Use  . Vaping Use: Never used  Substance Use Topics  . Alcohol use: No  . Drug use: No    Family History  Problem Relation Age of Onset  . Heart disease Mother 5       aortic valve- had AVR- died 6 wks after  . Hyperlipidemia Mother   . Aneurysm Father 87       aortic aneursym  . Heart disease Father   . Heart disease Brother 10       CABG  . Colon cancer Neg Hx   . Stomach cancer Neg Hx     Review of Systems: A 12-system review of systems was performed and was negative except as noted in the HPI.  --------------------------------------------------------------------------------------------------  Physical Exam: BP 116/78 (BP Location: Right Arm, Patient Position: Sitting, Cuff Size: Normal)   Pulse 62   Ht 5\' 1"  (1.549 m)   Wt 177 lb 2 oz (80.3 kg)   SpO2 94%   BMI 33.47 kg/m   General: NAD. Neck: No JVD or HJR.  No carotid bruit. Lungs: Clear to auscultation without wheezes or crackles. Heart: Regular rate and rhythm with 1/6 systolic murmur.  No rubs or gallops. Abdomen: Soft, nontender, nondistended. Extremities: No lower extremity edema.  EKG: Normal sinus rhythm without abnormality.  Lab Results  Component Value Date   WBC 6.6 12/17/2019   HGB 13.3 12/17/2019   HCT 39.6 12/17/2019   MCV 88.9 12/17/2019   PLT 269.0 12/17/2019    Lab Results  Component Value Date   NA 141 12/17/2019     K 4.1 12/17/2019   CL 102 12/17/2019   CO2 31 12/17/2019   BUN 24 (H) 12/17/2019   CREATININE 0.88 12/17/2019   GLUCOSE 109 (H) 12/17/2019   ALT 15 12/17/2019    Lab Results  Component Value Date   CHOL 124 12/17/2019   HDL 47.90 12/17/2019   LDLCALC 54 12/17/2019   TRIG 107.0 12/17/2019   CHOLHDL 3 12/17/2019    --------------------------------------------------------------------------------------------------  ASSESSMENT AND PLAN: Coronary artery disease: No symptoms to suggest worsening of nonobstructive CAD.  Continue current medications for secondary prevention.  Lightheadedness/dizziness: Somewhat orthostatic and also positional with head rotation.  Examination today unrevealing.  Given significant hyperlipidemia and coronary artery calcification, carotid artery stenosis is a potential.  We have agreed to obtain carotid Dopplers to exclude significant disease.  Dyspnea on exertion: Stable.  Prior cardiac work-up has been reassuring.  No further intervention at this time.  Hyperlipidemia: LDL well controlled on current regimen of Repatha and rosuvastatin.  Unfortunately, Ms. Ingman has been intolerant of fenofibrate and Vascepa to help lower triglycerides.  However, triglycerides  have only been mildly elevated.  I therefore think it is reasonable to defer additional therapy aimed at lowering her triglycerides for the time being.  Hypertension: Blood pressure well controlled today.  Defer medication changes at this time.  Follow-up: Return to clinic in 6 months.  Nelva Bush, MD 03/17/2020 2:44 PM

## 2020-03-18 ENCOUNTER — Encounter: Payer: Self-pay | Admitting: Internal Medicine

## 2020-03-18 DIAGNOSIS — R42 Dizziness and giddiness: Secondary | ICD-10-CM | POA: Insufficient documentation

## 2020-03-31 DIAGNOSIS — Z85828 Personal history of other malignant neoplasm of skin: Secondary | ICD-10-CM | POA: Diagnosis not present

## 2020-03-31 DIAGNOSIS — L905 Scar conditions and fibrosis of skin: Secondary | ICD-10-CM | POA: Diagnosis not present

## 2020-04-07 ENCOUNTER — Ambulatory Visit (INDEPENDENT_AMBULATORY_CARE_PROVIDER_SITE_OTHER): Payer: Medicare Other | Admitting: Internal Medicine

## 2020-04-07 ENCOUNTER — Other Ambulatory Visit: Payer: Self-pay

## 2020-04-07 ENCOUNTER — Encounter: Payer: Self-pay | Admitting: Internal Medicine

## 2020-04-07 VITALS — BP 138/84 | HR 66 | Temp 98.1°F | Ht 61.0 in | Wt 176.0 lb

## 2020-04-07 DIAGNOSIS — R600 Localized edema: Secondary | ICD-10-CM

## 2020-04-07 DIAGNOSIS — L719 Rosacea, unspecified: Secondary | ICD-10-CM | POA: Diagnosis not present

## 2020-04-07 DIAGNOSIS — I10 Essential (primary) hypertension: Secondary | ICD-10-CM | POA: Diagnosis not present

## 2020-04-07 DIAGNOSIS — R739 Hyperglycemia, unspecified: Secondary | ICD-10-CM

## 2020-04-07 DIAGNOSIS — C44311 Basal cell carcinoma of skin of nose: Secondary | ICD-10-CM | POA: Insufficient documentation

## 2020-04-07 DIAGNOSIS — E782 Mixed hyperlipidemia: Secondary | ICD-10-CM | POA: Diagnosis not present

## 2020-04-07 DIAGNOSIS — K219 Gastro-esophageal reflux disease without esophagitis: Secondary | ICD-10-CM

## 2020-04-07 LAB — LIPID PANEL
Cholesterol: 122 mg/dL (ref 0–200)
HDL: 53.1 mg/dL (ref >39.00)
LDL Cholesterol: 45 mg/dL (ref 0–99)
NonHDL: 69.14
Total CHOL/HDL Ratio: 2
Triglycerides: 123 mg/dL (ref 0.0–149.0)
VLDL: 24.6 mg/dL (ref 0.0–40.0)

## 2020-04-07 LAB — CBC WITH DIFFERENTIAL/PLATELET
Basophils Absolute: 0.1 10*3/uL (ref 0.0–0.1)
Basophils Relative: 1.3 % (ref 0.0–3.0)
Eosinophils Absolute: 0.3 10*3/uL (ref 0.0–0.7)
Eosinophils Relative: 5.1 % — ABNORMAL HIGH (ref 0.0–5.0)
HCT: 41.2 % (ref 36.0–46.0)
Hemoglobin: 13.5 g/dL (ref 12.0–15.0)
Lymphocytes Relative: 31.5 % (ref 12.0–46.0)
Lymphs Abs: 2 10*3/uL (ref 0.7–4.0)
MCHC: 32.8 g/dL (ref 30.0–36.0)
MCV: 88.4 fl (ref 78.0–100.0)
Monocytes Absolute: 0.6 10*3/uL (ref 0.1–1.0)
Monocytes Relative: 9.7 % (ref 3.0–12.0)
Neutro Abs: 3.3 10*3/uL (ref 1.4–7.7)
Neutrophils Relative %: 52.4 % (ref 43.0–77.0)
Platelets: 266 10*3/uL (ref 150.0–400.0)
RBC: 4.66 Mil/uL (ref 3.87–5.11)
RDW: 13.5 % (ref 11.5–15.5)
WBC: 6.4 10*3/uL (ref 4.0–10.5)

## 2020-04-07 LAB — COMPREHENSIVE METABOLIC PANEL
ALT: 19 U/L (ref 0–35)
AST: 26 U/L (ref 0–37)
Albumin: 4.4 g/dL (ref 3.5–5.2)
Alkaline Phosphatase: 39 U/L (ref 39–117)
BUN: 20 mg/dL (ref 6–23)
CO2: 29 mEq/L (ref 19–32)
Calcium: 9.9 mg/dL (ref 8.4–10.5)
Chloride: 102 mEq/L (ref 96–112)
Creatinine, Ser: 0.94 mg/dL (ref 0.40–1.20)
GFR: 57.66 mL/min — ABNORMAL LOW (ref >60.00)
Glucose, Bld: 100 mg/dL — ABNORMAL HIGH (ref 70–99)
Potassium: 4.5 mEq/L (ref 3.5–5.1)
Sodium: 142 mEq/L (ref 135–145)
Total Bilirubin: 0.5 mg/dL (ref 0.2–1.2)
Total Protein: 6.9 g/dL (ref 6.0–8.3)

## 2020-04-07 LAB — HEMOGLOBIN A1C: Hgb A1c MFr Bld: 5.7 % (ref 4.6–6.5)

## 2020-04-07 MED ORDER — METRONIDAZOLE 0.75 % EX CREA: TOPICAL_CREAM | Freq: Two times a day (BID) | CUTANEOUS | 11 refills | Status: DC

## 2020-04-07 MED ORDER — AMLODIPINE BESYLATE 5 MG PO TABS: 5.0000 mg | ORAL_TABLET | Freq: Every day | ORAL | 3 refills | Status: DC

## 2020-04-07 NOTE — Patient Instructions (Addendum)
[facility]  [place], [place] [address]  [place], MD  [address]  [place] [phone]  [facility]  [place], [place] [address]  [phone]  [phone] (Fax   Dermatology Due 06/2020 mid to 3rd week Nov 2021

## 2020-04-07 NOTE — Progress Notes (Signed)
Chief Complaint  Patient presents with  . Follow-up   F/u  1. Face doing better AK left eyebrow and BCC right nose healing  2. HTN norvasc 5 mg qd  3. Anxiety controlled on celexa 20 mg qd, wellbutrin xl 150 mg qd  Review of Systems  Constitutional: Negative for weight loss.  HENT: Negative for hearing loss.   Eyes: Negative for blurred vision.  Respiratory: Negative for shortness of breath.   Cardiovascular: Negative for chest pain.  Gastrointestinal: Negative for abdominal pain.  Musculoskeletal: Negative for falls.  Skin: Negative for rash.  Neurological: Negative for headaches.  Psychiatric/Behavioral: Negative for depression.   Past Medical History:  Diagnosis Date  . Anxiety   . CAD (coronary artery disease)   . Cancer (HCC)    basal cell-right nostril  . Fatty liver   . GERD (gastroesophageal reflux disease)   . Heart murmur   . Hemorrhoids   . Hiatal hernia   . History of skin cancer    Right arm  . HTN (hypertension)   . Hyperlipidemia   . Insomnia   . Insomnia   . Pneumonia October 2016  . Rosacea 11/24/2013  . Vitamin D deficiency    Past Surgical History:  Procedure Laterality Date  . APPENDECTOMY    . BASAL CELL CARCINOMA EXCISION     right nostril  . CATARACT EXTRACTION W/PHACO Left 01/25/2016   Procedure: CATARACT EXTRACTION PHACO AND INTRAOCULAR LENS PLACEMENT (IOC);  Surgeon: Birder Robson, MD;  Location: [facility] ORS;  Service: Ophthalmology;  Laterality: Left;  [place] 33.9 AP% 15.5 CDE 5.28 Fluid Pack Lot # [id number]  . CATARACT EXTRACTION W/PHACO Right 02/22/2016   Procedure: CATARACT EXTRACTION PHACO AND INTRAOCULAR LENS PLACEMENT (IOC);  Surgeon: Birder Robson, MD;  Location: [facility] ORS;  Service: Ophthalmology;  Laterality: Right;  [place] 00:40 AP% 23.6 CDE 9.45 fluid pack lot # [id number]  . MOHS SURGERY     bcc right nasal ala 01/2020  . TUBAL LIGATION  1981   Family History  Problem Relation Age of Onset  . Heart disease Mother 4        aortic valve- had AVR- died 6 wks after  . Hyperlipidemia Mother   . Aneurysm Father 11       aortic aneursym  . Heart disease Father   . Heart disease Brother 60       CABG  . Colon cancer Neg Hx   . Stomach cancer Neg Hx    Social History   Socioeconomic History  . Marital status: Single    Spouse name: Not on file  . Number of children: Not on file  . Years of education: Not on file  . Highest education level: Not on file  Occupational History  . Not on file  Tobacco Use  . Smoking status: Former Smoker    Packs/day: 0.25    Years: 22.00    Pack years: 5.50    Types: Cigarettes    Quit date: 1980    Years since quitting: 41.6  . Smokeless tobacco: Never Used  Vaping Use  . Vaping Use: Never used  Substance and Sexual Activity  . Alcohol use: No  . Drug use: No  . Sexual activity: Not on file  Other Topics Concern  . 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 Difficulty of Paying Living Expenses: Not hard at all  Food Insecurity: No Food Insecurity  . Worried About Charity fundraiser in the Last Year: Never true  . Ran Out of Food in the Last Year: Never true  Transportation Needs: No Transportation Needs  . Lack of Transportation (Medical): No  . Lack of Transportation (Non-Medical): No  Physical Activity: Insufficiently Active  . Days of Exercise per Week: 2 days  . Minutes of Exercise per Session: 20 min  Stress: No Stress Concern Present  . Feeling of Stress : Not at all  Social Connections:   . Frequency of Communication with Friends and Family:   . Frequency of Social Gatherings with Friends and Family:   . Attends Religious Services:   . Active Member of Clubs or Organizations:   . Attends Archivist Meetings:   Marland Kitchen Marital Status:   Intimate Partner Violence:   . Fear of Current or Ex-Partner:    . Emotionally Abused:   Marland Kitchen Physically Abused:   . Sexually Abused:    Current Meds  Medication Sig  . amLODipine (NORVASC) 5 MG tablet Take 1 tablet (5 mg total) by mouth daily.  Marland Kitchen aspirin 81 MG tablet Take 81 mg by mouth daily.    . Bempedoic Acid (NEXLETOL) 180 MG TABS Take 1 tablet by mouth daily.  . Biotin 10000 MCG TABS Take 1 tablet by mouth daily.  Marland Kitchen buPROPion (WELLBUTRIN XL) 150 MG 24 hr tablet Take 1 tablet (150 mg total) by mouth daily.  . Calcium Carb-Cholecalciferol (CALCIUM+D3 PO) Take 1 tablet by mouth daily.  . citalopram (CELEXA) 20 MG tablet Take 1 tablet (20 mg total) by mouth daily.  . cyanocobalamin 1000 MCG tablet Take 1,000 mcg by mouth daily.  . Evolocumab (REPATHA SURECLICK) 845 MG/ML SOAJ Inject 1 pen into the skin every 14 (fourteen) days.  . metroNIDAZOLE (METROCREAM) 0.75 % cream Apply topically 2 (two) times daily.  . pantoprazole (PROTONIX) 40 MG tablet Take 1 tablet (40 mg total) by mouth daily. 30 minute before food  . traZODone (DESYREL) 50 MG tablet Take 1 tablet (50 mg total) by mouth at bedtime as needed for sleep.  Marland Kitchen triamcinolone ointment (KENALOG) 0.1 % Apply topically.  Marland Kitchen VITAMIN D, ERGOCALCIFEROL, PO Take 5,000 Units by mouth daily.  . [DISCONTINUED] amLODipine (NORVASC) 5 MG tablet Take 1 tablet (5 mg total) by mouth daily.  . [DISCONTINUED] metroNIDAZOLE (METROCREAM) 0.75 % cream Apply topically 2 (two) times daily.   Allergies  Allergen Reactions  . Ampicillin Hives, Shortness Of Breath and Swelling    Has patient had a PCN reaction causing immediate rash, facial/tongue/throat swelling, SOB or lightheadedness with hypotension: Yes Has patient had a PCN reaction causing severe rash involving mucus membranes or skin necrosis: No Has patient had a PCN reaction that required hospitalization No Has patient had a PCN reaction occurring within the last 10 years: No If all of the above answers are "NO", then may proceed with Cephalosporin use.   Marland Kitchen  Penicillins Hives and Shortness Of Breath    Has patient had a PCN reaction causing immediate rash, facial/tongue/throat swelling, SOB or lightheadedness with hypotension: Yes Has patient had a PCN reaction causing severe rash involving mucus membranes or skin necrosis: No Has patient had a PCN reaction that required hospitalization No Has patient had a PCN reaction occurring within the last 10 years: No If all of the above answers are "NO", then  may proceed with Cephalosporin use.   Marland Kitchen Crestor [Rosuvastatin Calcium]     Burning sensation  . Ezetimibe-Simvastatin   . Niaspan [Niacin Er]     Fatigues, aches.  . Vytorin [Ezetimibe-Simvastatin]   . Zetia [Ezetimibe]   . Zocor [Simvastatin]   . Fenofibrate     Constipation and confusion  . Welchol [Colesevelam Hcl] Other (See Comments)    Severe bloating even on low dose   No results found for this or any previous visit (from the past 2160 hour(s)). Objective  Body mass index is 33.25 kg/m. Wt Readings from Last 3 Encounters:  04/07/20 176 lb (79.8 kg)  03/17/20 177 lb 2 oz (80.3 kg)  12/02/19 181 lb 12.8 oz (82.5 kg)   Temp Readings from Last 3 Encounters:  04/07/20 98.1 F (36.7 C) (Oral)  12/02/19 (!) 97.4 F (36.3 C) (Temporal)  05/13/18 98.1 F (36.7 C) (Oral)   BP Readings from Last 3 Encounters:  04/07/20 138/84  03/17/20 116/78  12/02/19 126/78   Pulse Readings from Last 3 Encounters:  04/07/20 66  03/17/20 62  12/02/19 61    Physical Exam Vitals and nursing note reviewed.  Constitutional:      Appearance: Normal appearance. She is well-developed and well-groomed. She is obese.  HENT:     Head: Normocephalic and atraumatic.  Eyes:     Conjunctiva/sclera: Conjunctivae normal.     Pupils: Pupils are equal, round, and reactive to light.  Cardiovascular:     Rate and Rhythm: Normal rate and regular rhythm.     Heart sounds: Normal heart sounds. No murmur heard.   Pulmonary:     Effort: Pulmonary effort is  normal.     Breath sounds: Normal breath sounds.  Skin:    General: Skin is warm and dry.  Neurological:     General: No focal deficit present.     Mental Status: She is alert and oriented to person, place, and time. Mental status is at baseline.     Gait: Gait normal.  Psychiatric:        Attention and Perception: Attention and perception normal.        Mood and Affect: Mood and affect normal.        Speech: Speech normal.        Behavior: Behavior normal. Behavior is cooperative.        Thought Content: Thought content normal.        Cognition and Memory: Cognition and memory normal.        Judgment: Judgment normal.     Assessment  Plan  Essential hypertension - Plan: Comprehensive metabolic panel, Lipid panel, CBC with Differential/Platelet, amLODipine (NORVASC) 5 MG tablet  Mixed hyperlipidemia - Plan: Lipid panel  Hyperglycemia - Plan: Hemoglobin A1c  Rosacea - Plan: metroNIDAZOLE (METROCREAM) 0.75 % cream Basal cell carcinoma (BCC) of skin of nose  [facility] 06/2020   HM Flu shot utd 06/02/19 prevnar utd  pna had in 2017 2/2shingrix Tdap8/10/20 covid 19 2/2  Labs 03/2020  Mammogram neg 10/08/19 ordered 2022  Out of age window pap 04/22/14 negative  DEXA 06/10/18 osteopenia  Colonoscopy In 2007 hemorrhoids had FIT test negative 07/06/17 due at f/u 06/2020 or after   Skin Dr. 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 Olivia Mackie McLean-Scocuzza-Internal Medicine

## 2020-04-13 ENCOUNTER — Other Ambulatory Visit: Payer: Self-pay | Admitting: Internal Medicine

## 2020-04-13 ENCOUNTER — Telehealth: Payer: Self-pay | Admitting: Internal Medicine

## 2020-04-13 DIAGNOSIS — R42 Dizziness and giddiness: Secondary | ICD-10-CM

## 2020-04-13 MED ORDER — FUROSEMIDE 20 MG PO TABS: 20.0000 mg | ORAL_TABLET | Freq: Every day | ORAL | 5 refills | Status: DC | PRN

## 2020-04-13 NOTE — Telephone Encounter (Signed)
Pt called and said that she did want to start taking a fluid pill

## 2020-04-13 NOTE — Telephone Encounter (Signed)
Please advise 

## 2020-04-13 NOTE — Addendum Note (Signed)
Addended by: Orland Mustard on: 04/13/2020 02:13 PM   Modules accepted: Orders

## 2020-04-21 ENCOUNTER — Other Ambulatory Visit: Payer: Self-pay

## 2020-04-21 ENCOUNTER — Ambulatory Visit (INDEPENDENT_AMBULATORY_CARE_PROVIDER_SITE_OTHER): Payer: Medicare Other

## 2020-04-21 DIAGNOSIS — R42 Dizziness and giddiness: Secondary | ICD-10-CM | POA: Diagnosis not present

## 2020-04-26 ENCOUNTER — Telehealth: Payer: Self-pay | Admitting: Internal Medicine

## 2020-04-26 NOTE — Telephone Encounter (Signed)
Patient would like to discuss her medication Repatha. States she is unable to get any help with this anymore .

## 2020-04-27 NOTE — Telephone Encounter (Signed)
Pt voiced understanding

## 2020-04-27 NOTE — Telephone Encounter (Signed)
Please check on [employer] status

## 2020-04-27 NOTE — Telephone Encounter (Signed)
Called and spoke w/pt and stated that they are in a gap where they have no funds left on their grant but that they will be eligible to renew at the end of September so we offered a month of samples to them.

## 2020-04-27 NOTE — Telephone Encounter (Signed)
°  When she went to pick up the [place] and Nexletol and it was too much for her to afford them as she is in the donut hole. Gave her the RepathaReady phone number to go ahead and call to see if she qualifies for patient assistance.  She also said the Nexletol was going to cost over $100. Advised that I was not sure of the assistance programs for that medication and will ask the pharmacist. She said very good things and how helpful Raquel was when she went to the [facility] in [place]. Routing to pharmacy to see about savings program with the Nexletol.

## 2020-05-17 ENCOUNTER — Ambulatory Visit (INDEPENDENT_AMBULATORY_CARE_PROVIDER_SITE_OTHER): Payer: Medicare Other

## 2020-05-17 VITALS — Ht 61.0 in | Wt 171.3 lb

## 2020-05-17 DIAGNOSIS — Z0000 Encounter for general adult medical examination without abnormal findings: Secondary | ICD-10-CM | POA: Diagnosis not present

## 2020-05-17 NOTE — Progress Notes (Signed)
Subjective:   Sharon Mcclure is a 79 y.o. female who presents for Medicare Annual (Subsequent) preventive examination.  Review of Systems    No ROS.  Medicare Wellness Virtual Visit.        Objective:    Today's Vitals   05/17/20 1035  Weight: 171 lb 4.8 oz (77.7 kg)  Height: 5\' 1"  (1.549 m)   Body mass index is 32.37 kg/m.  Advanced Directives 05/17/2020 05/15/2019 05/13/2018 02/22/2016 01/25/2016 06/28/2015 06/27/2015  Does Patient Have a Medical Advance Directive? Yes No No Yes Yes No No  Type of Paramedic of [place];Living will - - [place];Living will Living will - -  Does patient want to make changes to medical advance directive? No - Patient declined - - - - - -  Copy of [place] in Chart? No - copy requested - - - - - -  Would patient like information on creating a medical advance directive? - Yes (MAU/Ambulatory/Procedural Areas - Information given) Yes (Inpatient - patient requests chaplain consult to create a medical advance directive) - - - No - patient declined information    Current Medications (verified) Outpatient Encounter Medications as of 05/17/2020  Medication Sig  . amLODipine (NORVASC) 5 MG tablet Take 1 tablet (5 mg total) by mouth daily.  Marland Kitchen aspirin 81 MG tablet Take 81 mg by mouth daily.    . Bempedoic Acid (NEXLETOL) 180 MG TABS Take 1 tablet by mouth daily.  . Biotin 10000 MCG TABS Take 1 tablet by mouth daily.  Marland Kitchen buPROPion (WELLBUTRIN XL) 150 MG 24 hr tablet Take 1 tablet (150 mg total) by mouth daily.  . Calcium Carb-Cholecalciferol (CALCIUM+D3 PO) Take 1 tablet by mouth daily.  . citalopram (CELEXA) 20 MG tablet Take 1 tablet (20 mg total) by mouth daily.  . cyanocobalamin 1000 MCG tablet Take 1,000 mcg by mouth daily.  . Evolocumab (REPATHA SURECLICK) 009 MG/ML SOAJ Inject 1 pen into the skin every 14 (fourteen) days.  . furosemide (LASIX) 20 MG tablet Take 1 tablet (20 mg total) by  mouth daily as needed.  . metroNIDAZOLE (METROCREAM) 0.75 % cream Apply topically 2 (two) times daily.  . pantoprazole (PROTONIX) 40 MG tablet Take 1 tablet (40 mg total) by mouth daily. 30 minute before food  . traZODone (DESYREL) 50 MG tablet Take 1 tablet (50 mg total) by mouth at bedtime as needed for sleep.  Marland Kitchen VITAMIN D, ERGOCALCIFEROL, PO Take 5,000 Units by mouth daily.   No facility-administered encounter medications on file as of 05/17/2020.    Allergies (verified) Ampicillin, Penicillins, Crestor [rosuvastatin calcium], Ezetimibe-simvastatin, Niaspan [niacin er], Vytorin [ezetimibe-simvastatin], Zetia [ezetimibe], Zocor [simvastatin], Fenofibrate, and Welchol [colesevelam hcl]   History: Past Medical History:  Diagnosis Date  . Anxiety   . CAD (coronary artery disease)   . Cancer (HCC)    basal cell-right nostril  . Fatty liver   . GERD (gastroesophageal reflux disease)   . Heart murmur   . Hemorrhoids   . Hiatal hernia   . History of skin cancer    Right arm  . HTN (hypertension)   . Hyperlipidemia   . Insomnia   . Insomnia   . Pneumonia October 2016  . Rosacea 11/24/2013  . Vitamin D deficiency    Past Surgical History:  Procedure Laterality Date  . APPENDECTOMY    . BASAL CELL CARCINOMA EXCISION     right nostril  . CATARACT EXTRACTION W/PHACO Left 01/25/2016  Procedure: CATARACT EXTRACTION PHACO AND INTRAOCULAR LENS PLACEMENT (IOC);  Surgeon: Birder Robson, MD;  Location: [facility] ORS;  Service: Ophthalmology;  Laterality: Left;  [place] 33.9 AP% 15.5 CDE 5.28 Fluid Pack Lot # [id number]  . CATARACT EXTRACTION W/PHACO Right 02/22/2016   Procedure: CATARACT EXTRACTION PHACO AND INTRAOCULAR LENS PLACEMENT (IOC);  Surgeon: Birder Robson, MD;  Location: [facility] ORS;  Service: Ophthalmology;  Laterality: Right;  [place] 00:40 AP% 23.6 CDE 9.45 fluid pack lot # [id number]  . MOHS SURGERY     bcc right nasal ala 01/2020  . TUBAL LIGATION  1981   Family History  Problem  Relation Age of Onset  . Heart disease Mother 54       aortic valve- had AVR- died 6 wks after  . Hyperlipidemia Mother   . Aneurysm Father 41       aortic aneursym  . Heart disease Father   . Heart disease Brother 39       CABG  . Colon cancer Neg Hx   . Stomach cancer Neg Hx    Social History   Socioeconomic History  . Marital status: Single    Spouse name: Not on file  . Number of children: Not on file  . Years of education: Not on file  . Highest education level: Not on file  Occupational History  . Not on file  Tobacco Use  . Smoking status: Former Smoker    Packs/day: 0.25    Years: 22.00    Pack years: 5.50    Types: Cigarettes    Quit date: 1980    Years since quitting: 41.7  . Smokeless tobacco: Never Used  Vaping Use  . Vaping Use: Never used  Substance and Sexual Activity  . Alcohol use: No  . Drug use: No  . Sexual activity: Not on file  Other Topics Concern  . 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 Difficulty of Paying Living Expenses: Not hard at all  Food Insecurity: No Food Insecurity  . Worried About Charity fundraiser in the Last Year: Never true  . Ran Out of Food in the Last Year: Never true  Transportation Needs: No Transportation Needs  . Lack of Transportation (Medical): No  . Lack of Transportation (Non-Medical): No  Physical Activity:   . Days of Exercise per Week: Not on file  . Minutes of Exercise per Session: Not on file  Stress: No Stress Concern Present  . Feeling of Stress : Not at all  Social Connections: Unknown  . Frequency of Communication with Friends and Family: More than three times a week  . Frequency of Social Gatherings with Friends and Family: Not on file  . Attends Religious Services: Not on file  . Active Member of Clubs or Organizations: Not on file  . Attends  Archivist Meetings: Not on file  . Marital Status: Not on file    Tobacco Counseling Counseling given: Not Answered   Clinical Intake:  Pre-visit preparation completed: Yes        Diabetes: No  How often do you need to have someone help you when you read instructions, pamphlets, or other written materials from your doctor or pharmacy?: 1 - Never Interpreter Needed?: No      Activities of Daily  Living In your present state of health, do you have any difficulty performing the following activities: 05/17/2020  Hearing? Y  Comment Hearing aids  Vision? N  Difficulty concentrating or making decisions? N  Walking or climbing stairs? N  Dressing or bathing? N  Doing errands, shopping? N  Preparing Food and eating ? N  Using the Toilet? N  In the past six months, have you accidently leaked urine? N  Do you have problems with loss of bowel control? N  Managing your Medications? N  Managing your Finances? N  Housekeeping or managing your Housekeeping? N  Some recent data might be hidden    Patient Care Team: McLean-Scocuzza, Nino Glow, MD as PCP - General (Internal Medicine)  Indicate any recent Medical Services you may have received from other than [facility] providers in the past year (date may be approximate).     Assessment:   This is a routine wellness examination for [place].  I connected with [place] today by telephone and verified that I am speaking with the correct person using two identifiers. Location patient: home Location provider: work Persons participating in the virtual visit: patient, Marine scientist.    I discussed the limitations, risks, security and privacy concerns of performing an evaluation and management service by telephone and the availability of in person appointments. The patient expressed understanding and verbally consented to this telephonic visit.    Interactive audio and video telecommunications were attempted between this provider and patient,  however failed, due to patient having technical difficulties OR patient did not have access to video capability.  We continued and completed visit with audio only.  Some vital signs may be absent or patient reported.   Hearing/Vision screen  Hearing Screening   125Hz  250Hz  500Hz  1000Hz  2000Hz  3000Hz  4000Hz  6000Hz  8000Hz   Right ear:           Left ear:           Comments: Hearing aid, bilateral   Vision Screening Comments: Wears corrective lenses Visual acuity not assessed, virtual visit.  They have seen their ophthalmologist in the last 12 months.    Dietary issues and exercise activities discussed: Current Exercise Habits: Home exercise routine, Type of exercise: walking, Intensity: Mild  Low carb diet Good water intake  Goals      Patient Stated   .  I want to lose weight (pt-stated)      Less 167lb Stay active  Stay hydrated Portion control      Depression Screen PHQ 2/9 Scores 05/17/2020 04/07/2020 12/02/2019 05/30/2019 05/15/2019 05/13/2018 04/12/2018  PHQ - 2 Score 0 0 3 0 0 4 0  PHQ- 9 Score 0 0 9 - - 6 -    Fall Risk Fall Risk  05/17/2020 04/07/2020 12/02/2019 05/30/2019 05/15/2019  Falls in the past year? (No Data) 1 0 0 0  Comment None since last reported less than 1 month ago. - - - -  Number falls in past yr: - 0 0 - -  Injury with Fall? - 0 0 - -  Risk for fall due to : - History of fall(s) - - -  Follow up Falls evaluation completed Falls evaluation completed Falls evaluation completed - -   Handrails in use when climbing stairs? Yes Home free of loose throw rugs in walkways, pet beds, electrical cords, etc? Yes  Adequate lighting in your home to reduce risk of falls? Yes   ASSISTIVE DEVICES UTILIZED TO PREVENT FALLS:  Life alert? Yes  Use of  a cane, walker or w/c? No  Grab bars in the bathroom? Yes  Shower chair or bench in shower? Yes  Elevated toilet seat or a handicapped toilet? No   TIMED UP AND GO:  Was the test performed? No . Virtual visit.    Cognitive Function:  6CIT Screen 05/17/2020 05/15/2019 05/13/2018  What Year? 0 points 0 points 0 points  What month? 0 points 0 points 0 points  What time? 0 points 0 points 0 points  Count back from 20 0 points 0 points 0 points  Months in reverse 0 points 0 points 0 points  Repeat phrase 2 points 0 points 2 points  Total Score 2 0 2    Immunizations Immunization History  Administered Date(s) Administered  . Fluad Quad(high Dose 65+) 06/02/2019  . Influenza Whole 05/29/2007  . Influenza, High Dose Seasonal PF 06/01/2014, 04/26/2020  . Influenza,inj,Quad PF,6+ Mos 06/29/2015, 05/13/2018  . Influenza-Unspecified 05/28/2013  . PFIZER SARS-COV-2 Vaccination 09/26/2019, 10/17/2019  . Pneumococcal Conjugate-13 04/22/2014  . Pneumococcal Polysaccharide-23 06/12/2005, 09/13/2015  . Tdap 04/07/2019  . Zoster 10/08/2007  . Zoster Recombinat (Shingrix) 03/06/2019, 05/21/2019   Health Maintenance Health Maintenance  Topic Date Due  . Hepatitis C Screening  05/17/2021 (Originally 17-Jul-1941)  . TETANUS/TDAP  04/06/2029  . INFLUENZA VACCINE  Completed  . DEXA SCAN  Completed  . COVID-19 Vaccine  Completed  . PNA vac Low Risk Adult  Completed   Dental Screening: Recommended annual dental exams for proper oral hygiene. Dentures.   Hepatitis C Screening- deferred.  Community Resource Referral / Chronic Care Management: CRR required this visit?  No   CCM required this visit?  No      Plan:   Keep all routine maintenance appointments.   Follow up 10/13/20 @ 10:30  I have personally reviewed and noted the following in the patient's chart:   . Medical and social history . Use of alcohol, tobacco or illicit drugs  . Current medications and supplements . Functional ability and status . Nutritional status . Physical activity . Advanced directives . List of other physicians . Hospitalizations, surgeries, and ER visits in previous 12 months . Vitals . Screenings to include  cognitive, depression, and falls . Referrals and appointments  In addition, I have reviewed and discussed with patient certain preventive protocols, quality metrics, and best practice recommendations. A written personalized care plan for preventive services as well as general preventive health recommendations were provided to patient via mail.     Varney Biles, LPN   4/43/1540

## 2020-05-17 NOTE — Patient Instructions (Addendum)
[name] , Thank you for taking time to come for your Medicare Wellness Visit. I appreciate your ongoing commitment to your health goals. Please review the following plan we discussed and let me know if I can assist you in the future.   These are the goals we discussed: Goals      Patient Stated   .  I want to lose weight (pt-stated)      Less 167lb Stay active  Stay hydrated Portion control       This is a list of the screening recommended for you and due dates:  Health Maintenance  Topic Date Due  .  Hepatitis C: One time screening is recommended by Center for Disease Control  (CDC) for  adults born from [birth year] through 1963.   05/17/2021*  . Tetanus Vaccine  04/06/2029  . Flu Shot  Completed  . DEXA scan (bone density measurement)  Completed  . COVID-19 Vaccine  Completed  . Pneumonia vaccines  Completed  *Topic was postponed. The date shown is not the original due date.    Immunizations Immunization History  Administered Date(s) Administered  . Fluad Quad(high Dose 65+) 06/02/2019  . Influenza Whole 05/29/2007  . Influenza, High Dose Seasonal PF 06/01/2014, 04/26/2020  . Influenza,inj,Quad PF,6+ Mos 06/29/2015, 05/13/2018  . Influenza-Unspecified 05/28/2013  . PFIZER SARS-COV-2 Vaccination 09/26/2019, 10/17/2019  . Pneumococcal Conjugate-13 04/22/2014  . Pneumococcal Polysaccharide-23 06/12/2005, 09/13/2015  . Tdap 04/07/2019  . Zoster 10/08/2007  . Zoster Recombinat (Shingrix) 03/06/2019, 05/21/2019   Keep all routine maintenance appointments.   Follow up 10/13/20 @ 10:30  Advanced directives: End of life planning; Advance aging; Advanced directives discussed.  Copy of current HCPOA/Living Will requested.    Conditions/risks identified: none new.  Follow up in one year for your annual wellness visit.   Preventive Care 77 Years and Older, Female Preventive care refers to lifestyle choices and visits with your health care provider that can promote health and  wellness. What does preventive care include?  A yearly physical exam. This is also called an annual well check.  Dental exams once or twice a year.  Routine eye exams. Ask your health care provider how often you should have your eyes checked.  Personal lifestyle choices, including:  Daily care of your teeth and gums.  Regular physical activity.  Eating a healthy diet.  Avoiding tobacco and drug use.  Limiting alcohol use.  Practicing safe sex.  Taking low-dose aspirin every day.  Taking vitamin and mineral supplements as recommended by your health care provider. What happens during an annual well check? The services and screenings done by your health care provider during your annual well check will depend on your age, overall health, lifestyle risk factors, and family history of disease. Counseling  Your health care provider may ask you questions about your:  Alcohol use.  Tobacco use.  Drug use.  Emotional well-being.  Home and relationship well-being.  Sexual activity.  Eating habits.  History of falls.  Memory and ability to understand (cognition).  Work and work Statistician.  Reproductive health. Screening  You may have the following tests or measurements:  Height, weight, and BMI.  Blood pressure.  Lipid and cholesterol levels. These may be checked every 5 years, or more frequently if you are over 30 years old.  Skin check.  Lung cancer screening. You may have this screening every year starting at age 29 if you have a 30-pack-year history of smoking and currently smoke or have quit within  the past 15 years.  Fecal occult blood test (FOBT) of the stool. You may have this test every year starting at age 27.  Flexible sigmoidoscopy or colonoscopy. You may have a sigmoidoscopy every 5 years or a colonoscopy every 10 years starting at age 54.  Hepatitis C blood test.  Hepatitis B blood test.  Sexually transmitted disease (STD)  testing.  Diabetes screening. This is done by checking your blood sugar (glucose) after you have not eaten for a while (fasting). You may have this done every 1-3 years.  Bone density scan. This is done to screen for osteoporosis. You may have this done starting at age 80.  Mammogram. This may be done every 1-2 years. Talk to your health care provider about how often you should have regular mammograms. Talk with your health care provider about your test results, treatment options, and if necessary, the need for more tests. Vaccines  Your health care provider may recommend certain vaccines, such as:  Influenza vaccine. This is recommended every year.  Tetanus, diphtheria, and acellular pertussis (Tdap, Td) vaccine. You may need a Td booster every 10 years.  Zoster vaccine. You may need this after age 4.  Pneumococcal 13-valent conjugate (PCV13) vaccine. One dose is recommended after age 53.  Pneumococcal polysaccharide (PPSV23) vaccine. One dose is recommended after age 31. Talk to your health care provider about which screenings and vaccines you need and how often you need them. This information is not intended to replace advice given to you by your health care provider. Make sure you discuss any questions you have with your health care provider. Document Released: 09/10/2015 Document Revised: 05/03/2016 Document Reviewed: 06/15/2015 Elsevier Interactive Patient Education  2017 [place] Prevention in the Home Falls can cause injuries. They can happen to people of all ages. There are many things you can do to make your home safe and to help prevent falls. What can I do on the outside of my home?  Regularly fix the edges of walkways and driveways and fix any cracks.  Remove anything that might make you trip as you walk through a door, such as a raised step or threshold.  Trim any bushes or trees on the path to your home.  Use bright outdoor lighting.  Clear any walking  paths of anything that might make someone trip, such as rocks or tools.  Regularly check to see if handrails are loose or broken. Make sure that both sides of any steps have handrails.  Any raised decks and porches should have guardrails on the edges.  Have any leaves, snow, or ice cleared regularly.  Use sand or salt on walking paths during winter.  Clean up any spills in your garage right away. This includes oil or grease spills. What can I do in the bathroom?  Use night lights.  Install grab bars by the toilet and in the tub and shower. Do not use towel bars as grab bars.  Use non-skid mats or decals in the tub or shower.  If you need to sit down in the shower, use a plastic, non-slip stool.  Keep the floor dry. Clean up any water that spills on the floor as soon as it happens.  Remove soap buildup in the tub or shower regularly.  Attach bath mats securely with double-sided non-slip rug tape.  Do not have throw rugs and other things on the floor that can make you trip. What can I do in the bedroom?  Use night  lights.  Make sure that you have a light by your bed that is easy to reach.  Do not use any sheets or blankets that are too big for your bed. They should not hang down onto the floor.  Have a firm chair that has side arms. You can use this for support while you get dressed.  Do not have throw rugs and other things on the floor that can make you trip. What can I do in the kitchen?  Clean up any spills right away.  Avoid walking on wet floors.  Keep items that you use a lot in easy-to-reach places.  If you need to reach something above you, use a strong step stool that has a grab bar.  Keep electrical cords out of the way.  Do not use floor polish or wax that makes floors slippery. If you must use wax, use non-skid floor wax.  Do not have throw rugs and other things on the floor that can make you trip. What can I do with my stairs?  Do not leave any items  on the stairs.  Make sure that there are handrails on both sides of the stairs and use them. Fix handrails that are broken or loose. Make sure that handrails are as long as the stairways.  Check any carpeting to make sure that it is firmly attached to the stairs. Fix any carpet that is loose or worn.  Avoid having throw rugs at the top or bottom of the stairs. If you do have throw rugs, attach them to the floor with carpet tape.  Make sure that you have a light switch at the top of the stairs and the bottom of the stairs. If you do not have them, ask someone to add them for you. What else can I do to help prevent falls?  Wear shoes that:  Do not have high heels.  Have rubber bottoms.  Are comfortable and fit you well.  Are closed at the toe. Do not wear sandals.  If you use a stepladder:  Make sure that it is fully opened. Do not climb a closed stepladder.  Make sure that both sides of the stepladder are locked into place.  Ask someone to hold it for you, if possible.  Clearly mark and make sure that you can see:  Any grab bars or handrails.  First and last steps.  Where the edge of each step is.  Use tools that help you move around (mobility aids) if they are needed. These include:  Canes.  Walkers.  Scooters.  Crutches.  Turn on the lights when you go into a dark area. Replace any light bulbs as soon as they burn out.  Set up your furniture so you have a clear path. Avoid moving your furniture around.  If any of your floors are uneven, fix them.  If there are any pets around you, be aware of where they are.  Review your medicines with your doctor. Some medicines can make you feel dizzy. This can increase your chance of falling. Ask your doctor what other things that you can do to help prevent falls. This information is not intended to replace advice given to you by your health care provider. Make sure you discuss any questions you have with your health care  provider. Document Released: 06/10/2009 Document Revised: 01/20/2016 Document Reviewed: 09/18/2014 Elsevier Interactive Patient Education  2017 [employer].

## 2020-05-31 LAB — HM HEPATITIS C SCREENING LAB: HM Hepatitis Screen: NEGATIVE

## 2020-06-01 ENCOUNTER — Encounter: Payer: Self-pay | Admitting: Internal Medicine

## 2020-06-02 ENCOUNTER — Telehealth: Payer: Self-pay | Admitting: Internal Medicine

## 2020-06-02 NOTE — Telephone Encounter (Signed)
Called and explained to the pt that these are dire times for the donut whole and that we also have limited samples and that instead of every 2 weeks make it last 3 weeks to help it last longer. Also that they will have to buy the next 2 months as we have sampled them the past 2. The pt voiced gratitude and understanding.

## 2020-06-02 NOTE — Telephone Encounter (Signed)
Spoke to patient. Reports she got samples from [place] office last month. She still cannot afford Repatha because of the donut hole. Last conversation with Pharmacy staff at [facility] said that grant may be available the end of September. Routing to pharmacy to address assistance.  In the meantime, will leave Repatha samples for patient to come to [facility] to pick up. Medication Samples have been provided to the patient.  Drug name: Repatha       Strength: 140 mg/ml        Qty: 2 pens  LOT: [id number]  Exp.Date: 03/23

## 2020-06-02 NOTE — Telephone Encounter (Signed)
Patient would like to discuss her assistance with Repatha. States she is due to renew this medication next week and has some questions about "where to get it"

## 2020-06-11 ENCOUNTER — Emergency Department (HOSPITAL_COMMUNITY): Payer: Medicare Other

## 2020-06-11 ENCOUNTER — Emergency Department (HOSPITAL_COMMUNITY)
Admission: EM | Admit: 2020-06-11 | Discharge: 2020-06-11 | Disposition: A | Discharge status: Home / Self Care | Payer: Medicare Other | Attending: Emergency Medicine | Admitting: Emergency Medicine

## 2020-06-11 ENCOUNTER — Encounter (HOSPITAL_COMMUNITY): Payer: Self-pay | Admitting: Emergency Medicine

## 2020-06-11 DIAGNOSIS — W06XXXA Fall from bed, initial encounter: Secondary | ICD-10-CM | POA: Diagnosis not present

## 2020-06-11 DIAGNOSIS — S3991XA Unspecified injury of abdomen, initial encounter: Secondary | ICD-10-CM | POA: Insufficient documentation

## 2020-06-11 DIAGNOSIS — I2510 Atherosclerotic heart disease of native coronary artery without angina pectoris: Secondary | ICD-10-CM | POA: Diagnosis not present

## 2020-06-11 DIAGNOSIS — Z79899 Other long term (current) drug therapy: Secondary | ICD-10-CM | POA: Insufficient documentation

## 2020-06-11 DIAGNOSIS — S0083XA Contusion of other part of head, initial encounter: Secondary | ICD-10-CM | POA: Diagnosis not present

## 2020-06-11 DIAGNOSIS — R079 Chest pain, unspecified: Secondary | ICD-10-CM | POA: Insufficient documentation

## 2020-06-11 DIAGNOSIS — Z87891 Personal history of nicotine dependence: Secondary | ICD-10-CM | POA: Diagnosis not present

## 2020-06-11 DIAGNOSIS — W19XXXA Unspecified fall, initial encounter: Secondary | ICD-10-CM

## 2020-06-11 DIAGNOSIS — Z85828 Personal history of other malignant neoplasm of skin: Secondary | ICD-10-CM | POA: Insufficient documentation

## 2020-06-11 DIAGNOSIS — S0993XA Unspecified injury of face, initial encounter: Secondary | ICD-10-CM | POA: Diagnosis not present

## 2020-06-11 DIAGNOSIS — M545 Low back pain, unspecified: Secondary | ICD-10-CM

## 2020-06-11 DIAGNOSIS — M47816 Spondylosis without myelopathy or radiculopathy, lumbar region: Secondary | ICD-10-CM | POA: Diagnosis not present

## 2020-06-11 DIAGNOSIS — I10 Essential (primary) hypertension: Secondary | ICD-10-CM | POA: Diagnosis not present

## 2020-06-11 DIAGNOSIS — M5459 Other low back pain: Secondary | ICD-10-CM | POA: Insufficient documentation

## 2020-06-11 DIAGNOSIS — I6782 Cerebral ischemia: Secondary | ICD-10-CM | POA: Diagnosis not present

## 2020-06-11 DIAGNOSIS — S0990XA Unspecified injury of head, initial encounter: Secondary | ICD-10-CM | POA: Diagnosis not present

## 2020-06-11 DIAGNOSIS — S0003XA Contusion of scalp, initial encounter: Secondary | ICD-10-CM | POA: Diagnosis not present

## 2020-06-11 DIAGNOSIS — S3992XA Unspecified injury of lower back, initial encounter: Secondary | ICD-10-CM | POA: Diagnosis not present

## 2020-06-11 DIAGNOSIS — S299XXA Unspecified injury of thorax, initial encounter: Secondary | ICD-10-CM | POA: Diagnosis not present

## 2020-06-11 DIAGNOSIS — S199XXA Unspecified injury of neck, initial encounter: Secondary | ICD-10-CM | POA: Diagnosis not present

## 2020-06-11 LAB — CBC WITH DIFFERENTIAL/PLATELET
Abs Immature Granulocytes: 0.04 10*3/uL (ref 0.00–0.07)
Basophils Absolute: 0.1 10*3/uL (ref 0.0–0.1)
Basophils Relative: 1 %
Eosinophils Absolute: 0.1 10*3/uL (ref 0.0–0.5)
Eosinophils Relative: 1 %
HCT: 44.2 % (ref 36.0–46.0)
Hemoglobin: 14.3 g/dL (ref 12.0–15.0)
Immature Granulocytes: 0 %
Lymphocytes Relative: 15 %
Lymphs Abs: 1.7 10*3/uL (ref 0.7–4.0)
MCH: 29.2 pg (ref 26.0–34.0)
MCHC: 32.4 g/dL (ref 30.0–36.0)
MCV: 90.4 fL (ref 80.0–100.0)
Monocytes Absolute: 0.9 10*3/uL (ref 0.1–1.0)
Monocytes Relative: 8 %
Neutro Abs: 8.6 10*3/uL — ABNORMAL HIGH (ref 1.7–7.7)
Neutrophils Relative %: 75 %
Platelets: 262 10*3/uL (ref 150–400)
RBC: 4.89 MIL/uL (ref 3.87–5.11)
RDW: 13.5 % (ref 11.5–15.5)
WBC: 11.4 10*3/uL — ABNORMAL HIGH (ref 4.0–10.5)
nRBC: 0 % (ref 0.0–0.2)

## 2020-06-11 LAB — BASIC METABOLIC PANEL
Anion gap: 11 (ref 5–15)
BUN: 18 mg/dL (ref 8–23)
CO2: 27 mmol/L (ref 22–32)
Calcium: 9.6 mg/dL (ref 8.9–10.3)
Chloride: 101 mmol/L (ref 98–111)
Creatinine, Ser: 0.89 mg/dL (ref 0.44–1.00)
GFR, Estimated: >60 mL/min (ref >60)
Glucose, Bld: 111 mg/dL — ABNORMAL HIGH (ref 70–99)
Potassium: 4.1 mmol/L (ref 3.5–5.1)
Sodium: 139 mmol/L (ref 135–145)

## 2020-06-11 LAB — URINALYSIS, ROUTINE W REFLEX MICROSCOPIC
Bacteria, UA: NONE SEEN
Bilirubin Urine: NEGATIVE
Glucose, UA: NEGATIVE mg/dL
Ketones, ur: NEGATIVE mg/dL
Leukocytes,Ua: NEGATIVE
Nitrite: NEGATIVE
Protein, ur: NEGATIVE mg/dL
Specific Gravity, Urine: 1.014 (ref 1.005–1.030)
pH: 6 (ref 5.0–8.0)

## 2020-06-11 MED ORDER — LIDOCAINE 5 % EX PTCH: 1.0000 | MEDICATED_PATCH | CUTANEOUS | 0 refills | Status: DC

## 2020-06-11 MED ORDER — IOHEXOL 300 MG/ML  SOLN
100.0000 mL | Freq: Once | INTRAMUSCULAR | Status: AC | PRN
  Administered 2020-06-11: 100 mL via INTRAVENOUS

## 2020-06-11 MED ORDER — LIDOCAINE-EPINEPHRINE-TETRACAINE (LET) TOPICAL GEL
3.0000 mL | Freq: Once | TOPICAL | Status: AC
  Administered 2020-06-11: 3 mL via TOPICAL
  Filled 2020-06-11: qty 3

## 2020-06-11 MED ORDER — MORPHINE SULFATE (PF) 2 MG/ML IV SOLN
2.0000 mg | Freq: Once | INTRAVENOUS | Status: AC
  Administered 2020-06-11: 2 mg via INTRAVENOUS
  Filled 2020-06-11: qty 1

## 2020-06-11 MED ORDER — FENTANYL CITRATE (PF) 100 MCG/2ML IJ SOLN
50.0000 ug | Freq: Once | INTRAMUSCULAR | Status: AC
  Administered 2020-06-11: 50 ug via INTRAVENOUS
  Filled 2020-06-11: qty 2

## 2020-06-11 NOTE — ED Provider Notes (Signed)
[facility] EMERGENCY DEPARTMENT Provider Note   CSN: [id number] Arrival date & time: 06/11/20  1062     History Chief Complaint  Patient presents with  . Fall    Sharon Mcclure is a 79 y.o. female presents today for fall.  Patient reports she was getting out of bed this morning, swung her legs off the side lost her balance falling and striking the right side of her forehead on a side table.  She reports pain was immediate, sharp throbbing constant nonradiating worse with palpation improved with rest.  Patient noticed bleeding immediately and controlled with direct pressure.  Additionally patient reports right lower side pain which has been constant throbbing moderate intensity nonradiating worse with palpation and movement improved with rest.  Denies loss of consciousness, blood thinner use, neck pain, numbness/weakness, tingling, chest pain, abdominal pain, shortness of breath or any additional concerns.   Of note patient reports Tdap updated within the last year. HPI     Past Medical History:  Diagnosis Date  . Anxiety   . CAD (coronary artery disease)   . Cancer (HCC)    basal cell-right nostril  . Fatty liver   . GERD (gastroesophageal reflux disease)   . Heart murmur   . Hemorrhoids   . Hiatal hernia   . History of skin cancer    Right arm  . HTN (hypertension)   . Hyperlipidemia   . Insomnia   . Insomnia   . Pneumonia October 2016  . Rosacea 11/24/2013  . Vitamin D deficiency     Patient Active Problem List   Diagnosis Date Noted  . Basal cell carcinoma (BCC) of skin of nose 04/07/2020  . Dizziness 03/18/2020  . Statin myopathy 06/18/2019  . LSC (lichen simplex chronicus) 05/30/2019  . Dyspnea on exertion 05/29/2019  . Coronary artery calcification 05/29/2019  . Abnormal EKG 05/29/2019  . Osteopenia 03/05/2019  . CAD (coronary artery disease)   . CAP (community acquired pneumonia) 06/27/2015  . Hyponatremia 06/27/2015  . Hypokalemia  06/27/2015  . Thoracic compression fracture ([place]) 01/08/2015  . Pain in thoracic spine 01/08/2015  . Rosacea 11/24/2013  . Vitamin D deficiency 09/29/2013  . GERD (gastroesophageal reflux disease)   . Vitamin D deficiency   . History of skin cancer   . Anxiety   . Mixed hyperlipidemia   . Essential hypertension   . Left groin pain 02/20/2011    Past Surgical History:  Procedure Laterality Date  . APPENDECTOMY    . BASAL CELL CARCINOMA EXCISION     right nostril  . CATARACT EXTRACTION W/PHACO Left 01/25/2016   Procedure: CATARACT EXTRACTION PHACO AND INTRAOCULAR LENS PLACEMENT (IOC);  Surgeon: Birder Robson, MD;  Location: [facility] ORS;  Service: Ophthalmology;  Laterality: Left;  [place] 33.9 AP% 15.5 CDE 5.28 Fluid Pack Lot # [id number]  . CATARACT EXTRACTION W/PHACO Right 02/22/2016   Procedure: CATARACT EXTRACTION PHACO AND INTRAOCULAR LENS PLACEMENT (IOC);  Surgeon: Birder Robson, MD;  Location: [facility] ORS;  Service: Ophthalmology;  Laterality: Right;  [place] 00:40 AP% 23.6 CDE 9.45 fluid pack lot # [id number]  . MOHS SURGERY     bcc right nasal ala 01/2020  . TUBAL LIGATION  1981     OB History   No obstetric history on file.     Family History  Problem Relation Age of Onset  . Heart disease Mother 38       aortic valve- had AVR- died 6 wks after  . Hyperlipidemia Mother   .  Aneurysm Father 32       aortic aneursym  . Heart disease Father   . Heart disease Brother 85       CABG  . Colon cancer Neg Hx   . Stomach cancer Neg Hx     Social History   Tobacco Use  . Smoking status: Former Smoker    Packs/day: 0.25    Years: 22.00    Pack years: 5.50    Types: Cigarettes    Quit date: 1980    Years since quitting: 41.8  . Smokeless tobacco: Never Used  Vaping Use  . Vaping Use: Never used  Substance Use Topics  . Alcohol use: No  . Drug use: No    Home Medications Prior to Admission medications   Medication Sig Start Date End Date Taking? Authorizing Provider    amLODipine (NORVASC) 5 MG tablet Take 1 tablet (5 mg total) by mouth daily. 04/07/20   McLean-Scocuzza, Nino Glow, MD  aspirin 81 MG tablet Take 81 mg by mouth daily.      [provider]  Bempedoic Acid (NEXLETOL) 180 MG TABS Take 1 tablet by mouth daily. 09/19/19   End, Harrell Gave, MD  Biotin 10000 MCG TABS Take 1 tablet by mouth daily.    [provider]  buPROPion (WELLBUTRIN XL) 150 MG 24 hr tablet Take 1 tablet (150 mg total) by mouth daily. 12/02/19   McLean-Scocuzza, Nino Glow, MD  Calcium Carb-Cholecalciferol (CALCIUM+D3 PO) Take 1 tablet by mouth daily.    [provider]  citalopram (CELEXA) 20 MG tablet Take 1 tablet (20 mg total) by mouth daily. 12/02/19   McLean-Scocuzza, Nino Glow, MD  cyanocobalamin 1000 MCG tablet Take 1,000 mcg by mouth daily.    [provider]  Evolocumab (REPATHA SURECLICK) 259 MG/ML SOAJ Inject 1 pen into the skin every 14 (fourteen) days. 06/18/19   End, Harrell Gave, MD  furosemide (LASIX) 20 MG tablet Take 1 tablet (20 mg total) by mouth daily as needed. 04/13/20   McLean-Scocuzza, Nino Glow, MD  lidocaine (LIDODERM) 5 % Place 1 patch onto the skin daily. Remove & Discard patch within 12 hours or as directed by MD 06/11/20   Nuala Alpha A, PA-C  metroNIDAZOLE (METROCREAM) 0.75 % cream Apply topically 2 (two) times daily. 04/07/20   McLean-Scocuzza, Nino Glow, MD  pantoprazole (PROTONIX) 40 MG tablet Take 1 tablet (40 mg total) by mouth daily. 30 minute before food 03/10/20   Crecencio Mc, MD  traZODone (DESYREL) 50 MG tablet Take 1 tablet (50 mg total) by mouth at bedtime as needed for sleep. 12/02/19   McLean-Scocuzza, Nino Glow, MD  VITAMIN D, ERGOCALCIFEROL, PO Take 5,000 Units by mouth daily.    [provider]    Allergies    Ampicillin, Penicillins, Crestor [rosuvastatin calcium], Ezetimibe-simvastatin, Niaspan [niacin er], Vytorin [ezetimibe-simvastatin], Zetia [ezetimibe], Zocor [simvastatin], Fenofibrate, and Welchol  [colesevelam hcl]  Review of Systems   Review of Systems Ten systems are reviewed and are negative for acute change except as noted in the HPI  Physical Exam Updated Vital Signs BP (!) 185/87   Pulse 67   Temp 98.5 F (36.9 C) (Oral)   Resp 18   Ht 5\' 1"  (1.549 m)   Wt 76.8 kg   SpO2 99%   BMI 32.01 kg/m   Physical Exam Constitutional:      General: She is not in acute distress.    Appearance: Normal appearance. She is well-developed. She is not ill-appearing or diaphoretic.  HENT:     Head: Normocephalic. Abrasion and contusion present. No raccoon eyes, Battle's sign or laceration.     Jaw: There is normal jaw occlusion. No trismus.      Comments: 2.5 cm diameter hematoma of the right forehead extending to right eyebrow with overlying superficial abrasion no laceration.    Right Ear: External ear normal.     Left Ear: External ear normal.     Nose: Nose normal.     Mouth/Throat:     Mouth: Mucous membranes are moist.     Pharynx: Oropharynx is clear.  Eyes:     General: Vision grossly intact. Gaze aligned appropriately.     Extraocular Movements: Extraocular movements intact.     Conjunctiva/sclera: Conjunctivae normal.     Pupils: Pupils are equal, round, and reactive to light.     Comments: No pain with extraocular motion  Neck:     Trachea: Trachea and phonation normal. No tracheal tenderness or tracheal deviation.  Cardiovascular:     Rate and Rhythm: Normal rate and regular rhythm.  Pulmonary:     Effort: Pulmonary effort is normal. No accessory muscle usage or respiratory distress.     Breath sounds: Normal air entry.  Chest:     Chest wall: No deformity, tenderness or crepitus.  Abdominal:     General: There is no distension.     Palpations: Abdomen is soft.     Tenderness: There is no abdominal tenderness. There is no guarding or rebound.  Musculoskeletal:        General: Normal range of motion.     Cervical back: Normal range of motion and neck supple.  No spinous process tenderness or muscular tenderness.     Comments: Right lumbar paraspinal muscular tenderness to palpation without overlying skin change pain extends to right side still with no overlying skin changes.  No midline C/T/L spinal tenderness to palpation, no deformity, crepitus, or step-off noted. No sign of injury to the neck or back.  Pelvis stable to compression bilaterally without pain.  Patient able to pull herself up to seated position without assistance or difficulty.  Appropriate intervention and strength at all major joints without deformity or pain.  Skin:    General: Skin is warm and dry.  Neurological:     Mental Status: She is alert.     GCS: GCS eye subscore is 4. GCS verbal subscore is 5. GCS motor subscore is 6.     Comments: Speech is clear and goal oriented, follows commands Major Cranial nerves without deficit, no facial droop Moves extremities without ataxia, coordination intact  Psychiatric:        Behavior: Behavior normal.     ED Results / Procedures / Treatments   Labs (all labs ordered are listed, but only abnormal results are displayed) Labs Reviewed  CBC WITH DIFFERENTIAL/PLATELET - Abnormal; Notable for the following components:      Result Value   WBC 11.4 (*)    Neutro Abs 8.6 (*)    All other components within normal limits  BASIC METABOLIC PANEL - Abnormal; Notable for the following components:   Glucose, Bld 111 (*)    All other components within normal limits  URINALYSIS, ROUTINE W REFLEX MICROSCOPIC - Abnormal; Notable for the following components:   Hgb urine dipstick SMALL (*)    All other components within normal limits    EKG None  Radiology DG Ribs Unilateral W/Chest Right  Result Date: 06/11/2020 CLINICAL DATA:  Right posterior  chest pain after falling today. EXAM: RIGHT RIBS AND CHEST - 3+ VIEW COMPARISON:  Chest radiographs 09/20/2015.  CT 08/06/2015. FINDINGS: The heart size and mediastinal contours are stable aside  from the presence of a small hiatal hernia which appears slightly larger. There is a calcified right upper lobe granuloma. The lungs are otherwise clear. There is no pleural effusion or pneumothorax. The bones appear mildly demineralized. No acute displaced rib fractures are identified. There are degenerative changes in the spine associated with a convex right thoracic scoliosis. Degenerative changes are present at both shoulders. IMPRESSION: 1. No evidence of acute displaced rib fracture, pleural effusion or pneumothorax. 2. Small hiatal hernia. Electronically Signed   By: Richardean Sale M.D.   On: 06/11/2020 10:40   DG Pelvis 1-2 Views  Result Date: 06/11/2020 CLINICAL DATA:  Fall today.  Right chest wall and pelvic pain. EXAM: PELVIS - 1-2 VIEW COMPARISON:  Pelvic CT 04/08/2010. FINDINGS: No evidence of acute fracture. The symphysis pubis and sacroiliac joints are intact with mild degenerative changes. There is moderate lower lumbar spondylosis. The hip joint spaces are preserved. Grossly stable spurring of both greater trochanters. No focal soft tissue abnormalities are identified. IMPRESSION: No acute osseous findings. Lower lumbar spondylosis and mild degenerative changes as described. Electronically Signed   By: Richardean Sale M.D.   On: 06/11/2020 10:41   CT Head Wo Contrast  Result Date: 06/11/2020 CLINICAL DATA:  Facial trauma, head trauma, minor. Facial trauma. Additional history provided: Patient reportedly fell from bed striking forehead on marble table, swelling and right periorbital ecchymosis. EXAM: CT HEAD WITHOUT CONTRAST CT MAXILLOFACIAL WITHOUT CONTRAST CT CERVICAL SPINE WITHOUT CONTRAST TECHNIQUE: Multidetector CT imaging of the head, cervical spine, and maxillofacial structures were performed using the standard protocol without intravenous contrast. Multiplanar CT image reconstructions of the cervical spine and maxillofacial structures were also generated. COMPARISON:  Head CT  02/25/2012. FINDINGS: CT HEAD FINDINGS Brain: Mild generalized cerebral atrophy. Mild ill-defined hypoattenuation within the cerebral white matter is nonspecific, but consistent with chronic small vessel ischemic disease. Incidentally noted 7 mm lobular partially calcified dural-based extra-axial focus overlying the posterior right frontal lobe consistent with small meningioma (series 5, image 42). There is no acute intracranial hemorrhage. No demarcated cortical infarct. No extra-axial fluid collection. No midline shift. Vascular: No hyperdense vessel.  Atherosclerotic calcifications Skull: Normal. Negative for fracture or focal lesion. Other: Sizable right anterior scalp hematoma extending to the periorbital region. No significant mastoid effusion. CT MAXILLOFACIAL FINDINGS Osseous: No acute maxillofacial fracture is identified. Orbits: The globes are normal in size and contour. The extraocular muscles and optic nerve sheath complexes are symmetric and unremarkable. Sinuses: Tiny focus of polypoid mucosal thickening versus mucous retention cyst within the left maxillary sinus. Soft tissues: Prominent right anterior scalp hematoma extending to the periorbital region. CT CERVICAL SPINE FINDINGS Alignment: Cervicothoracic dextrocurvature. Straightening of the expected cervical lordosis. Trace C7-T1 grade 1 anterolisthesis. Skull base and vertebrae: The basion-dental and atlanto-dental intervals are maintained.No evidence of acute fracture to the cervical spine. Soft tissues and spinal canal: No prevertebral fluid or swelling. No visible canal hematoma. Disc levels: Cervical spondylosis with multilevel disc space narrowing, posterior disc osteophytes, uncovertebral hypertrophy and facet arthrosis. A somewhat prominent posterior disc osteophyte complex at C6-C7 contributes to apparent mild/moderate spinal canal stenosis. Multilevel bony neural foraminal narrowing. Upper chest: No consolidation within the imaged lung  apices. No visible pneumothorax. IMPRESSION: CT head: 1. No acute posttraumatic intracranial findings. 2. Prominent right anterior scalp hematoma extending  to the right periorbital region. 3. Incidentally noted 7 mm partially calcified meningioma overlying the right frontal lobe. 4. Mild generalized cerebral atrophy and chronic small vessel ischemic disease. CT maxillofacial: 1. No evidence of acute maxillofacial fracture. 2. Prominent right anterior scalp hematoma extending to the right periorbital region. CT cervical spine: 1. No evidence of acute fracture to the cervical spine. 2. Cervicothoracic dextrocurvature. 3. Minimal C7-T1 grade 1 anterolisthesis. 4. Cervical spondylosis as described. Of note, a C6-C7 posterior disc osteophyte complex contributes to mild/moderate spinal canal stenosis. Multilevel bony neural foraminal narrowing. Electronically Signed   By: Kellie Simmering DO   On: 06/11/2020 11:13   CT Chest W Contrast  Result Date: 06/11/2020 CLINICAL DATA:  Patient fell out of bed striking her right side of her head, as well as injuring her shoulder and right lower back. EXAM: CT CHEST, ABDOMEN, AND PELVIS WITH CONTRAST CT OF THE LUMBAR SPINE WITHOUT CONTRAST. TECHNIQUE: Multidetector CT imaging of the chest, abdomen and pelvis was performed following the standard protocol during bolus administration of intravenous contrast. CONTRAST:  174mL OMNIPAQUE IOHEXOL 300 MG/ML  SOLN COMPARISON:  Chest CT, 08/06/2015. Abdomen and pelvis CT, 04/08/2010. FINDINGS: CT CHEST FINDINGS Cardiovascular: Heart is normal in size. No pericardial effusion. Three-vessel coronary artery calcifications. Enlarged main pulmonary arteries, 2.9 cm. Aorta normal in caliber. Mild aortic atherosclerosis. Mediastinum/Nodes: No mediastinal hematoma. No neck base, axillary, mediastinal or hilar masses. No enlarged lymph nodes. There is a stable anterior mediastinal lymph node, anterior to the heart, 7 mm short axis. Trachea and  esophagus are unremarkable. Moderate size hiatal hernia. Lungs/Pleura: No lung contusion or laceration. No evidence of infection or pulmonary edema. Are are areas of mild peripheral interstitial thickening, most evident on the right. Mild bronchiectasis with focal peribronchial opacity is noted in the right middle lobe and, to lesser degree, left upper lobe lingula, inferomedially. Calcified granuloma in the posterior, subpleural right upper lobe. No pleural effusion or pneumothorax. Musculoskeletal: No acute fracture or acute finding. Chronic mild compression fracture noted T9 stable from the prior chest CT. No osteoblastic or osteolytic lesions. No chest wall mass or contusion. CT ABDOMEN PELVIS FINDINGS Hepatobiliary: Liver normal in size. No contusion or laceration. No masses. Normal gallbladder. No bile duct dilation. Pancreas: No contusion or laceration.  No mass or inflammation. Spleen: Normal in size.  No mass.  No contusion or laceration. Adrenals/Urinary Tract: No adrenal mass or hemorrhage. Kidneys normal in overall size, orientation and position with symmetric enhancement and excretion. No contusion or laceration. No mass, stone or hydronephrosis. Normal ureters. Normal bladder. Stomach/Bowel: Moderate hiatal hernia. Stomach otherwise unremarkable. Bowel is normal in caliber. No wall thickening. No evidence of injury or inflammation. The mesentery is unremarkable. Vascular/Lymphatic: No vascular injury. Aortic atherosclerosis. No aneurysm. Prominent gastrohepatic ligament lymph nodes, largest 11 mm in short axis, unchanged compared to the prior CT. No other adenopathy. Reproductive: Small right inferior subcutaneous perineal cyst, 1.3 cm, incidental. Uterus and adnexa are unremarkable. Other: No abdominal wall contusion or hernia. No ascites or hemoperitoneum. Musculoskeletal: Chronic mild compression fracture of L3. No acute fractures no osteoblastic or osteolytic lesions. CT LUMBAR SPINE FINDINGS  Alignment: Grade 1 anterolisthesis of L4 on L5. Mild curvature of the lower thoracic spine, convex to the right. Osseous structures: Chronic mild compression fracture of L3. No acute fractures. No osteoblastic or osteolytic lesions. Disc levels: T12-L1. Mild diffuse spondylotic disc bulging. Moderate loss of disc height with endplate sclerosis. No evidence of a disc herniation significant central stenosis.  Mild right neural foraminal narrowing suggested. L1-L2. Moderate loss of disc height. Mild endplate spurring and diffuse spondylotic disc bulging. No disc herniation. No significant stenosis. L2-L3. Mild disc bulging. Mild facet degenerative change. No convincing disc herniation or significant stenosis. L3-L4. Mild diffuse disc bulging. Moderate bilateral facet degenerative change. Disc bulging and facet degenerative change causes mild narrowing of the central spinal canal AP diameter approximately 9 mm. Neural foramina are well preserved. L4-L5. Moderate loss of disc height with marked bilateral facet degenerative change. This along with the anterolisthesis leads to least moderate central spinal stenosis and lateral recess narrowing. Neural foramina are relatively well preserved. No convincing disc herniation. L5-S1. Moderate to marked loss of disc height with diffuse endplate sclerosis and spurring and spondylotic disc bulging and moderate to severe facet degenerative change. Combination findings leads to moderate to severe bilateral neural foraminal narrowing. IMPRESSION: CT CHEST ABDOMEN AND PELVIS FINDINGS 1. No acute injury to the chest, abdomen or pelvis. No acute findings. CT LUMBAR SPINE FINDINGS 1. No acute fracture or acute finding. 2. Degenerative changes as detailed. Electronically Signed   By: Lajean Manes M.D.   On: 06/11/2020 14:19   CT Cervical Spine Wo Contrast  Result Date: 06/11/2020 CLINICAL DATA:  Facial trauma, head trauma, minor. Facial trauma. Additional history provided: Patient  reportedly fell from bed striking forehead on marble table, swelling and right periorbital ecchymosis. EXAM: CT HEAD WITHOUT CONTRAST CT MAXILLOFACIAL WITHOUT CONTRAST CT CERVICAL SPINE WITHOUT CONTRAST TECHNIQUE: Multidetector CT imaging of the head, cervical spine, and maxillofacial structures were performed using the standard protocol without intravenous contrast. Multiplanar CT image reconstructions of the cervical spine and maxillofacial structures were also generated. COMPARISON:  Head CT 02/25/2012. FINDINGS: CT HEAD FINDINGS Brain: Mild generalized cerebral atrophy. Mild ill-defined hypoattenuation within the cerebral white matter is nonspecific, but consistent with chronic small vessel ischemic disease. Incidentally noted 7 mm lobular partially calcified dural-based extra-axial focus overlying the posterior right frontal lobe consistent with small meningioma (series 5, image 42). There is no acute intracranial hemorrhage. No demarcated cortical infarct. No extra-axial fluid collection. No midline shift. Vascular: No hyperdense vessel.  Atherosclerotic calcifications Skull: Normal. Negative for fracture or focal lesion. Other: Sizable right anterior scalp hematoma extending to the periorbital region. No significant mastoid effusion. CT MAXILLOFACIAL FINDINGS Osseous: No acute maxillofacial fracture is identified. Orbits: The globes are normal in size and contour. The extraocular muscles and optic nerve sheath complexes are symmetric and unremarkable. Sinuses: Tiny focus of polypoid mucosal thickening versus mucous retention cyst within the left maxillary sinus. Soft tissues: Prominent right anterior scalp hematoma extending to the periorbital region. CT CERVICAL SPINE FINDINGS Alignment: Cervicothoracic dextrocurvature. Straightening of the expected cervical lordosis. Trace C7-T1 grade 1 anterolisthesis. Skull base and vertebrae: The basion-dental and atlanto-dental intervals are maintained.No evidence of  acute fracture to the cervical spine. Soft tissues and spinal canal: No prevertebral fluid or swelling. No visible canal hematoma. Disc levels: Cervical spondylosis with multilevel disc space narrowing, posterior disc osteophytes, uncovertebral hypertrophy and facet arthrosis. A somewhat prominent posterior disc osteophyte complex at C6-C7 contributes to apparent mild/moderate spinal canal stenosis. Multilevel bony neural foraminal narrowing. Upper chest: No consolidation within the imaged lung apices. No visible pneumothorax. IMPRESSION: CT head: 1. No acute posttraumatic intracranial findings. 2. Prominent right anterior scalp hematoma extending to the right periorbital region. 3. Incidentally noted 7 mm partially calcified meningioma overlying the right frontal lobe. 4. Mild generalized cerebral atrophy and chronic small vessel ischemic disease. CT maxillofacial: 1.  No evidence of acute maxillofacial fracture. 2. Prominent right anterior scalp hematoma extending to the right periorbital region. CT cervical spine: 1. No evidence of acute fracture to the cervical spine. 2. Cervicothoracic dextrocurvature. 3. Minimal C7-T1 grade 1 anterolisthesis. 4. Cervical spondylosis as described. Of note, a C6-C7 posterior disc osteophyte complex contributes to mild/moderate spinal canal stenosis. Multilevel bony neural foraminal narrowing. Electronically Signed   By: Kellie Simmering DO   On: 06/11/2020 11:13   CT ABDOMEN PELVIS W CONTRAST  Result Date: 06/11/2020 CLINICAL DATA:  Patient fell out of bed striking her right side of her head, as well as injuring her shoulder and right lower back. EXAM: CT CHEST, ABDOMEN, AND PELVIS WITH CONTRAST CT OF THE LUMBAR SPINE WITHOUT CONTRAST. TECHNIQUE: Multidetector CT imaging of the chest, abdomen and pelvis was performed following the standard protocol during bolus administration of intravenous contrast. CONTRAST:  118mL OMNIPAQUE IOHEXOL 300 MG/ML  SOLN COMPARISON:  Chest CT,  08/06/2015. Abdomen and pelvis CT, 04/08/2010. FINDINGS: CT CHEST FINDINGS Cardiovascular: Heart is normal in size. No pericardial effusion. Three-vessel coronary artery calcifications. Enlarged main pulmonary arteries, 2.9 cm. Aorta normal in caliber. Mild aortic atherosclerosis. Mediastinum/Nodes: No mediastinal hematoma. No neck base, axillary, mediastinal or hilar masses. No enlarged lymph nodes. There is a stable anterior mediastinal lymph node, anterior to the heart, 7 mm short axis. Trachea and esophagus are unremarkable. Moderate size hiatal hernia. Lungs/Pleura: No lung contusion or laceration. No evidence of infection or pulmonary edema. Are are areas of mild peripheral interstitial thickening, most evident on the right. Mild bronchiectasis with focal peribronchial opacity is noted in the right middle lobe and, to lesser degree, left upper lobe lingula, inferomedially. Calcified granuloma in the posterior, subpleural right upper lobe. No pleural effusion or pneumothorax. Musculoskeletal: No acute fracture or acute finding. Chronic mild compression fracture noted T9 stable from the prior chest CT. No osteoblastic or osteolytic lesions. No chest wall mass or contusion. CT ABDOMEN PELVIS FINDINGS Hepatobiliary: Liver normal in size. No contusion or laceration. No masses. Normal gallbladder. No bile duct dilation. Pancreas: No contusion or laceration.  No mass or inflammation. Spleen: Normal in size.  No mass.  No contusion or laceration. Adrenals/Urinary Tract: No adrenal mass or hemorrhage. Kidneys normal in overall size, orientation and position with symmetric enhancement and excretion. No contusion or laceration. No mass, stone or hydronephrosis. Normal ureters. Normal bladder. Stomach/Bowel: Moderate hiatal hernia. Stomach otherwise unremarkable. Bowel is normal in caliber. No wall thickening. No evidence of injury or inflammation. The mesentery is unremarkable. Vascular/Lymphatic: No vascular injury.  Aortic atherosclerosis. No aneurysm. Prominent gastrohepatic ligament lymph nodes, largest 11 mm in short axis, unchanged compared to the prior CT. No other adenopathy. Reproductive: Small right inferior subcutaneous perineal cyst, 1.3 cm, incidental. Uterus and adnexa are unremarkable. Other: No abdominal wall contusion or hernia. No ascites or hemoperitoneum. Musculoskeletal: Chronic mild compression fracture of L3. No acute fractures no osteoblastic or osteolytic lesions. CT LUMBAR SPINE FINDINGS Alignment: Grade 1 anterolisthesis of L4 on L5. Mild curvature of the lower thoracic spine, convex to the right. Osseous structures: Chronic mild compression fracture of L3. No acute fractures. No osteoblastic or osteolytic lesions. Disc levels: T12-L1. Mild diffuse spondylotic disc bulging. Moderate loss of disc height with endplate sclerosis. No evidence of a disc herniation significant central stenosis. Mild right neural foraminal narrowing suggested. L1-L2. Moderate loss of disc height. Mild endplate spurring and diffuse spondylotic disc bulging. No disc herniation. No significant stenosis. L2-L3. Mild disc bulging. Mild  facet degenerative change. No convincing disc herniation or significant stenosis. L3-L4. Mild diffuse disc bulging. Moderate bilateral facet degenerative change. Disc bulging and facet degenerative change causes mild narrowing of the central spinal canal AP diameter approximately 9 mm. Neural foramina are well preserved. L4-L5. Moderate loss of disc height with marked bilateral facet degenerative change. This along with the anterolisthesis leads to least moderate central spinal stenosis and lateral recess narrowing. Neural foramina are relatively well preserved. No convincing disc herniation. L5-S1. Moderate to marked loss of disc height with diffuse endplate sclerosis and spurring and spondylotic disc bulging and moderate to severe facet degenerative change. Combination findings leads to moderate to  severe bilateral neural foraminal narrowing. IMPRESSION: CT CHEST ABDOMEN AND PELVIS FINDINGS 1. No acute injury to the chest, abdomen or pelvis. No acute findings. CT LUMBAR SPINE FINDINGS 1. No acute fracture or acute finding. 2. Degenerative changes as detailed. Electronically Signed   By: Lajean Manes M.D.   On: 06/11/2020 14:19   CT L-SPINE NO CHARGE  Result Date: 06/11/2020 CLINICAL DATA:  Patient fell out of bed striking her right side of her head, as well as injuring her shoulder and right lower back. EXAM: CT CHEST, ABDOMEN, AND PELVIS WITH CONTRAST CT OF THE LUMBAR SPINE WITHOUT CONTRAST. TECHNIQUE: Multidetector CT imaging of the chest, abdomen and pelvis was performed following the standard protocol during bolus administration of intravenous contrast. CONTRAST:  159mL OMNIPAQUE IOHEXOL 300 MG/ML  SOLN COMPARISON:  Chest CT, 08/06/2015. Abdomen and pelvis CT, 04/08/2010. FINDINGS: CT CHEST FINDINGS Cardiovascular: Heart is normal in size. No pericardial effusion. Three-vessel coronary artery calcifications. Enlarged main pulmonary arteries, 2.9 cm. Aorta normal in caliber. Mild aortic atherosclerosis. Mediastinum/Nodes: No mediastinal hematoma. No neck base, axillary, mediastinal or hilar masses. No enlarged lymph nodes. There is a stable anterior mediastinal lymph node, anterior to the heart, 7 mm short axis. Trachea and esophagus are unremarkable. Moderate size hiatal hernia. Lungs/Pleura: No lung contusion or laceration. No evidence of infection or pulmonary edema. Are are areas of mild peripheral interstitial thickening, most evident on the right. Mild bronchiectasis with focal peribronchial opacity is noted in the right middle lobe and, to lesser degree, left upper lobe lingula, inferomedially. Calcified granuloma in the posterior, subpleural right upper lobe. No pleural effusion or pneumothorax. Musculoskeletal: No acute fracture or acute finding. Chronic mild compression fracture noted T9  stable from the prior chest CT. No osteoblastic or osteolytic lesions. No chest wall mass or contusion. CT ABDOMEN PELVIS FINDINGS Hepatobiliary: Liver normal in size. No contusion or laceration. No masses. Normal gallbladder. No bile duct dilation. Pancreas: No contusion or laceration.  No mass or inflammation. Spleen: Normal in size.  No mass.  No contusion or laceration. Adrenals/Urinary Tract: No adrenal mass or hemorrhage. Kidneys normal in overall size, orientation and position with symmetric enhancement and excretion. No contusion or laceration. No mass, stone or hydronephrosis. Normal ureters. Normal bladder. Stomach/Bowel: Moderate hiatal hernia. Stomach otherwise unremarkable. Bowel is normal in caliber. No wall thickening. No evidence of injury or inflammation. The mesentery is unremarkable. Vascular/Lymphatic: No vascular injury. Aortic atherosclerosis. No aneurysm. Prominent gastrohepatic ligament lymph nodes, largest 11 mm in short axis, unchanged compared to the prior CT. No other adenopathy. Reproductive: Small right inferior subcutaneous perineal cyst, 1.3 cm, incidental. Uterus and adnexa are unremarkable. Other: No abdominal wall contusion or hernia. No ascites or hemoperitoneum. Musculoskeletal: Chronic mild compression fracture of L3. No acute fractures no osteoblastic or osteolytic lesions. CT LUMBAR SPINE FINDINGS Alignment: Grade  1 anterolisthesis of L4 on L5. Mild curvature of the lower thoracic spine, convex to the right. Osseous structures: Chronic mild compression fracture of L3. No acute fractures. No osteoblastic or osteolytic lesions. Disc levels: T12-L1. Mild diffuse spondylotic disc bulging. Moderate loss of disc height with endplate sclerosis. No evidence of a disc herniation significant central stenosis. Mild right neural foraminal narrowing suggested. L1-L2. Moderate loss of disc height. Mild endplate spurring and diffuse spondylotic disc bulging. No disc herniation. No  significant stenosis. L2-L3. Mild disc bulging. Mild facet degenerative change. No convincing disc herniation or significant stenosis. L3-L4. Mild diffuse disc bulging. Moderate bilateral facet degenerative change. Disc bulging and facet degenerative change causes mild narrowing of the central spinal canal AP diameter approximately 9 mm. Neural foramina are well preserved. L4-L5. Moderate loss of disc height with marked bilateral facet degenerative change. This along with the anterolisthesis leads to least moderate central spinal stenosis and lateral recess narrowing. Neural foramina are relatively well preserved. No convincing disc herniation. L5-S1. Moderate to marked loss of disc height with diffuse endplate sclerosis and spurring and spondylotic disc bulging and moderate to severe facet degenerative change. Combination findings leads to moderate to severe bilateral neural foraminal narrowing. IMPRESSION: CT CHEST ABDOMEN AND PELVIS FINDINGS 1. No acute injury to the chest, abdomen or pelvis. No acute findings. CT LUMBAR SPINE FINDINGS 1. No acute fracture or acute finding. 2. Degenerative changes as detailed. Electronically Signed   By: Lajean Manes M.D.   On: 06/11/2020 14:19   CT Maxillofacial Wo Contrast  Result Date: 06/11/2020 CLINICAL DATA:  Facial trauma, head trauma, minor. Facial trauma. Additional history provided: Patient reportedly fell from bed striking forehead on marble table, swelling and right periorbital ecchymosis. EXAM: CT HEAD WITHOUT CONTRAST CT MAXILLOFACIAL WITHOUT CONTRAST CT CERVICAL SPINE WITHOUT CONTRAST TECHNIQUE: Multidetector CT imaging of the head, cervical spine, and maxillofacial structures were performed using the standard protocol without intravenous contrast. Multiplanar CT image reconstructions of the cervical spine and maxillofacial structures were also generated. COMPARISON:  Head CT 02/25/2012. FINDINGS: CT HEAD FINDINGS Brain: Mild generalized cerebral atrophy.  Mild ill-defined hypoattenuation within the cerebral white matter is nonspecific, but consistent with chronic small vessel ischemic disease. Incidentally noted 7 mm lobular partially calcified dural-based extra-axial focus overlying the posterior right frontal lobe consistent with small meningioma (series 5, image 42). There is no acute intracranial hemorrhage. No demarcated cortical infarct. No extra-axial fluid collection. No midline shift. Vascular: No hyperdense vessel.  Atherosclerotic calcifications Skull: Normal. Negative for fracture or focal lesion. Other: Sizable right anterior scalp hematoma extending to the periorbital region. No significant mastoid effusion. CT MAXILLOFACIAL FINDINGS Osseous: No acute maxillofacial fracture is identified. Orbits: The globes are normal in size and contour. The extraocular muscles and optic nerve sheath complexes are symmetric and unremarkable. Sinuses: Tiny focus of polypoid mucosal thickening versus mucous retention cyst within the left maxillary sinus. Soft tissues: Prominent right anterior scalp hematoma extending to the periorbital region. CT CERVICAL SPINE FINDINGS Alignment: Cervicothoracic dextrocurvature. Straightening of the expected cervical lordosis. Trace C7-T1 grade 1 anterolisthesis. Skull base and vertebrae: The basion-dental and atlanto-dental intervals are maintained.No evidence of acute fracture to the cervical spine. Soft tissues and spinal canal: No prevertebral fluid or swelling. No visible canal hematoma. Disc levels: Cervical spondylosis with multilevel disc space narrowing, posterior disc osteophytes, uncovertebral hypertrophy and facet arthrosis. A somewhat prominent posterior disc osteophyte complex at C6-C7 contributes to apparent mild/moderate spinal canal stenosis. Multilevel bony neural foraminal narrowing. Upper chest: No  consolidation within the imaged lung apices. No visible pneumothorax. IMPRESSION: CT head: 1. No acute posttraumatic  intracranial findings. 2. Prominent right anterior scalp hematoma extending to the right periorbital region. 3. Incidentally noted 7 mm partially calcified meningioma overlying the right frontal lobe. 4. Mild generalized cerebral atrophy and chronic small vessel ischemic disease. CT maxillofacial: 1. No evidence of acute maxillofacial fracture. 2. Prominent right anterior scalp hematoma extending to the right periorbital region. CT cervical spine: 1. No evidence of acute fracture to the cervical spine. 2. Cervicothoracic dextrocurvature. 3. Minimal C7-T1 grade 1 anterolisthesis. 4. Cervical spondylosis as described. Of note, a C6-C7 posterior disc osteophyte complex contributes to mild/moderate spinal canal stenosis. Multilevel bony neural foraminal narrowing. Electronically Signed   By: Kellie Simmering DO   On: 06/11/2020 11:13    Procedures Procedures (including critical care time)  Medications Ordered in ED Medications  morphine 2 MG/ML injection 2 mg (2 mg Intravenous Given 06/11/20 1104)  lidocaine-EPINEPHrine-tetracaine (LET) topical gel (3 mLs Topical Given 06/11/20 1144)  iohexol (OMNIPAQUE) 300 MG/ML solution 100 mL (100 mLs Intravenous Contrast Given 06/11/20 1354)  fentaNYL (SUBLIMAZE) injection 50 mcg (50 mcg Intravenous Given 06/11/20 1453)    ED Course  I have reviewed the triage vital signs and the nursing notes.  Pertinent labs & imaging results that were available during my care of the patient were reviewed by me and considered in my medical decision making (see chart for details).    MDM Rules/Calculators/A&P                         Additional history obtained from: 1. Nursing notes from this visit. ------------------------- CBC shows mild leukocytosis of 11.4, suspect secondary to pain and injury today, no anemia. BMP shows no emergent electrolyte derangement, AKI or gap. Urinalysis shows small hemoglobin otherwise within normal limits, no evidence of infection.  DG Chest  w/ Right Ribs:  IMPRESSION:  1. No evidence of acute displaced rib fracture, pleural effusion or  pneumothorax.  2. Small hiatal hernia.   DG pelvis:  IMPRESSION:  No acute osseous findings. Lower lumbar spondylosis and mild  degenerative changes as described.   CT Head/Max Face/Cspine:  IMPRESSION:  CT head:    1. No acute posttraumatic intracranial findings.  2. Prominent right anterior scalp hematoma extending to the right  periorbital region.  3. Incidentally noted 7 mm partially calcified meningioma overlying  the right frontal lobe.  4. Mild generalized cerebral atrophy and chronic small vessel  ischemic disease.    CT maxillofacial:    1. No evidence of acute maxillofacial fracture.  2. Prominent right anterior scalp hematoma extending to the right  periorbital region.    CT cervical spine:    1. No evidence of acute fracture to the cervical spine.  2. Cervicothoracic dextrocurvature.  3. Minimal C7-T1 grade 1 anterolisthesis.  4. Cervical spondylosis as described. Of note, a C6-C7 posterior  disc osteophyte complex contributes to mild/moderate spinal canal  stenosis. Multilevel bony neural foraminal narrowing.   CT Chest/AP/Lumbar:  IMPRESSION:  CT CHEST ABDOMEN AND PELVIS FINDINGS    1. No acute injury to the chest, abdomen or pelvis. No acute  findings.    CT LUMBAR SPINE FINDINGS    1. No acute fracture or acute finding.  2. Degenerative changes as detailed.  ------------------------------------ Trauma work-up today is reassuring.  Incidental findings above were discussed with patient and her daughter who at bedside they state understanding and will  follow up with PCP for further work-up.  The hematoma to her right forehead will need be treated with ice, there is no indication for repair she is a small abrasion only.  There is no evidence of ocular involvement, EOMI intact without pain, normal conjunctival and sclera and patient denies changes in  vision.  Tdap up-to-date per patient onto the last year.  Patient's right low back and side pain is likely muscular secondary to her fall no evidence of fracture or internal injuries, no external evidence of injury.  It is reproducible on exam we will treat with Lidoderm patches and OTC anti-inflammatories.  She has good range of motion and strength with all 4 extremities and no pain with movement of any of the major joints I ambulated the patient in the room with normal assistance.  She feels safe going home, her son lives with her and helps her with daily tasks normally.  At this time there does not appear to be any evidence of an acute emergency medical condition and the patient appears stable for discharge with appropriate outpatient follow up. Diagnosis was discussed with patient who verbalizes understanding of care plan and is agreeable to discharge. I have discussed return precautions with patient who verbalizes understanding. Patient encouraged to follow-up with their PCP. All questions answered.  Patient seen and evaluated by Dr. Rogene Houston during this visit who agrees with discharge.  Note: Portions of this report may have been transcribed using voice recognition software. Every effort was made to ensure accuracy; however, inadvertent computerized transcription errors may still be present. Final Clinical Impression(s) / ED Diagnoses Final diagnoses:  Fall  Injury of head, initial encounter  Acute right-sided low back pain without sciatica    Rx / DC Orders ED Discharge Orders         Ordered    lidocaine (LIDODERM) 5 %  Every 24 hours        06/11/20 1633           Gari Crown 06/11/20 1636    Fredia Sorrow, MD 06/16/20 1018

## 2020-06-11 NOTE — Discharge Instructions (Addendum)
At this time there does not appear to be the presence of an emergent medical condition, however there is always the potential for conditions to change. Please read and follow the below instructions.  Please return to the Emergency Department immediately for any new or worsening symptoms. Please be sure to follow up with your Primary Care Provider within one week regarding your visit today; please call their office to schedule an appointment even if you are feeling better for a follow-up visit. Your CT scan today showed incidental findings including a 7 mm partially calcified meningioma overlying your right frontal lobe of your brain.  It also showed mild generalized cerebral atrophy and chronic small vessel ischemic changes of the brain.  It also showed cervical spondylosis and spinal canal stenosis of your neck as well as multilevel bony neural foraminal narrowing.  Your spine also showed degenerative changes.  There was also a small hiatal hernia on your x-ray.  There were calcifications of your arteries and enlarged pulmonary arteries today.  There was a mediastinal lymph node seen as well.  Bronchiectasis of the lungs was visualized on your CT scan as well as a calcified granuloma.  You may review your CT scan findings in their entirety on your MyChart account and please discuss them with your primary care provider at your follow-up visit. Your urine today showed some hemoglobin, please discuss this with your primary care provider at your follow-up visit. You may use the Lidoderm patch as prescribed to help with your symptoms.  Lidoderm may be expensive so you may speak with your pharmacist about finding over-the-counter medications that work similarly such as Chief Operating Officer.  Go to the nearest Emergency Department immediately if: You have fever or chills You have: A very bad headache that is not helped by medicine. Trouble walking or weakness in your arms and legs. Clear or bloody fluid coming from your  nose or ears. Changes in how you see (vision). Shaking movements that you cannot control. You lose your balance. You vomit. The black centers of your eyes (pupils) change in size. Your speech is slurred. Your dizziness gets worse. You pass out. You are sleepier than normal and have trouble staying awake. You have any new/concerning or worsening of symptoms  Please read the additional information packets attached to your discharge summary.  Do not take your medicine if  develop an itchy rash, swelling in your mouth or lips, or difficulty breathing; call 911 and seek immediate emergency medical attention if this occurs.  You may review your lab tests and imaging results in their entirety on your MyChart account.  Please discuss all results of fully with your primary care provider and other specialist at your follow-up visit.  Note: Portions of this text may have been transcribed using voice recognition software. Every effort was made to ensure accuracy; however, inadvertent computerized transcription errors may still be present.

## 2020-06-11 NOTE — ED Notes (Signed)
avs reviewed with patient patient wheeled out of department with daughter.

## 2020-06-11 NOTE — ED Triage Notes (Signed)
Pt reports rolling over in bed and falling out, striking the R side of her head on a marble top side table. Also c/o a "knot" to shoulder and pain to lower R back. Pt moves all limbs equally, a/ox4, speech clear, nad, laceration to R head, bleeding controlled with pressure bandage. Pt denies LOC or anticoagulants.

## 2020-06-25 NOTE — Telephone Encounter (Signed)
Samples picked up

## 2020-08-02 ENCOUNTER — Telehealth: Payer: Self-pay | Admitting: Pharmacist

## 2020-08-02 NOTE — Telephone Encounter (Signed)
PA approved through 08/27/2021. 

## 2020-08-02 NOTE — Telephone Encounter (Signed)
PA for nexletol submitted.  Key: [id number]

## 2020-09-08 ENCOUNTER — Other Ambulatory Visit: Payer: Self-pay | Admitting: Internal Medicine

## 2020-09-08 NOTE — Telephone Encounter (Signed)
Pt will contact Dr. Aundra Dubin office to have fenofibrate filled. Pt will give [place] a call to verify medication and if she needs refill. Pt mentioned she take fenofibrate 48 mg qd.

## 2020-09-09 ENCOUNTER — Telehealth: Payer: Self-pay | Admitting: Internal Medicine

## 2020-09-09 ENCOUNTER — Other Ambulatory Visit: Payer: Self-pay | Admitting: Internal Medicine

## 2020-09-09 ENCOUNTER — Other Ambulatory Visit: Payer: Self-pay

## 2020-09-09 ENCOUNTER — Telehealth: Payer: Self-pay

## 2020-09-09 DIAGNOSIS — K449 Diaphragmatic hernia without obstruction or gangrene: Secondary | ICD-10-CM

## 2020-09-09 DIAGNOSIS — K219 Gastro-esophageal reflux disease without esophagitis: Secondary | ICD-10-CM

## 2020-09-09 DIAGNOSIS — F419 Anxiety disorder, unspecified: Secondary | ICD-10-CM

## 2020-09-09 DIAGNOSIS — F32A Depression, unspecified: Secondary | ICD-10-CM

## 2020-09-09 MED ORDER — CITALOPRAM HYDROBROMIDE 20 MG PO TABS: 20.0000 mg | ORAL_TABLET | Freq: Every day | ORAL | 3 refills | Status: DC

## 2020-09-09 MED ORDER — PANTOPRAZOLE SODIUM 40 MG PO TBEC: 40.0000 mg | DELAYED_RELEASE_TABLET | Freq: Two times a day (BID) | ORAL | 0 refills | Status: DC

## 2020-09-09 NOTE — Telephone Encounter (Signed)
I rec Gi referral does she want one?

## 2020-09-09 NOTE — Telephone Encounter (Signed)
Patient called in stated that the pantoprazole (PROTONIX) 40 MG tablet is not working need something else

## 2020-09-09 NOTE — Telephone Encounter (Signed)
I rec Gi referral does she want one? Increased protonix to 2x per day 30 min before food

## 2020-09-09 NOTE — Telephone Encounter (Signed)
Called and spoke to Sharon Mcclure. Sharon Mcclure states that her Protonix has been gradually not working and today she states that it has stopped working. She states that she has been experiencing acid reflux from eating things such as apples or other fruits. She is currently experiencing heart burn and states that she had previously eaten cheerios with milk, blueberries, and a banana in the morning for breakfast. Sharon Mcclure states that she also feels bloated and has gas and states that her burps leave a burning feeling in the back of her throat. She asks if there is anything else she could do for the Heartburn.

## 2020-09-09 NOTE — Telephone Encounter (Signed)
Patient called back and reported she has already been approved through [facility]

## 2020-09-09 NOTE — Telephone Encounter (Signed)
lmom pt to cll back to get renewed for [employer]

## 2020-09-10 NOTE — Telephone Encounter (Signed)
No answer, no voicemail.

## 2020-09-17 NOTE — Telephone Encounter (Signed)
Patient informed and verbalized understanding.  She is agreeable to the referral

## 2020-09-20 NOTE — Addendum Note (Signed)
Addended by: Orland Mustard on: 09/20/2020 08:30 AM   Modules accepted: Orders

## 2020-09-21 ENCOUNTER — Other Ambulatory Visit: Payer: Self-pay

## 2020-09-21 DIAGNOSIS — R600 Localized edema: Secondary | ICD-10-CM

## 2020-09-21 MED ORDER — FUROSEMIDE 20 MG PO TABS: 20.0000 mg | ORAL_TABLET | Freq: Every day | ORAL | 5 refills | Status: DC | PRN

## 2020-09-22 ENCOUNTER — Encounter: Payer: Self-pay | Admitting: Internal Medicine

## 2020-09-22 ENCOUNTER — Ambulatory Visit: Payer: Medicare Other | Admitting: Internal Medicine

## 2020-09-22 ENCOUNTER — Other Ambulatory Visit: Payer: Self-pay

## 2020-09-22 VITALS — BP 130/80 | HR 63 | Ht 60.0 in | Wt 171.4 lb

## 2020-09-22 DIAGNOSIS — I2510 Atherosclerotic heart disease of native coronary artery without angina pectoris: Secondary | ICD-10-CM

## 2020-09-22 DIAGNOSIS — R42 Dizziness and giddiness: Secondary | ICD-10-CM

## 2020-09-22 DIAGNOSIS — I10 Essential (primary) hypertension: Secondary | ICD-10-CM

## 2020-09-22 DIAGNOSIS — I493 Ventricular premature depolarization: Secondary | ICD-10-CM

## 2020-09-22 DIAGNOSIS — E785 Hyperlipidemia, unspecified: Secondary | ICD-10-CM

## 2020-09-22 NOTE — Progress Notes (Signed)
Follow-up Outpatient Visit Date: 09/22/2020  Primary Care Provider: McLean-Scocuzza, Nino Glow, [place] [id number]  Chief Complaint: Follow-up CAD and hypertension  HPI:  Sharon Mcclure is a 80 y.o. female with history of coronary artery disease (coronary artery calcification previously noted but no ischemia or scar on Myoview in 05/2019), hypertension, hyperlipidemia, hiatal hernia, fatty liver, and anxiety, who presents for follow-up of coronary artery disease and dyspnea.  I last saw her in July, at which time Sharon Mcclure was doing well other than occasional orthostatic lightheadedness as well as dizziness when turning her head.  Chronic exertional dyspnea was unchanged from baseline.  She was seen in the [facility] emergency department in mid October after falling out of bed.  No acute abnormality was identified.  Today, Sharon Mcclure reports that she has been feeling well.  She has stable exertional dyspnea.  Her indigestion was recently worse, though it has improved somewhat with escalation of Protonix to 40 mg BID.  She is no longer on bempidoic acid, as it also caused myalgias.  She is tolerating Repatha well, though she has been rationing her doses somewhat due to cost.  --------------------------------------------------------------------------------------------------  Cardiovascular History & Procedures: Cardiovascular Problems:  Coronary artery calcification  Hyperlipidemia  Chronic dyspnea on exertion  Risk Factors:  Coronary artery calcification, hypertension, hyperlipidemia, obesity, and age greater than 73  Cath/PCI:  None  CV Surgery:  None  EP Procedures and Devices:  None  Non-Invasive Evaluation(s):  TTE (09/03/2019): Normal LV size and wall thickness. LVEF 60-65% with grade 1 diastolic dysfunction and normal wall motion. Normal RV size and function. Mild to moderate left atrial enlargement. Aortic sclerosis without stenosis.  Mild pulmonary hypertension.  Pharmacologic MPI (06/25/2019): Intermediate risk study with calculated LVEF <30% (normal LV function by visual estimation). No evidence of significant ischemia or scar.   Recent CV Pertinent Labs: Lab Results  Component Value Date   CHOL 122 04/07/2020   CHOL 196 09/17/2019   HDL 53.10 04/07/2020   HDL 55 09/17/2019   LDLCALC 45 04/07/2020   LDLCALC 112 (H) 09/17/2019   LDLCALC 203 (H) 05/13/2018   TRIG 123.0 04/07/2020   CHOLHDL 2 04/07/2020   K 4.1 06/11/2020   K 3.6 04/01/2012   MG 1.8 04/30/2019   BUN 18 06/11/2020   BUN 22 (H) 04/01/2012   CREATININE 0.89 06/11/2020   CREATININE 0.77 05/13/2018    Past medical and surgical history were reviewed and updated in EPIC.  Current Meds  Medication Sig  . amLODipine (NORVASC) 5 MG tablet Take 1 tablet (5 mg total) by mouth daily.  Marland Kitchen aspirin 81 MG tablet Take 81 mg by mouth daily.  . Biotin 10000 MCG TABS Take 1 tablet by mouth daily.  Marland Kitchen buPROPion (WELLBUTRIN XL) 150 MG 24 hr tablet Take 1 tablet (150 mg total) by mouth daily.  . Calcium Carb-Cholecalciferol (CALCIUM+D3 PO) Take 1 tablet by mouth daily.  . citalopram (CELEXA) 20 MG tablet Take 1 tablet (20 mg total) by mouth daily.  . cyanocobalamin 1000 MCG tablet Take 1,000 mcg by mouth daily.  . furosemide (LASIX) 20 MG tablet Take 1 tablet (20 mg total) by mouth daily as needed.  . lidocaine (LIDODERM) 5 % Place 1 patch onto the skin daily. Remove & Discard patch within 12 hours or as directed by MD  . metroNIDAZOLE (METROCREAM) 0.75 % cream Apply topically 2 (two) times daily.  . pantoprazole (PROTONIX) 40 MG tablet Take 1 tablet (40 mg  total) by mouth 2 (two) times daily before a meal. 30 minute before food  . REPATHA SURECLICK XX123456 MG/ML SOAJ INJECT 1 PEN INTO THE SKIN EVERY 14 DAYS  . traZODone (DESYREL) 50 MG tablet Take 1 tablet (50 mg total) by mouth at bedtime as needed for sleep.  Marland Kitchen VITAMIN D, ERGOCALCIFEROL, PO Take 5,000 Units by  mouth daily.  . [DISCONTINUED] Bempedoic Acid (NEXLETOL) 180 MG TABS Take 1 tablet by mouth daily.    Allergies: Ampicillin, Penicillins, Crestor [rosuvastatin calcium], Ezetimibe-simvastatin, Niaspan [niacin er], Vytorin [ezetimibe-simvastatin], Zetia [ezetimibe], Zocor [simvastatin], Fenofibrate, and Welchol [colesevelam hcl]  Social History   Tobacco Use  . Smoking status: Former Smoker    Packs/day: 0.25    Years: 22.00    Pack years: 5.50    Types: Cigarettes    Quit date: 1980    Years since quitting: 42.0  . Smokeless tobacco: Never Used  Vaping Use  . Vaping Use: Never used  Substance Use Topics  . Alcohol use: No  . Drug use: No    Family History  Problem Relation Age of Onset  . Heart disease Mother 58       aortic valve- had AVR- died 6 wks after  . Hyperlipidemia Mother   . Aneurysm Father 80       aortic aneursym  . Heart disease Father   . Heart disease Brother 45       CABG  . Colon cancer Neg Hx   . Stomach cancer Neg Hx     Review of Systems: A 12-system review of systems was performed and was negative except as noted in the HPI.  --------------------------------------------------------------------------------------------------  Physical Exam: BP 130/80 (BP Location: Left Arm, Patient Position: Sitting, Cuff Size: Normal)   Pulse 63   Ht 5' (1.524 m)   Wt 171 lb 6 oz (77.7 kg)   SpO2 97%   BMI 33.47 kg/m   General:  NAD. Neck: No JVD or HJR. Lungs: Clear to auscultation bilaterally without wheezes or crackles. Heart: Regular rate and rhythm with 1/6 systolic murmur. Abdomen: Soft, nontender, nondistended. Extremities: No lower extremity edema.  EKG:  NSR with PVC's, low voltage, and poor R wave progression.  PVC is new from 03/17/20.  Otherwise, there is no significant change.  Lab Results  Component Value Date   WBC 11.4 (H) 06/11/2020   HGB 14.3 06/11/2020   HCT 44.2 06/11/2020   MCV 90.4 06/11/2020   PLT 262 06/11/2020    Lab  Results  Component Value Date   NA 139 06/11/2020   K 4.1 06/11/2020   CL 101 06/11/2020   CO2 27 06/11/2020   BUN 18 06/11/2020   CREATININE 0.89 06/11/2020   GLUCOSE 111 (H) 06/11/2020   ALT 19 04/07/2020    Lab Results  Component Value Date   CHOL 122 04/07/2020   HDL 53.10 04/07/2020   LDLCALC 45 04/07/2020   TRIG 123.0 04/07/2020   CHOLHDL 2 04/07/2020    --------------------------------------------------------------------------------------------------  ASSESSMENT AND PLAN: Coronary artery disease: Patient noted to have coronary artery calcification with no ischemia noted on prior MPI.  She has stable exertional dyspnea but otherwise is asymptomatic.  We will continue aspirin and Repatha to prevent progression of disease.  I encouraged her to use Repatha as prescribed (Q2 weeks).  Dizziness: This has resolved.  No further workup at this time.  PVC's: Incidentally noted on EKG; asymptomatic.  Defer further workup/intervention.  Hyperlipidemia: LDL at goal (<70).  Continue Repatha.  Hypertension: Upper normal BP today.  Defer medication changes given dizziness in the past.  Follow-up: RTC in 6 months.  Nelva Bush, MD 09/22/2020 10:48 AM

## 2020-09-22 NOTE — Patient Instructions (Signed)
Medication Instructions:  Your physician recommends that you continue on your current medications as directed. Please refer to the Current Medication list given to you today.  *If you need a refill on your cardiac medications before your next appointment, please call your pharmacy*  Follow-Up: At [facility], you and your health needs are our priority.  As part of our continuing mission to provide you with exceptional heart care, we have created designated Provider Care Teams.  These Care Teams include your primary Cardiologist (physician) and Advanced Practice Providers (APPs -  Physician Assistants and Nurse Practitioners) who all work together to provide you with the care you need, when you need it.  We recommend signing up for the patient portal called "MyChart".  Sign up information is provided on this After Visit Summary.  MyChart is used to connect with patients for Virtual Visits (Telemedicine).  Patients are able to view lab/test results, encounter notes, upcoming appointments, etc.  Non-urgent messages can be sent to your provider as well.   To learn more about what you can do with MyChart, go to [email or web address].    Your next appointment:   6 month(s)  The format for your next appointment:   In Person  Provider:   You may see DR CHRISTOPHER END or one of the following Advanced Practice Providers on your designated Care Team:    Christopher Berge, NP  Ryan Dunn, PA-C  Jacquelyn Visser, PA-C  Cadence Furth, PA-C  Caitlin Walker, NP  

## 2020-09-23 ENCOUNTER — Encounter: Payer: Self-pay | Admitting: Internal Medicine

## 2020-09-23 DIAGNOSIS — I493 Ventricular premature depolarization: Secondary | ICD-10-CM

## 2020-09-23 HISTORY — DX: Ventricular premature depolarization: I49.3

## 2020-10-06 ENCOUNTER — Ambulatory Visit: Payer: Medicare Other | Admitting: Gastroenterology

## 2020-10-09 ENCOUNTER — Other Ambulatory Visit: Payer: Self-pay | Admitting: Internal Medicine

## 2020-10-11 ENCOUNTER — Other Ambulatory Visit: Payer: Self-pay | Admitting: Internal Medicine

## 2020-10-13 ENCOUNTER — Encounter: Payer: Self-pay | Admitting: Internal Medicine

## 2020-10-13 ENCOUNTER — Ambulatory Visit (INDEPENDENT_AMBULATORY_CARE_PROVIDER_SITE_OTHER): Payer: Medicare Other

## 2020-10-13 ENCOUNTER — Other Ambulatory Visit: Payer: Self-pay

## 2020-10-13 ENCOUNTER — Ambulatory Visit (INDEPENDENT_AMBULATORY_CARE_PROVIDER_SITE_OTHER): Payer: Medicare Other | Admitting: Internal Medicine

## 2020-10-13 VITALS — BP 102/66 | HR 70 | Temp 99.2°F | Ht 60.0 in | Wt 170.4 lb

## 2020-10-13 DIAGNOSIS — M546 Pain in thoracic spine: Secondary | ICD-10-CM | POA: Diagnosis not present

## 2020-10-13 DIAGNOSIS — E785 Hyperlipidemia, unspecified: Secondary | ICD-10-CM

## 2020-10-13 DIAGNOSIS — N3281 Overactive bladder: Secondary | ICD-10-CM

## 2020-10-13 DIAGNOSIS — D72829 Elevated white blood cell count, unspecified: Secondary | ICD-10-CM | POA: Diagnosis not present

## 2020-10-13 DIAGNOSIS — F32A Depression, unspecified: Secondary | ICD-10-CM

## 2020-10-13 DIAGNOSIS — Z0000 Encounter for general adult medical examination without abnormal findings: Secondary | ICD-10-CM

## 2020-10-13 DIAGNOSIS — R7303 Prediabetes: Secondary | ICD-10-CM | POA: Diagnosis not present

## 2020-10-13 DIAGNOSIS — M4316 Spondylolisthesis, lumbar region: Secondary | ICD-10-CM | POA: Diagnosis not present

## 2020-10-13 DIAGNOSIS — M8588 Other specified disorders of bone density and structure, other site: Secondary | ICD-10-CM | POA: Diagnosis not present

## 2020-10-13 DIAGNOSIS — M47816 Spondylosis without myelopathy or radiculopathy, lumbar region: Secondary | ICD-10-CM | POA: Diagnosis not present

## 2020-10-13 DIAGNOSIS — F419 Anxiety disorder, unspecified: Secondary | ICD-10-CM

## 2020-10-13 DIAGNOSIS — Z9181 History of falling: Secondary | ICD-10-CM

## 2020-10-13 DIAGNOSIS — Z1329 Encounter for screening for other suspected endocrine disorder: Secondary | ICD-10-CM | POA: Diagnosis not present

## 2020-10-13 DIAGNOSIS — E876 Hypokalemia: Secondary | ICD-10-CM

## 2020-10-13 DIAGNOSIS — I10 Essential (primary) hypertension: Secondary | ICD-10-CM | POA: Diagnosis not present

## 2020-10-13 DIAGNOSIS — H547 Unspecified visual loss: Secondary | ICD-10-CM

## 2020-10-13 DIAGNOSIS — M5136 Other intervertebral disc degeneration, lumbar region: Secondary | ICD-10-CM | POA: Diagnosis not present

## 2020-10-13 DIAGNOSIS — K219 Gastro-esophageal reflux disease without esophagitis: Secondary | ICD-10-CM

## 2020-10-13 DIAGNOSIS — D329 Benign neoplasm of meninges, unspecified: Secondary | ICD-10-CM

## 2020-10-13 DIAGNOSIS — E2839 Other primary ovarian failure: Secondary | ICD-10-CM

## 2020-10-13 DIAGNOSIS — S32030D Wedge compression fracture of third lumbar vertebra, subsequent encounter for fracture with routine healing: Secondary | ICD-10-CM | POA: Diagnosis not present

## 2020-10-13 DIAGNOSIS — I2510 Atherosclerotic heart disease of native coronary artery without angina pectoris: Secondary | ICD-10-CM

## 2020-10-13 DIAGNOSIS — G4700 Insomnia, unspecified: Secondary | ICD-10-CM

## 2020-10-13 DIAGNOSIS — S32030A Wedge compression fracture of third lumbar vertebra, initial encounter for closed fracture: Secondary | ICD-10-CM | POA: Diagnosis not present

## 2020-10-13 DIAGNOSIS — Z79899 Other long term (current) drug therapy: Secondary | ICD-10-CM | POA: Insufficient documentation

## 2020-10-13 DIAGNOSIS — K449 Diaphragmatic hernia without obstruction or gangrene: Secondary | ICD-10-CM

## 2020-10-13 HISTORY — DX: Encounter for general adult medical examination without abnormal findings: Z00.00

## 2020-10-13 HISTORY — DX: Overactive bladder: N32.81

## 2020-10-13 MED ORDER — TRAZODONE HCL 50 MG PO TABS: 50.0000 mg | ORAL_TABLET | Freq: Every evening | ORAL | 3 refills | Status: DC | PRN

## 2020-10-13 MED ORDER — BUPROPION HCL ER (XL) 150 MG PO TB24: 150.0000 mg | ORAL_TABLET | Freq: Every day | ORAL | 3 refills | Status: DC

## 2020-10-13 MED ORDER — AMLODIPINE BESYLATE 5 MG PO TABS: 5.0000 mg | ORAL_TABLET | Freq: Every day | ORAL | 3 refills | Status: DC

## 2020-10-13 MED ORDER — PANTOPRAZOLE SODIUM 40 MG PO TBEC: 40.0000 mg | DELAYED_RELEASE_TABLET | Freq: Two times a day (BID) | ORAL | 3 refills | Status: DC

## 2020-10-13 NOTE — Progress Notes (Signed)
Chief Complaint  Patient presents with  . Follow-up   Annual  1. htn bp low normal today on norvasc 5 mg qd lasix 20 mg qd prn  2. C/o overactive bladder and previously took meds for this rx but caused dry mouth  3. Falls x 2 had in 05/2020 and hit baseboard with bad and still c/o pain noted CT chest with L3 compression fracture and arthritis in neck and mid and low back nothing tried other than tylenol she fell off the bed is how she fell not sure if too close to the edge  Since she has had moderate low back pain around her waist w/o radiation declines PT for now  4. C/o reduced peripheral vision and looking down will refer [place] [facility]  5. C/o sob with exertion and NM stress test in 2020 intermediate risk with CT chest 05/2020 +CAD will CC Dr. Saunders Revel to see if further w/u rec    Review of Systems  Constitutional: Negative for weight loss.  HENT: Negative for hearing loss.   Eyes: Negative for blurred vision.  Respiratory: Positive for shortness of breath.   Cardiovascular: Negative for chest pain.  Gastrointestinal: Negative for abdominal pain.  Musculoskeletal: Positive for falls.  Skin: Negative for rash.  Neurological: Negative for dizziness.  Psychiatric/Behavioral: Negative for depression.   Past Medical History:  Diagnosis Date  . Anxiety   . CAD (coronary artery disease)   . Cancer (HCC)    basal cell-right nostril  . Fatty liver   . GERD (gastroesophageal reflux disease)   . Heart murmur   . Hemorrhoids   . Hiatal hernia   . History of skin cancer    Right arm  . HTN (hypertension)   . Hyperlipidemia   . Insomnia   . Insomnia   . Pneumonia October 2016  . Rosacea 11/24/2013  . Vitamin D deficiency    Past Surgical History:  Procedure Laterality Date  . APPENDECTOMY    . BASAL CELL CARCINOMA EXCISION     right nostril  . CATARACT EXTRACTION W/PHACO Left 01/25/2016   Procedure: CATARACT EXTRACTION PHACO AND INTRAOCULAR LENS PLACEMENT (IOC);  Surgeon: Birder Robson, MD;  Location: [facility] ORS;  Service: Ophthalmology;  Laterality: Left;  [place] 33.9 AP% 15.5 CDE 5.28 Fluid Pack Lot # [id number]  . CATARACT EXTRACTION W/PHACO Right 02/22/2016   Procedure: CATARACT EXTRACTION PHACO AND INTRAOCULAR LENS PLACEMENT (IOC);  Surgeon: Birder Robson, MD;  Location: [facility] ORS;  Service: Ophthalmology;  Laterality: Right;  [place] 00:40 AP% 23.6 CDE 9.45 fluid pack lot # [id number]  . MOHS SURGERY     bcc right nasal ala 01/2020  . TUBAL LIGATION  1981   Family History  Problem Relation Age of Onset  . Heart disease Mother 71       aortic valve- had AVR- died 6 wks after  . Hyperlipidemia Mother   . Aneurysm Father 29       aortic aneursym  . Heart disease Father   . Heart disease Brother 56       CABG  . Colon cancer Neg Hx   . Stomach cancer Neg Hx    Social History   Socioeconomic History  . Marital status: Single    Spouse name: Not on file  . Number of children: Not on file  . Years of education: Not on file  . Highest education level: Not on file  Occupational History  . Not on file  Tobacco Use  . Smoking status:  Former Smoker    Packs/day: 0.25    Years: 22.00    Pack years: 5.50    Types: Cigarettes    Quit date: 1980    Years since quitting: 42.1  . Smokeless tobacco: Never Used  Vaping Use  . Vaping Use: Never used  Substance and Sexual Activity  . Alcohol use: No  . Drug use: No  . Sexual activity: Not on file  Other Topics Concern  . 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 Difficulty of Paying Living Expenses: Not hard at all  Food Insecurity: No Food Insecurity  . Worried About Charity fundraiser in the Last Year: Never true  . Ran Out of Food in the Last Year: Never true  Transportation Needs: No Transportation Needs  . Lack of Transportation (Medical): No  . Lack of  Transportation (Non-Medical): No  Physical Activity: Not on file  Stress: No Stress Concern Present  . Feeling of Stress : Not at all  Social Connections: Unknown  . Frequency of Communication with Friends and Family: More than three times a week  . Frequency of Social Gatherings with Friends and Family: Not on file  . Attends Religious Services: Not on file  . Active Member of Clubs or Organizations: Not on file  . Attends Archivist Meetings: Not on file  . Marital Status: Not on file  Intimate Partner Violence: Not At Risk  . Fear of Current or Ex-Partner: No  . Emotionally Abused: No  . Physically Abused: No  . Sexually Abused: No   Current Meds  Medication Sig  . aspirin 81 MG tablet Take 81 mg by mouth daily.  . Biotin 10000 MCG TABS Take 1 tablet by mouth daily.  . Calcium Carb-Cholecalciferol (CALCIUM+D3 PO) Take 1 tablet by mouth daily.  . citalopram (CELEXA) 20 MG tablet Take 1 tablet (20 mg total) by mouth daily.  . cyanocobalamin 1000 MCG tablet Take 1,000 mcg by mouth daily.  . furosemide (LASIX) 20 MG tablet Take 1 tablet (20 mg total) by mouth daily as needed.  . lidocaine (LIDODERM) 5 % Place 1 patch onto the skin daily. Remove & Discard patch within 12 hours or as directed by MD  . metroNIDAZOLE (METROCREAM) 0.75 % cream Apply topically 2 (two) times daily.  Marland Kitchen REPATHA SURECLICK 161 MG/ML SOAJ INJECT 1 PEN INTO THE SKIN EVERY 14 DAYS  . VITAMIN D, ERGOCALCIFEROL, PO Take 5,000 Units by mouth daily.  . [DISCONTINUED] amLODipine (NORVASC) 5 MG tablet Take 1 tablet (5 mg total) by mouth daily.  . [DISCONTINUED] buPROPion (WELLBUTRIN XL) 150 MG 24 hr tablet Take 1 tablet (150 mg total) by mouth daily.  . [DISCONTINUED] pantoprazole (PROTONIX) 40 MG tablet Take 1 tablet (40 mg total) by mouth 2 (two) times daily before a meal. 30 minute before food  . [DISCONTINUED] traZODone (DESYREL) 50 MG tablet Take 1 tablet (50 mg total) by mouth at bedtime as needed for  sleep.   Allergies  Allergen Reactions  . Ampicillin Hives, Shortness Of Breath and Swelling    Has patient had a PCN reaction causing immediate rash, facial/tongue/throat swelling, SOB or lightheadedness with hypotension: Yes Has patient had a PCN reaction causing severe rash involving mucus membranes or skin necrosis: No Has patient had  a PCN reaction that required hospitalization No Has patient had a PCN reaction occurring within the last 10 years: No If all of the above answers are "NO", then may proceed with Cephalosporin use.   Marland Kitchen Penicillins Hives and Shortness Of Breath    Has patient had a PCN reaction causing immediate rash, facial/tongue/throat swelling, SOB or lightheadedness with hypotension: Yes Has patient had a PCN reaction causing severe rash involving mucus membranes or skin necrosis: No Has patient had a PCN reaction that required hospitalization No Has patient had a PCN reaction occurring within the last 10 years: No If all of the above answers are "NO", then may proceed with Cephalosporin use.   Marland Kitchen Crestor [Rosuvastatin Calcium]     Burning sensation  . Ezetimibe-Simvastatin   . Niaspan [Niacin Er]     Fatigues, aches.  . Vytorin [Ezetimibe-Simvastatin]   . Zetia [Ezetimibe]   . Zocor [Simvastatin]   . Fenofibrate     Constipation and confusion  . Welchol [Colesevelam Hcl] Other (See Comments)    Severe bloating even on low dose   No results found for this or any previous visit (from the past 2160 hour(s)). Objective  Body mass index is 33.28 kg/m. Wt Readings from Last 3 Encounters:  10/13/20 170 lb 6.4 oz (77.3 kg)  09/22/20 171 lb 6 oz (77.7 kg)  06/11/20 169 lb 6.4 oz (76.8 kg)   Temp Readings from Last 3 Encounters:  10/13/20 99.2 F (37.3 C) (Oral)  06/11/20 98.8 F (37.1 C) (Oral)  04/07/20 98.1 F (36.7 C) (Oral)   BP Readings from Last 3 Encounters:  10/13/20 102/66  09/22/20 130/80  06/11/20 (!) 120/99   Pulse Readings from Last 3  Encounters:  10/13/20 70  09/22/20 63  06/11/20 65    Physical Exam Vitals and nursing note reviewed.  Constitutional:      Appearance: Normal appearance. She is well-developed and well-groomed. She is obese.  HENT:     Head: Normocephalic and atraumatic.  Eyes:     Conjunctiva/sclera: Conjunctivae normal.     Pupils: Pupils are equal, round, and reactive to light.  Cardiovascular:     Rate and Rhythm: Normal rate and regular rhythm.     Heart sounds: Normal heart sounds. No murmur heard.   Pulmonary:     Effort: Pulmonary effort is normal.     Breath sounds: Normal breath sounds.  Abdominal:     Tenderness: There is no abdominal tenderness.  Musculoskeletal:       Back:  Skin:    General: Skin is warm and dry.  Neurological:     General: No focal deficit present.     Mental Status: She is alert and oriented to person, place, and time. Mental status is at baseline.     Gait: Gait normal.  Psychiatric:        Attention and Perception: Attention and perception normal.        Mood and Affect: Mood and affect normal.        Speech: Speech normal.        Behavior: Behavior normal. Behavior is cooperative.        Thought Content: Thought content normal.        Cognition and Memory: Cognition and memory normal.        Judgment: Judgment normal.     Assessment  Plan  Annual physical exam Flu shot utd  prevnar utd  pna had in 2017 2/2shingrix Tdap8/10/20 covid 19 2/2consider booster [employer]  Labs8/2021  Mammogram neg2/10/21 ordered 2022call to schedule Out of age window pap 04/22/14 negative  DEXA 06/10/18 osteopenia ordered DEXA call to schedule  Colonoscopy In 2007 hemorrhoids had FIT test negative 07/06/17 due at f/u 06/2020 or after resch 11/24/20 Dr. Vicente Males  Skin Dr. 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 derm bx 01/01/20 bx left eyebrow AK, right nasal ala BCC, right crust  Due [facility] derm last seen  03/31/20 due for reschedule as of 10/13/20 given # to call and schedule  rec healthy diet and exercise   Hypertension, unspecified type - Plan: Lipid panel, Comprehensive metabolic panel, On norvasc 5 mg qd and lasix 20 mg qd prn  Hyperlipidemia, unspecified hyperlipidemia type - Plan: Lipid pane  Prediabetes - Plan: Hemoglobin A1c, CANCELED: Hemoglobin A1c  Closed compression fracture of L3 lumbar vertebra with routine healing, subsequent encounter - Plan: DG Thoracic Spine 2 View, DG Lumbar Spine Complete, DG Bone Density Arthritis of lumbar spine - Plan: DG Thoracic Spine 2 View, DG Lumbar Spine Complete, DG Bone Density -declines PT today  Given back brace Rx today  salonpas or lidocaine pain patch   Reduced vision - Plan: Ambulatory referral to Ophthalmology [facility]) - Plan: Ambulatory referral to Ophthalmology  Anxiety and depression - Plan: buPROPion (WELLBUTRIN XL) 150 MG 24 hr tablet  Gastroesophageal reflux disease - Plan: pantoprazole (PROTONIX) 40 MG tablet  Hiatal hernia - Plan: pantoprazole (PROTONIX) 40 MG tablet  Insomnia, unspecified type - Plan: traZODone (DESYREL) 50 MG tablet  Coronary artery disease involving native coronary artery of native heart without angina pectoris -f/u with Dr. Saunders Revel see HPI if wants further w/u   Overactive bladder  Consider kegels and pelvic PT  consider med if desired in future  Provider: Dr. Olivia Mackie McLean-Scocuzza-Internal Medicine

## 2020-10-13 NOTE — Patient Instructions (Addendum)
[facility]  Try a brace for back  Consider Physical therapy  Consider lidocaine/salonpas pain patch  Tylenol   sch mammogram and bone density please   See if your daughter can order from [facility] upper arm blood pressure cuff adult regular and pulse oximeter to check heart rate and oxygen  Omron is good brand blood pressure cuff    Call dermatology and schedule regular f/u you are due   01/01/2020 Office Visit [place]   [phone]  Keith Rake, MD  [address]  [place] [phone]  [facility]  [place], [place] [address]  [phone]  [phone] (Fax)    Kegel Exercises  Kegel exercises can help strengthen your pelvic floor muscles. The pelvic floor is a group of muscles that support your rectum, small intestine, and bladder. In females, pelvic floor muscles also help support the womb (uterus). These muscles help you control the flow of urine and stool. Kegel exercises are painless and simple, and they do not require any equipment. Your provider may suggest Kegel exercises to:  Improve bladder and bowel control.  Improve sexual response.  Improve weak pelvic floor muscles after surgery to remove the uterus (hysterectomy) or pregnancy (females).  Improve weak pelvic floor muscles after prostate gland removal or surgery (males). Kegel exercises involve squeezing your pelvic floor muscles, which are the same muscles you squeeze when you try to stop the flow of urine or keep from passing gas. The exercises can be done while sitting, standing, or lying down, but it is best to vary your position. Exercises How to do Kegel exercises: 1. Squeeze your pelvic floor muscles tight. You should feel a tight lift in your rectal area. If you are a female, you should also feel a tightness in your vaginal area. Keep your stomach, buttocks, and legs relaxed. 2. Hold the muscles tight for up to 10 seconds. 3. Breathe normally. 4. Relax your  muscles. 5. Repeat as told by your health care provider. Repeat this exercise daily as told by your health care provider. Continue to do this exercise for at least 4-6 weeks, or for as long as told by your health care provider. You may be referred to a physical therapist who can help you learn more about how to do Kegel exercises. Depending on your condition, your health care provider may recommend:  Varying how long you squeeze your muscles.  Doing several sets of exercises every day.  Doing exercises for several weeks.  Making Kegel exercises a part of your regular exercise routine. This information is not intended to replace advice given to you by your health care provider. Make sure you discuss any questions you have with your health care provider. Document Revised: 12/19/2019 Document Reviewed: 04/03/2018 Elsevier Patient Education  2021 [place].    Overactive Bladder, Adult  Overactive bladder is a condition in which a person has a sudden and frequent need to urinate. A person might also leak urine if he or she cannot get to the bathroom fast enough (urinary incontinence). Sometimes, symptoms can interfere with work or social activities. What are the causes? Overactive bladder is associated with poor nerve signals between your bladder and your brain. Your bladder may get the signal to empty before it is full. You may also have very sensitive muscles that make your bladder squeeze too soon. This condition may also be caused by other factors, such as:  Medical conditions: ? Urinary tract infection. ? Infection of nearby tissues. ?  Prostate enlargement. ? Bladder stones, inflammation, or tumors. ? Diabetes. ? Muscle or nerve weakness, especially from these conditions:  A spinal cord injury.  Stroke.  Multiple sclerosis.  Parkinson's disease.  Other causes: ? Surgery on the uterus or urethra. ? Drinking too much caffeine or alcohol. ? Certain medicines, especially  those that eliminate extra fluid in the body (diuretics). ? Constipation. What increases the risk? You may be at greater risk for overactive bladder if you:  Are an older adult.  Smoke.  Are going through menopause.  Have prostate problems.  Have a neurological disease, such as stroke, dementia, Parkinson's disease, or multiple sclerosis (MS).  Eat or drink alcohol, spicy food, caffeine, and other things that irritate the bladder.  Are overweight or obese. What are the signs or symptoms? Symptoms of this condition include a sudden, strong urge to urinate. Other symptoms include:  Leaking urine.  Urinating 8 or more times a day.  Waking up to urinate 2 or more times overnight. How is this diagnosed? This condition may be diagnosed based on:  Your symptoms and medical history.  A physical exam.  Blood or urine tests to check for possible causes, such as infection. You may also need to see a health care provider who specializes in urinary tract problems. This is called a urologist. How is this treated? Treatment for overactive bladder depends on the cause of your condition and whether it is mild or severe. Treatment may include:  Bladder training, such as: ? Learning to control the urge to urinate by following a schedule to urinate at regular intervals. ? Doing Kegel exercises to strengthen the pelvic floor muscles that support your bladder.  Special devices, such as: ? Biofeedback. This uses sensors to help you become aware of your body's signals. ? Electrical stimulation. This uses electrodes placed inside the body (implanted) or outside the body. These electrodes send gentle pulses of electricity to strengthen the nerves or muscles that control the bladder. ? Women may use a plastic device, called a pessary, that fits into the vagina and supports the bladder.  Medicines, such as: ? Antibiotics to treat bladder infection. ? Antispasmodics to stop the bladder from  releasing urine at the wrong time. ? Tricyclic antidepressants to relax bladder muscles. ? Injections of botulinum toxin type A directly into the bladder tissue to relax bladder muscles.  Surgery, such as: ? A device may be implanted to help manage the nerve signals that control urination. ? An electrode may be implanted to stimulate electrical signals in the bladder. ? A procedure may be done to change the shape of the bladder. This is done only in very severe cases. Follow these instructions at home: Eating and drinking  Make diet or lifestyle changes recommended by your health care provider. These may include: ? Drinking fluids throughout the day and not only with meals. ? Cutting down on caffeine or alcohol. ? Eating a healthy and balanced diet to prevent constipation. This may include:  Choosing foods that are high in fiber, such as beans, whole grains, and fresh fruits and vegetables.  Limiting foods that are high in fat and processed sugars, such as fried and sweet foods.   Lifestyle  Lose weight if needed.  Do not use any products that contain nicotine or tobacco. These include cigarettes, chewing tobacco, and vaping devices, such as e-cigarettes. If you need help quitting, ask your health care provider.   General instructions  Take over-the-counter and prescription medicines only as told  by your health care provider.  If you were prescribed an antibiotic medicine, take it as told by your health care provider. Do not stop taking the antibiotic even if you start to feel better.  Use any implants or pessary as told by your health care provider.  If needed, wear pads to absorb urine leakage.  Keep a log to track how much and when you drink, and when you need to urinate. This will help your health care provider monitor your condition.  Keep all follow-up visits. This is important. Contact a health care provider if:  You have a fever or chills.  Your symptoms do not get  better with treatment.  Your pain and discomfort get worse.  You have more frequent urges to urinate. Get help right away if:  You are not able to control your bladder. Summary  Overactive bladder refers to a condition in which a person has a sudden and frequent need to urinate.  Several conditions may lead to an overactive bladder.  Treatment for overactive bladder depends on the cause and severity of your condition.  Making lifestyle changes, doing Kegel exercises, keeping a log, and taking medicines can help with this condition. This information is not intended to replace advice given to you by your health care provider. Make sure you discuss any questions you have with your health care provider. Document Revised: 05/03/2020 Document Reviewed: 05/03/2020 Elsevier Patient Education  2021 [place].    Thoracic Strain Rehab Ask your health care provider which exercises are safe for you. Do exercises exactly as told by your health care provider and adjust them as directed. It is normal to feel mild stretching, pulling, tightness, or discomfort as you do these exercises. Stop right away if you feel sudden pain or your pain gets worse. Do not begin these exercises until told by your health care provider. Stretching and range-of-motion exercise This exercise warms up your muscles and joints and improves the movement and flexibility of your back and shoulders. This exercise also helps to relieve pain. Chest and spine stretch 1. Lie down on your back on a firm surface. 2. Roll a towel or a small blanket so it is about 4 inches (10 cm) in diameter. 3. Put the towel lengthwise under the middle of your back so it is under your spine, but not under your shoulder blades. 4. Put your hands behind your head and let your elbows fall to your sides. This will increase your stretch. 5. Take a deep breath (inhale). 6. Hold for __________ seconds. 7. Relax after you breathe out (exhale). Repeat  __________ times. Complete this exercise __________ times a day.   Strengthening exercises These exercises build strength and endurance in your back and your shoulder blade muscles. Endurance is the ability to use your muscles for a long time, even after they get tired. Alternating arm and leg raises 1. Get on your hands and knees on a firm surface. If you are on a hard floor, you may want to use padding, such as an exercise mat, to cushion your knees. 2. Line up your arms and legs. Your hands should be directly below your shoulders, and your knees should be directly below your hips. 3. Lift your left leg behind you. At the same time, raise your right arm and straighten it in front of you. ? Do not lift your leg higher than your hip. ? Do not lift your arm higher than your shoulder. ? Keep your abdominal and back  muscles tight. ? Keep your hips facing the ground. ? Do not arch your back. ? Keep your balance carefully, and do not hold your breath. 4. Hold for __________ seconds. 5. Slowly return to the starting position and repeat with your right leg and your left arm. Repeat __________ times. Complete this exercise __________ times a day.   Straight arm rows This exercise is also called shoulder extension exercise. 1. Stand with your feet shoulder width apart. 2. Secure an exercise band to a stable object in front of you so the band is at or above shoulder height. 3. Hold one end of the exercise band in each hand. 4. Straighten your elbows and lift your hands up to shoulder height. 5. Step back, away from the secured end of the exercise band, until the band stretches. 6. Squeeze your shoulder blades together and pull your hands down to the sides of your thighs. Stop when your hands are straight down by your sides. This is shoulder extension. Do not let your hands go behind your body. 7. Hold for __________ seconds. 8. Slowly return to the starting position. Repeat __________ times. Complete  this exercise __________ times a day.   Prone shoulder external rotation 1. Lie on your abdomen on a firm bed so your left / right forearm hangs over the edge of the bed and your upper arm is on the bed, straight out from your body. This is the prone position. ? Your elbow should be bent. ? Your palm should be facing your feet. 2. If instructed, hold a __________ weight in your hand. 3. Squeeze your shoulder blade toward the middle of your back. Do not let your shoulder lift toward your ear. 4. Keep your elbow bent in a 90-degree angle (right angle) while you slowly move your forearm up toward the ceiling. Move your forearm up to the height of the bed, toward your head. This is external rotation. ? Your upper arm should not move. ? At the top of the movement, your palm should face the floor. 5. Hold for __________ seconds. 6. Slowly return to the starting position and relax your muscles. Repeat __________ times. Complete this exercise __________ times a day. Rowing scapular retraction This is an exercise in which the shoulder blades (scapulae) are pulled toward each other (retraction). 1. Sit in a stable chair without armrests, or stand up. 2. Secure an exercise band to a stable object in front of you so the band is at shoulder height. 3. Hold one end of the exercise band in each hand. Your palms should face down. 4. Bring your arms out straight in front of you. 5. Step back, away from the secured end of the exercise band, until the band stretches. 6. Pull the band backward. As you do this, bend your elbows and squeeze your shoulder blades together, but avoid letting the rest of your body move. Do not shrug your shoulders upward while you do this. 7. Stop when your elbows are at your sides or slightly behind your body. 8. Hold for __________ seconds. 9. Slowly straighten your arms to return to the starting position. Repeat __________ times. Complete this exercise __________ times a day.    Posture and body mechanics Good posture and healthy body mechanics can help to relieve stress in your body's tissues and joints. Body mechanics refers to the movements and positions of your body while you do your daily activities. Posture is part of body mechanics. Good posture means:  Your spine is  in its natural S-curve position (neutral).  Your shoulders are pulled back slightly.  Your head is not tipped forward. Follow these guidelines to improve your posture and body mechanics in your everyday activities. Standing  When standing, keep your spine neutral and your feet about hip width apart. Keep a slight bend in your knees. Your ears, shoulders, and hips should line up with each other.  When you do a task in which you lean forward while standing in one place for a long time, place one foot up on a stable object that is 2-4 inches (5-10 cm) high, such as a footstool. This helps keep your spine neutral.   Sitting  When sitting, keep your spine neutral and keep your feet flat on the floor. Use a footrest, if necessary, and keep your thighs parallel to the floor. Avoid rounding your shoulders, and avoid tilting your head forward.  When working at a desk or a computer, keep your desk at a height where your hands are slightly lower than your elbows. Slide your chair under your desk so you are close enough to maintain good posture.  When working at a computer, place your monitor at a height where you are looking straight ahead and you do not have to tilt your head forward or downward to look at the screen.   Resting When lying down and resting, avoid positions that are most painful for you.  If you have pain with activities such as sitting, bending, stooping, or squatting (flexion-basedactivities), lie in a position in which your body does not bend very much. For example, avoid curling up on your side with your arms and knees near your chest (fetal position).  If you have pain with  activities such as standing for a long time or reaching with your arms (extension-basedactivities), lie with your spine in a neutral position and bend your knees slightly. Try the following positions: ? Lie on your side with a pillow between your knees. ? Lie on your back with a pillow under your knees.   Lifting  When lifting objects, keep your feet at least shoulder width apart and tighten your abdominal muscles.  Bend your knees and hips and keep your spine neutral. It is important to lift using the strength of your legs, not your back. Do not lock your knees straight out.  Always ask for help to lift heavy or awkward objects.   This information is not intended to replace advice given to you by your health care provider. Make sure you discuss any questions you have with your health care provider. Document Revised: 12/06/2018 Document Reviewed: 09/23/2018 Elsevier Patient Education  [place] or Strain Rehab Ask your health care provider which exercises are safe for you. Do exercises exactly as told by your health care provider and adjust them as directed. It is normal to feel mild stretching, pulling, tightness, or discomfort as you do these exercises. Stop right away if you feel sudden pain or your pain gets worse. Do not begin these exercises until told by your health care provider. Stretching and range-of-motion exercises These exercises warm up your muscles and joints and improve the movement and flexibility of your back. These exercises also help to relieve pain, numbness, and tingling. Lumbar rotation 1. Lie on your back on a firm surface and bend your knees. 2. Straighten your arms out to your sides so each arm forms a 90-degree angle (right angle) with a side of your body. 3.  Slowly move (rotate) both of your knees to one side of your body until you feel a stretch in your lower back (lumbar). Try not to let your shoulders lift off the floor. 4. Hold this  position for __________ seconds. 5. Tense your abdominal muscles and slowly move your knees back to the starting position. 6. Repeat this exercise on the other side of your body. Repeat __________ times. Complete this exercise __________ times a day.   Single knee to chest 1. Lie on your back on a firm surface with both legs straight. 2. Bend one of your knees. Use your hands to move your knee up toward your chest until you feel a gentle stretch in your lower back and buttock. ? Hold your leg in this position by holding on to the front of your knee. ? Keep your other leg as straight as possible. 3. Hold this position for __________ seconds. 4. Slowly return to the starting position. 5. Repeat with your other leg. Repeat __________ times. Complete this exercise __________ times a day.   Prone extension on elbows 1. Lie on your abdomen on a firm surface (prone position). 2. Prop yourself up on your elbows. 3. Use your arms to help lift your chest up until you feel a gentle stretch in your abdomen and your lower back. ? This will place some of your body weight on your elbows. If this is uncomfortable, try stacking pillows under your chest. ? Your hips should stay down, against the surface that you are lying on. Keep your hip and back muscles relaxed. 4. Hold this position for __________ seconds. 5. Slowly relax your upper body and return to the starting position. Repeat __________ times. Complete this exercise __________ times a day.   Strengthening exercises These exercises build strength and endurance in your back. Endurance is the ability to use your muscles for a long time, even after they get tired. Pelvic tilt This exercise strengthens the muscles that lie deep in the abdomen. 1. Lie on your back on a firm surface. Bend your knees and keep your feet flat on the floor. 2. Tense your abdominal muscles. Tip your pelvis up toward the ceiling and flatten your lower back into the floor. ? To  help with this exercise, you may place a small towel under your lower back and try to push your back into the towel. 3. Hold this position for __________ seconds. 4. Let your muscles relax completely before you repeat this exercise. Repeat __________ times. Complete this exercise __________ times a day. Alternating arm and leg raises 1. Get on your hands and knees on a firm surface. If you are on a hard floor, you may want to use padding, such as an exercise mat, to cushion your knees. 2. Line up your arms and legs. Your hands should be directly below your shoulders, and your knees should be directly below your hips. 3. Lift your left leg behind you. At the same time, raise your right arm and straighten it in front of you. ? Do not lift your leg higher than your hip. ? Do not lift your arm higher than your shoulder. ? Keep your abdominal and back muscles tight. ? Keep your hips facing the ground. ? Do not arch your back. ? Keep your balance carefully, and do not hold your breath. 4. Hold this position for __________ seconds. 5. Slowly return to the starting position. 6. Repeat with your right leg and your left arm. Repeat __________ times. Complete  this exercise __________ times a day.   Abdominal set with straight leg raise 1. Lie on your back on a firm surface. 2. Bend one of your knees and keep your other leg straight. 3. Tense your abdominal muscles and lift your straight leg up, 4-6 inches (10-15 cm) off the ground. 4. Keep your abdominal muscles tight and hold this position for __________ seconds. ? Do not hold your breath. ? Do not arch your back. Keep it flat against the ground. 5. Keep your abdominal muscles tense as you slowly lower your leg back to the starting position. 6. Repeat with your other leg. Repeat __________ times. Complete this exercise __________ times a day.   Single leg lower with bent knees 1. Lie on your back on a firm surface. 2. Tense your abdominal muscles  and lift your feet off the floor, one foot at a time, so your knees and hips are bent in 90-degree angles (right angles). ? Your knees should be over your hips and your lower legs should be parallel to the floor. 3. Keeping your abdominal muscles tense and your knee bent, slowly lower one of your legs so your toe touches the ground. 4. Lift your leg back up to return to the starting position. ? Do not hold your breath. ? Do not let your back arch. Keep your back flat against the ground. 5. Repeat with your other leg. Repeat __________ times. Complete this exercise __________ times a day. Posture and body mechanics Good posture and healthy body mechanics can help to relieve stress in your body's tissues and joints. Body mechanics refers to the movements and positions of your body while you do your daily activities. Posture is part of body mechanics. Good posture means:  Your spine is in its natural S-curve position (neutral).  Your shoulders are pulled back slightly.  Your head is not tipped forward. Follow these guidelines to improve your posture and body mechanics in your everyday activities. Standing  When standing, keep your spine neutral and your feet about hip width apart. Keep a slight bend in your knees. Your ears, shoulders, and hips should line up.  When you do a task in which you stand in one place for a long time, place one foot up on a stable object that is 2-4 inches (5-10 cm) high, such as a footstool. This helps keep your spine neutral.   Sitting  When sitting, keep your spine neutral and keep your feet flat on the floor. Use a footrest, if necessary, and keep your thighs parallel to the floor. Avoid rounding your shoulders, and avoid tilting your head forward.  When working at a desk or a computer, keep your desk at a height where your hands are slightly lower than your elbows. Slide your chair under your desk so you are close enough to maintain good posture.  When working  at a computer, place your monitor at a height where you are looking straight ahead and you do not have to tilt your head forward or downward to look at the screen.   Resting  When lying down and resting, avoid positions that are most painful for you.  If you have pain with activities such as sitting, bending, stooping, or squatting, lie in a position in which your body does not bend very much. For example, avoid curling up on your side with your arms and knees near your chest (fetal position).  If you have pain with activities such as standing for a long  time or reaching with your arms, lie with your spine in a neutral position and bend your knees slightly. Try the following positions: ? Lying on your side with a pillow between your knees. ? Lying on your back with a pillow under your knees. Lifting  When lifting objects, keep your feet at least shoulder width apart and tighten your abdominal muscles.  Bend your knees and hips and keep your spine neutral. It is important to lift using the strength of your legs, not your back. Do not lock your knees straight out.  Always ask for help to lift heavy or awkward objects.   This information is not intended to replace advice given to you by your health care provider. Make sure you discuss any questions you have with your health care provider. Document Revised: 12/06/2018 Document Reviewed: 09/05/2018 Elsevier Patient Education  2021 [place].  Back Exercises The following exercises strengthen the muscles that help to support the trunk and back. They also help to keep the lower back flexible. Doing these exercises can help to prevent back pain or lessen existing pain.  If you have back pain or discomfort, try doing these exercises 2-3 times each day or as told by your health care provider.  As your pain improves, do them once each day, but increase the number of times that you repeat the steps for each exercise (do more repetitions).  To  prevent the recurrence of back pain, continue to do these exercises once each day or as told by your health care provider. Do exercises exactly as told by your health care provider and adjust them as directed. It is normal to feel mild stretching, pulling, tightness, or discomfort as you do these exercises, but you should stop right away if you feel sudden pain or your pain gets worse. Exercises Single knee to chest Repeat these steps 3-5 times for each leg: 8. Lie on your back on a firm bed or the floor with your legs extended. 9. Bring one knee to your chest. Your other leg should stay extended and in contact with the floor. 10. Hold your knee in place by grabbing your knee or thigh with both hands and hold. 11. Pull on your knee until you feel a gentle stretch in your lower back or buttocks. 12. Hold the stretch for 10-30 seconds. 13. Slowly release and straighten your leg. Pelvic tilt Repeat these steps 5-10 times: 1. Lie on your back on a firm bed or the floor with your legs extended. 2. Bend your knees so they are pointing toward the ceiling and your feet are flat on the floor. 3. Tighten your lower abdominal muscles to press your lower back against the floor. This motion will tilt your pelvis so your tailbone points up toward the ceiling instead of pointing to your feet or the floor. 4. With gentle tension and even breathing, hold this position for 5-10 seconds. Cat-cow Repeat these steps until your lower back becomes more flexible: 1. Get into a hands-and-knees position on a firm surface. Keep your hands under your shoulders, and keep your knees under your hips. You may place padding under your knees for comfort. 2. Let your head hang down toward your chest. Contract your abdominal muscles and point your tailbone toward the floor so your lower back becomes rounded like the back of a cat. 3. Hold this position for 5 seconds. 4. Slowly lift your head, let your abdominal muscles relax and  point your tailbone up toward the  ceiling so your back forms a sagging arch like the back of a cow. 5. Hold this position for 5 seconds.   Press-ups Repeat these steps 5-10 times: 1. Lie on your abdomen (face-down) on the floor. 2. Place your palms near your head, about shoulder-width apart. 3. Keeping your back as relaxed as possible and keeping your hips on the floor, slowly straighten your arms to raise the top half of your body and lift your shoulders. Do not use your back muscles to raise your upper torso. You may adjust the placement of your hands to make yourself more comfortable. 4. Hold this position for 5 seconds while you keep your back relaxed. 5. Slowly return to lying flat on the floor.   Bridges Repeat these steps 10 times: 1. Lie on your back on a firm surface. 2. Bend your knees so they are pointing toward the ceiling and your feet are flat on the floor. Your arms should be flat at your sides, next to your body. 3. Tighten your buttocks muscles and lift your buttocks off the floor until your waist is at almost the same height as your knees. You should feel the muscles working in your buttocks and the back of your thighs. If you do not feel these muscles, slide your feet 1-2 inches farther away from your buttocks. 4. Hold this position for 3-5 seconds. 5. Slowly lower your hips to the starting position, and allow your buttocks muscles to relax completely. If this exercise is too easy, try doing it with your arms crossed over your chest.   Abdominal crunches Repeat these steps 5-10 times: 1. Lie on your back on a firm bed or the floor with your legs extended. 2. Bend your knees so they are pointing toward the ceiling and your feet are flat on the floor. 3. Cross your arms over your chest. 4. Tip your chin slightly toward your chest without bending your neck. 5. Tighten your abdominal muscles and slowly raise your trunk (torso) high enough to lift your shoulder blades a tiny bit  off the floor. Avoid raising your torso higher than that because it can put too much stress on your low back and does not help to strengthen your abdominal muscles. 6. Slowly return to your starting position. Back lifts Repeat these steps 5-10 times: 1. Lie on your abdomen (face-down) with your arms at your sides, and rest your forehead on the floor. 2. Tighten the muscles in your legs and your buttocks. 3. Slowly lift your chest off the floor while you keep your hips pressed to the floor. Keep the back of your head in line with the curve in your back. Your eyes should be looking at the floor. 4. Hold this position for 3-5 seconds. 5. Slowly return to your starting position. Contact a health care provider if:  Your back pain or discomfort gets much worse when you do an exercise.  Your worsening back pain or discomfort does not lessen within 2 hours after you exercise. If you have any of these problems, stop doing these exercises right away. Do not do them again unless your health care provider says that you can. Get help right away if:  You develop sudden, severe back pain. If this happens, stop doing the exercises right away. Do not do them again unless your health care provider says that you can. This information is not intended to replace advice given to you by your health care provider. Make sure you discuss any questions  you have with your health care provider. Document Revised: 12/19/2018 Document Reviewed: 05/16/2018 Elsevier Patient Education  [place].   Spinal Compression Fracture  A spinal compression fracture is a collapse of the bones that form the spine (vertebrae). With this type of fracture, the vertebrae become pushed (compressed) into a wedge shape. Most compression fractures happen in the middle or lower part of the spine. What are the causes? This condition may be caused by:  Thinning and loss of density in the bones (osteoporosis). This is the most common  cause.  A fall.  A car or motorcycle accident.  Cancer.  Trauma, such as a heavy, direct hit to the head or back. What increases the risk? You are more likely to develop this condition if:  You are 33 years of age or older.  You have osteoporosis.  You have certain types of cancer, including: ? Multiple myeloma. ? Lymphoma. ? Prostate cancer. ? Lung cancer. ? Breast cancer. What are the signs or symptoms? Symptoms of this condition include:  Severe pain with simple movements such as coughing or sneezing.  Pain that gets worse over time.  Pain that is worse when you stand, walk, sit, or bend.  Sudden pain that is so bad that it is hard for you to move.  Bending or humping of the spine.  Gradual loss of height.  Numbness, tingling, or weakness in the back and legs.  Trouble walking. Your symptoms will depend on the cause of the fracture and how quickly it develops. How is this diagnosed? This condition may be diagnosed based on symptoms, medical history, and a physical exam. During the physical exam, your health care provider may tap along the length of your spine to check for tenderness. Tests may be done to confirm the diagnosis. They may include:  A bone mineral density test to check for osteoporosis.  Imaging tests, such as a spine X-ray, CT scan, or MRI. How is this treated? Treatment depends on the cause and severity of the condition. Some fractures may heal on their own with supportive care. Treatment may include:  Pain medicine.  Rest.  A back brace.  Physical therapy exercises.  Medicine to strengthen bone.  Calcium and vitamin D supplements. Fractures that cause the back to become misshapen, cause nerve pain or weakness, or do not respond to other treatment may be treated with surgery. This may include:  Vertebroplasty. Bone cement is injected into the collapsed vertebrae to stabilize them.  Balloon kyphoplasty. The collapsed vertebrae are  expanded with a balloon and then bone cement is injected into them.  Spinal fusion. The collapsed vertebrae are connected (fused) to normal vertebrae. Follow these instructions at home: Medicines  Take over-the-counter and prescription medicines only as told by your health care provider.  Ask your health care provider if the medicine prescribed to you: ? Requires you to avoid driving or using machinery. ? Can cause constipation. You may need to take these actions to prevent or treat constipation:  Drink enough fluid to keep your urine pale yellow.  Take over-the-counter or prescription medicines.  Eat foods that are high in fiber, such as beans, whole grains, and fresh fruits and vegetables.  Limit foods that are high in fat and processed sugars, such as fried or sweet foods. If you have a brace:  Wear the brace as told by your health care provider. Remove it only as told by your health care provider.  Loosen the brace if your fingers or  toes tingle, become numb, or turn cold and blue.  Keep the brace clean.  If the brace is not waterproof: ? Do not let it get wet. ? Cover it with a watertight covering when you take a bath or a shower. Managing pain, stiffness, and swelling  If directed, put ice on the injured area. To do this: ? If you have a removable brace, remove it as told by your health care provider. ? Put ice in a plastic bag. ? Place a towel between your skin and the bag. ? Leave the ice on for 20 minutes, 2-3 times a day. ? Remove the ice if your skin turns bright red. This is very important. If you cannot feel pain, heat, or cold, you have a greater risk of damage to the area.   Activity  Rest as told by your health care provider.  Avoid sitting for a long time without moving. Get up to take short walks every 1-2 hours. This is important to improve blood flow and breathing. Ask for help if you feel weak or unsteady.  Return to your normal activities as told by  your health care provider. Ask what activities are safe for you.  Do physical therapy exercises to improve movement and strength in your back, as recommended by your health care provider.  Exercise regularly as directed by your health care provider. General instructions  Do not drink alcohol. Alcohol can interfere with your treatment.  Do not use any products that contain nicotine or tobacco, such as cigarettes, e-cigarettes, and chewing tobacco. These can delay bone healing. If you need help quitting, ask your health care provider.  Keep all follow-up visits. This is important. It can help to prevent permanent injury, disability, and long-lasting (chronic) pain.   Contact a health care provider if:  You have a fever.  Your pain medicine is not helping.  Your pain does not get better over time.  You cannot return to your normal activities as planned or expected. Get help right away if:  Your pain is very bad and it suddenly gets worse.  You are unable to move any body part (paralysis) that is below the level of your injury.  You have numbness, tingling, or weakness in any body part that is below the level of your injury.  You cannot control your bladder or bowels. Summary  A spinal compression fracture is a collapse of the bones that form the spine (vertebrae).  With this type of fracture, the vertebrae become pushed (compressed) into a wedge shape.  Your symptoms and treatment will depend on the cause and severity of the fracture and how quickly it develops.  Some fractures may heal on their own with supportive care. Fractures that cause the back to become misshapen, cause nerve pain or weakness, or do not respond to other treatment may be treated with surgery. This information is not intended to replace advice given to you by your health care provider. Make sure you discuss any questions you have with your health care provider. Document Revised: 12/03/2019 Document Reviewed:  12/03/2019 Elsevier Patient Education  2021 [employer].

## 2020-10-13 NOTE — Addendum Note (Signed)
Addended by: Orland Mustard on: 10/13/2020 12:34 PM   Modules accepted: Orders

## 2020-10-15 ENCOUNTER — Encounter: Payer: Self-pay | Admitting: Internal Medicine

## 2020-10-15 DIAGNOSIS — M5136 Other intervertebral disc degeneration, lumbar region: Secondary | ICD-10-CM | POA: Insufficient documentation

## 2020-10-15 DIAGNOSIS — S32030A Wedge compression fracture of third lumbar vertebra, initial encounter for closed fracture: Secondary | ICD-10-CM

## 2020-10-15 HISTORY — DX: Wedge compression fracture of third lumbar vertebra, initial encounter for closed fracture: S32.030A

## 2020-10-18 ENCOUNTER — Encounter: Payer: Self-pay | Admitting: Internal Medicine

## 2020-10-21 DIAGNOSIS — Z961 Presence of intraocular lens: Secondary | ICD-10-CM | POA: Diagnosis not present

## 2020-10-21 DIAGNOSIS — H53453 Other localized visual field defect, bilateral: Secondary | ICD-10-CM | POA: Diagnosis not present

## 2020-10-22 ENCOUNTER — Other Ambulatory Visit: Payer: Self-pay | Admitting: Internal Medicine

## 2020-10-22 DIAGNOSIS — F419 Anxiety disorder, unspecified: Secondary | ICD-10-CM

## 2020-11-24 ENCOUNTER — Encounter: Payer: Self-pay | Admitting: Gastroenterology

## 2020-11-24 ENCOUNTER — Other Ambulatory Visit: Payer: Self-pay

## 2020-11-24 ENCOUNTER — Ambulatory Visit: Payer: Medicare Other | Admitting: Gastroenterology

## 2020-11-24 VITALS — BP 125/76 | HR 68 | Ht 60.0 in | Wt 171.8 lb

## 2020-11-24 DIAGNOSIS — K219 Gastro-esophageal reflux disease without esophagitis: Secondary | ICD-10-CM

## 2020-11-24 NOTE — Progress Notes (Signed)
Jonathon Bellows MD, MRCP(U.K) [address]  [place]  [place], [place] [address]  Main: [phone]  Fax: [phone]   Gastroenterology Consultation  Referring Provider:     McLean-Scocuzza, Olivia Mackie * Primary Care Physician:  McLean-Scocuzza, Nino Glow, MD Primary Gastroenterologist:  Dr. Jonathon Bellows  Reason for Consultation:    GERD        HPI:   Sharon Mcclure is a 80 y.o. y/o female referred for consultation & management  by Dr. Terese Door, Nino Glow, MD.   She has been referred to see me for GERD.  Previously seen by Dr. Ardis Hughs in 2016 at [facility] gastroenterology for reflux.  Has been on omeprazole 20 mg long-term for many years.  She underwent an EGD in 2016 and hiatal hernia was noted.  Empiric dilation with a 20 mm balloon was performed.  No change in the lumen of the esophagus.  CT scan of the chest with contrast in October 2021 showed no acute abnormalities the chest abdomen and pelvis.  Moderate-sized hiatal hernia was noted.  She called her doctor in January 2022 suggesting that the Protonix is not helping with her heartburn.  Hence was referred to [place].  She states that she was doing really well on her Protonix taking twice a day.  She is unsure if she was taking 20 mg twice a day or 40 mg twice a day.  But she does recollect that there was an attempt to reduce the dose of Protonix in view of the longstanding side effects associated with it.  Once she decreased the dose had recurrence of heartburn and once she resumed her previous dose all her symptoms resolved.  Denies any other complaints.  Denies any dysphagia.  Denies any weight loss.  Does have a wedge pillow but does not use it.  Past Medical History:  Diagnosis Date  . Anxiety   . CAD (coronary artery disease)   . Cancer (HCC)    basal cell-right nostril  . Fatty liver   . GERD (gastroesophageal reflux disease)   . Heart murmur   . Hemorrhoids   . Hiatal hernia   . History of skin cancer    Right arm   . HTN (hypertension)   . Hyperlipidemia   . Insomnia   . Insomnia   . Pneumonia October 2016  . Rosacea 11/24/2013  . Vitamin D deficiency     Past Surgical History:  Procedure Laterality Date  . APPENDECTOMY    . BASAL CELL CARCINOMA EXCISION     right nostril  . CATARACT EXTRACTION W/PHACO Left 01/25/2016   Procedure: CATARACT EXTRACTION PHACO AND INTRAOCULAR LENS PLACEMENT (IOC);  Surgeon: Birder Robson, MD;  Location: [facility] ORS;  Service: Ophthalmology;  Laterality: Left;  [place] 33.9 AP% 15.5 CDE 5.28 Fluid Pack Lot # [id number]  . CATARACT EXTRACTION W/PHACO Right 02/22/2016   Procedure: CATARACT EXTRACTION PHACO AND INTRAOCULAR LENS PLACEMENT (IOC);  Surgeon: Birder Robson, MD;  Location: [facility] ORS;  Service: Ophthalmology;  Laterality: Right;  [place] 00:40 AP% 23.6 CDE 9.45 fluid pack lot # [id number]  . MOHS SURGERY     bcc right nasal ala 01/2020  . TUBAL LIGATION  1981    Prior to Admission medications   Medication Sig Start Date End Date Taking? Authorizing Provider  amLODipine (NORVASC) 5 MG tablet Take 1 tablet (5 mg total) by mouth daily. 10/13/20   McLean-Scocuzza, Nino Glow, MD  aspirin 81 MG tablet Take 81 mg by mouth daily.  [provider]  Biotin 10000 MCG TABS Take 1 tablet by mouth daily.    [provider]  buPROPion (WELLBUTRIN XL) 150 MG 24 hr tablet Take 1 tablet (150 mg total) by mouth daily. 10/13/20   McLean-Scocuzza, Nino Glow, MD  Calcium Carb-Cholecalciferol (CALCIUM+D3 PO) Take 1 tablet by mouth daily.    [provider]  citalopram (CELEXA) 20 MG tablet Take 1 tablet (20 mg total) by mouth daily. 09/09/20   McLean-Scocuzza, Nino Glow, MD  cyanocobalamin 1000 MCG tablet Take 1,000 mcg by mouth daily.    [provider]  furosemide (LASIX) 20 MG tablet Take 1 tablet (20 mg total) by mouth daily as needed. 09/21/20   McLean-Scocuzza, Nino Glow, MD  lidocaine (LIDODERM) 5 % Place 1 patch onto the skin daily. Remove & Discard patch  within 12 hours or as directed by MD 06/11/20   Nuala Alpha A, PA-C  metroNIDAZOLE (METROCREAM) 0.75 % cream Apply topically 2 (two) times daily. 04/07/20   McLean-Scocuzza, Nino Glow, MD  pantoprazole (PROTONIX) 40 MG tablet Take 1 tablet (40 mg total) by mouth 2 (two) times daily before a meal. 30 minute before food 10/13/20   McLean-Scocuzza, Nino Glow, MD  REPATHA SURECLICK 443 MG/ML SOAJ INJECT 1 PEN INTO THE SKIN EVERY 14 DAYS 10/11/20   End, Harrell Gave, MD  traZODone (DESYREL) 50 MG tablet Take 1 tablet (50 mg total) by mouth at bedtime as needed for sleep. 10/13/20   McLean-Scocuzza, Nino Glow, MD  VITAMIN D, ERGOCALCIFEROL, PO Take 5,000 Units by mouth daily.    [provider]    Family History  Problem Relation Age of Onset  . Heart disease Mother 31       aortic valve- had AVR- died 6 wks after  . Hyperlipidemia Mother   . Aneurysm Father 38       aortic aneursym  . Heart disease Father   . Heart disease Brother 13       CABG  . Colon cancer Neg Hx   . Stomach cancer Neg Hx      Social History   Tobacco Use  . Smoking status: Former Smoker    Packs/day: 0.25    Years: 22.00    Pack years: 5.50    Types: Cigarettes    Quit date: 1980    Years since quitting: 42.2  . Smokeless tobacco: Never Used  Vaping Use  . Vaping Use: Never used  Substance Use Topics  . Alcohol use: No  . Drug use: No    Allergies as of 11/24/2020 - Review Complete 11/24/2020  Allergen Reaction Noted  . Ampicillin Hives, Shortness Of Breath, and Swelling 02/20/2011  . Penicillins Hives and Shortness Of Breath 02/20/2011  . Crestor [rosuvastatin calcium]  03/08/2011  . Ezetimibe-simvastatin  03/08/2011  . Niaspan [niacin er]  03/08/2011  . Vytorin [ezetimibe-simvastatin]  10/21/2012  . Zetia [ezetimibe]  03/08/2011  . Zocor [simvastatin]  03/08/2011  . Fenofibrate  03/18/2020  . Welchol [colesevelam hcl] Other (See Comments) 10/21/2012    Review of Systems:    All systems  reviewed and negative except where noted in HPI.   Physical Exam:  BP 125/76 (BP Location: Left Arm, Patient Position: Sitting, Cuff Size: Large)   Pulse 68   Ht 5' (1.524 m)   Wt 171 lb 12.8 oz (77.9 kg)   BMI 33.55 kg/m  No LMP recorded. Patient is postmenopausal. Psych:  Alert and cooperative. Normal mood and affect. General:   Alert,  Well-developed, well-nourished, pleasant and cooperative in NAD Head:  Normocephalic and atraumatic. Eyes:  Sclera clear, no icterus.   Conjunctiva pink.  Neurologic:  Alert and oriented x3;  grossly normal neurologically. Skin:  Intact without significant lesions or rashes. No jaundice. Lymph Nodes:  No significant cervical adenopathy. Psych:  Alert and cooperative. Normal mood and affect.  Imaging Studies: No results found.  Assessment and Plan:   Sharon Mcclure is a 80 y.o. y/o female has been referred for longstanding history of GERD.  Endoscopy in 2016 suggested a moderate sized hiatal hernia.  She has been doing well on a PPI for many years, unsure if she was on 20 mg twice daily of Protonix of 40 mg twice daily Protonix.  Attempt was made to reduce the dose due to associated long-term side effects.  Once the dose was decreased had recurrence of heartburn and once the dose of Protonix was increased to 40 twice daily had resolution.  Plan 1.    GERD continue lifestyle changes.  Very unlikely that she would be able to go off her proton pump inhibitor as she has a moderate sized hiatal hernia which would be the etiology of her longstanding acid reflux.  Provided patient information for acid reflux.  I suggested that since she was unsure about the dose that she has been stable on for many years, try going back to 20 mg of Protonix twice a day and ensure she is taking it first thing in the morning on an empty stomach and first thing in the evening on an empty stomach before dinner.  If the symptoms recur then go back to 40 mg twice a day.  We discussed  about the side effects and also take it for many years but also discussed the benefits of risks balance with the benefits.  Obviously in her case when we decrease the dose of the Protonix her quality of life is significantly impaired and hence would need to be on this medication long-term.  I do not believe she needs an endoscopy at this point of time as I do not see any red flag signs   Follow up in 3 months video visit to see her response to her decreasing dose of her PPI.  Dr Jonathon Bellows MD,MRCP(U.K)

## 2020-11-24 NOTE — Patient Instructions (Signed)
Gastroesophageal Reflux Disease, Adult  Gastroesophageal reflux (GER) happens when acid from the stomach flows up into the tube that connects the mouth and the stomach (esophagus). Normally, food travels down the esophagus and stays in the stomach to be digested. With GER, food and stomach acid sometimes move back up into the esophagus. You may have a disease called gastroesophageal reflux disease (GERD) if the reflux:  Happens often.  Causes frequent or very bad symptoms.  Causes problems such as damage to the esophagus. When this happens, the esophagus becomes sore and swollen. Over time, GERD can make small holes (ulcers) in the lining of the esophagus. What are the causes? This condition is caused by a problem with the muscle between the esophagus and the stomach. When this muscle is weak or not normal, it does not close properly to keep food and acid from coming back up from the stomach. The muscle can be weak because of:  Tobacco use.  Pregnancy.  Having a certain type of hernia (hiatal hernia).  Alcohol use.  Certain foods and drinks, such as coffee, chocolate, onions, and peppermint. What increases the risk?  Being overweight.  Having a disease that affects your connective tissue.  Taking NSAIDs, such a ibuprofen. What are the signs or symptoms?  Heartburn.  Difficult or painful swallowing.  The feeling of having a lump in the throat.  A bitter taste in the mouth.  Bad breath.  Having a lot of saliva.  Having an upset or bloated stomach.  Burping.  Chest pain. Different conditions can cause chest pain. Make sure you see your doctor if you have chest pain.  Shortness of breath or wheezing.  A long-term cough or a cough at night.  Wearing away of the surface of teeth (tooth enamel).  Weight loss. How is this treated?  Making changes to your diet.  Taking medicine.  Having surgery. Treatment will depend on how bad your symptoms are. Follow these  instructions at home: Eating and drinking  Follow a diet as told by your doctor. You may need to avoid foods and drinks such as: ? Coffee and tea, with or without caffeine. ? Drinks that contain alcohol. ? Energy drinks and sports drinks. ? Bubbly (carbonated) drinks or sodas. ? Chocolate and cocoa. ? Peppermint and mint flavorings. ? Garlic and onions. ? Horseradish. ? Spicy and acidic foods. These include peppers, chili powder, curry powder, vinegar, hot sauces, and BBQ sauce. ? Citrus fruit juices and citrus fruits, such as oranges, lemons, and limes. ? Tomato-based foods. These include red sauce, chili, salsa, and pizza with red sauce. ? Fried and fatty foods. These include donuts, french fries, potato chips, and high-fat dressings. ? High-fat meats. These include hot dogs, rib eye steak, sausage, ham, and bacon. ? High-fat dairy items, such as whole milk, butter, and cream cheese.  Eat small meals often. Avoid eating large meals.  Avoid drinking large amounts of liquid with your meals.  Avoid eating meals during the 2-3 hours before bedtime.  Avoid lying down right after you eat.  Do not exercise right after you eat.   Lifestyle  Do not smoke or use any products that contain nicotine or tobacco. If you need help quitting, ask your doctor.  Try to lower your stress. If you need help doing this, ask your doctor.  If you are overweight, lose an amount of weight that is healthy for you. Ask your doctor about a safe weight loss goal.   General instructions  Pay attention to any changes in your symptoms.  Take over-the-counter and prescription medicines only as told by your doctor.  Do not take aspirin, ibuprofen, or other NSAIDs unless your doctor says it is okay.  Wear loose clothes. Do not wear anything tight around your waist.  Raise (elevate) the head of your bed about 6 inches (15 cm). You may need to use a wedge to do this.  Avoid bending over if this makes your  symptoms worse.  Keep all follow-up visits. Contact a doctor if:  You have new symptoms.  You lose weight and you do not know why.  You have trouble swallowing or it hurts to swallow.  You have wheezing or a cough that keeps happening.  You have a hoarse voice.  Your symptoms do not get better with treatment. Get help right away if:  You have sudden pain in your arms, neck, jaw, teeth, or back.  You suddenly feel sweaty, dizzy, or light-headed.  You have chest pain or shortness of breath.  You vomit and the vomit is green, yellow, or black, or it looks like blood or coffee grounds.  You faint.  Your poop (stool) is red, bloody, or black.  You cannot swallow, drink, or eat. These symptoms may represent a serious problem that is an emergency. Do not wait to see if the symptoms will go away. Get medical help right away. Call your local emergency services (911 in the [place] Do not drive yourself to the hospital. Summary  If a person has gastroesophageal reflux disease (GERD), food and stomach acid move back up into the esophagus and cause symptoms or problems such as damage to the esophagus.  Treatment will depend on how bad your symptoms are.  Follow a diet as told by your doctor.  Take all medicines only as told by your doctor. This information is not intended to replace advice given to you by your health care provider. Make sure you discuss any questions you have with your health care provider. Document Revised: 02/23/2020 Document Reviewed: 02/23/2020 Elsevier Patient Education  [place].

## 2020-12-21 ENCOUNTER — Other Ambulatory Visit: Payer: Self-pay | Admitting: Internal Medicine

## 2020-12-21 DIAGNOSIS — Z1231 Encounter for screening mammogram for malignant neoplasm of breast: Secondary | ICD-10-CM

## 2021-01-12 ENCOUNTER — Ambulatory Visit
Admission: RE | Admit: 2021-01-12 | Discharge: 2021-01-12 | Disposition: A | Discharge status: Home / Self Care | Payer: Medicare Other | Source: Ambulatory Visit | Attending: Internal Medicine | Admitting: Internal Medicine

## 2021-01-12 ENCOUNTER — Other Ambulatory Visit: Payer: Medicare Other

## 2021-01-12 ENCOUNTER — Other Ambulatory Visit: Payer: Self-pay

## 2021-01-12 DIAGNOSIS — Z1231 Encounter for screening mammogram for malignant neoplasm of breast: Secondary | ICD-10-CM

## 2021-01-12 DIAGNOSIS — Z780 Asymptomatic menopausal state: Secondary | ICD-10-CM | POA: Diagnosis not present

## 2021-01-12 DIAGNOSIS — E2839 Other primary ovarian failure: Secondary | ICD-10-CM | POA: Insufficient documentation

## 2021-01-12 DIAGNOSIS — S32030D Wedge compression fracture of third lumbar vertebra, subsequent encounter for fracture with routine healing: Secondary | ICD-10-CM

## 2021-01-12 DIAGNOSIS — M47816 Spondylosis without myelopathy or radiculopathy, lumbar region: Secondary | ICD-10-CM | POA: Diagnosis not present

## 2021-01-12 DIAGNOSIS — M8588 Other specified disorders of bone density and structure, other site: Secondary | ICD-10-CM | POA: Diagnosis not present

## 2021-02-22 ENCOUNTER — Other Ambulatory Visit: Payer: Self-pay

## 2021-02-22 ENCOUNTER — Encounter (HOSPITAL_COMMUNITY): Payer: Self-pay | Admitting: Student in an Organized Health Care Education/Training Program

## 2021-02-22 ENCOUNTER — Inpatient Hospital Stay (HOSPITAL_COMMUNITY): Payer: Medicare Other

## 2021-02-22 ENCOUNTER — Observation Stay (HOSPITAL_COMMUNITY)
Admission: EM | Admit: 2021-02-22 | Discharge: 2021-02-23 | Disposition: A | Discharge status: Home / Self Care | Payer: Medicare Other | Attending: Neurology | Admitting: Neurology

## 2021-02-22 ENCOUNTER — Emergency Department (HOSPITAL_COMMUNITY): Payer: Medicare Other

## 2021-02-22 DIAGNOSIS — I639 Cerebral infarction, unspecified: Secondary | ICD-10-CM | POA: Diagnosis not present

## 2021-02-22 DIAGNOSIS — I10 Essential (primary) hypertension: Secondary | ICD-10-CM | POA: Diagnosis not present

## 2021-02-22 DIAGNOSIS — Y900 Blood alcohol level of less than 20 mg/100 ml: Secondary | ICD-10-CM | POA: Insufficient documentation

## 2021-02-22 DIAGNOSIS — Z79899 Other long term (current) drug therapy: Secondary | ICD-10-CM | POA: Diagnosis not present

## 2021-02-22 DIAGNOSIS — M47812 Spondylosis without myelopathy or radiculopathy, cervical region: Secondary | ICD-10-CM | POA: Diagnosis not present

## 2021-02-22 DIAGNOSIS — R2681 Unsteadiness on feet: Secondary | ICD-10-CM | POA: Diagnosis not present

## 2021-02-22 DIAGNOSIS — Z7982 Long term (current) use of aspirin: Secondary | ICD-10-CM | POA: Insufficient documentation

## 2021-02-22 DIAGNOSIS — D320 Benign neoplasm of cerebral meninges: Secondary | ICD-10-CM | POA: Diagnosis not present

## 2021-02-22 DIAGNOSIS — I2510 Atherosclerotic heart disease of native coronary artery without angina pectoris: Secondary | ICD-10-CM | POA: Insufficient documentation

## 2021-02-22 DIAGNOSIS — Z85828 Personal history of other malignant neoplasm of skin: Secondary | ICD-10-CM | POA: Insufficient documentation

## 2021-02-22 DIAGNOSIS — Z20822 Contact with and (suspected) exposure to covid-19: Secondary | ICD-10-CM | POA: Diagnosis not present

## 2021-02-22 DIAGNOSIS — I63233 Cerebral infarction due to unspecified occlusion or stenosis of bilateral carotid arteries: Secondary | ICD-10-CM | POA: Diagnosis not present

## 2021-02-22 DIAGNOSIS — R2990 Unspecified symptoms and signs involving the nervous system: Secondary | ICD-10-CM | POA: Diagnosis present

## 2021-02-22 DIAGNOSIS — Z87891 Personal history of nicotine dependence: Secondary | ICD-10-CM | POA: Insufficient documentation

## 2021-02-22 DIAGNOSIS — R413 Other amnesia: Secondary | ICD-10-CM | POA: Diagnosis not present

## 2021-02-22 DIAGNOSIS — I672 Cerebral atherosclerosis: Secondary | ICD-10-CM | POA: Diagnosis not present

## 2021-02-22 LAB — I-STAT CHEM 8, ED
BUN: 24 mg/dL — ABNORMAL HIGH (ref 8–23)
Calcium, Ion: 1.1 mmol/L — ABNORMAL LOW (ref 1.15–1.40)
Chloride: 104 mmol/L (ref 98–111)
Creatinine, Ser: 0.9 mg/dL (ref 0.44–1.00)
Glucose, Bld: 117 mg/dL — ABNORMAL HIGH (ref 70–99)
HCT: 38 % (ref 36.0–46.0)
Hemoglobin: 12.9 g/dL (ref 12.0–15.0)
Potassium: 4.2 mmol/L (ref 3.5–5.1)
Sodium: 139 mmol/L (ref 135–145)
TCO2: 26 mmol/L (ref 22–32)

## 2021-02-22 LAB — CBC
HCT: 40.2 % (ref 36.0–46.0)
Hemoglobin: 12.5 g/dL (ref 12.0–15.0)
MCH: 26.7 pg (ref 26.0–34.0)
MCHC: 31.1 g/dL (ref 30.0–36.0)
MCV: 85.7 fL (ref 80.0–100.0)
Platelets: 288 10*3/uL (ref 150–400)
RBC: 4.69 MIL/uL (ref 3.87–5.11)
RDW: 14.7 % (ref 11.5–15.5)
WBC: 8.5 10*3/uL (ref 4.0–10.5)
nRBC: 0 % (ref 0.0–0.2)

## 2021-02-22 LAB — COMPREHENSIVE METABOLIC PANEL
ALT: 20 U/L (ref 0–44)
AST: 23 U/L (ref 15–41)
Albumin: 3.7 g/dL (ref 3.5–5.0)
Alkaline Phosphatase: 47 U/L (ref 38–126)
Anion gap: 9 (ref 5–15)
BUN: 22 mg/dL (ref 8–23)
CO2: 26 mmol/L (ref 22–32)
Calcium: 9.3 mg/dL (ref 8.9–10.3)
Chloride: 104 mmol/L (ref 98–111)
Creatinine, Ser: 0.93 mg/dL (ref 0.44–1.00)
GFR, Estimated: >60 mL/min (ref >60)
Glucose, Bld: 117 mg/dL — ABNORMAL HIGH (ref 70–99)
Potassium: 4.3 mmol/L (ref 3.5–5.1)
Sodium: 139 mmol/L (ref 135–145)
Total Bilirubin: 0.5 mg/dL (ref 0.3–1.2)
Total Protein: 6.5 g/dL (ref 6.5–8.1)

## 2021-02-22 LAB — URINALYSIS, ROUTINE W REFLEX MICROSCOPIC
Bilirubin Urine: NEGATIVE
Glucose, UA: NEGATIVE mg/dL
Hgb urine dipstick: NEGATIVE
Ketones, ur: NEGATIVE mg/dL
Leukocytes,Ua: NEGATIVE
Nitrite: NEGATIVE
Protein, ur: NEGATIVE mg/dL
Specific Gravity, Urine: 1.043 — ABNORMAL HIGH (ref 1.005–1.030)
pH: 6 (ref 5.0–8.0)

## 2021-02-22 LAB — DIFFERENTIAL
Abs Immature Granulocytes: 0.02 10*3/uL (ref 0.00–0.07)
Basophils Absolute: 0.1 10*3/uL (ref 0.0–0.1)
Basophils Relative: 1 %
Eosinophils Absolute: 0.3 10*3/uL (ref 0.0–0.5)
Eosinophils Relative: 3 %
Immature Granulocytes: 0 %
Lymphocytes Relative: 34 %
Lymphs Abs: 2.9 10*3/uL (ref 0.7–4.0)
Monocytes Absolute: 0.8 10*3/uL (ref 0.1–1.0)
Monocytes Relative: 10 %
Neutro Abs: 4.4 10*3/uL (ref 1.7–7.7)
Neutrophils Relative %: 52 %

## 2021-02-22 LAB — RESP PANEL BY RT-PCR (FLU A&B, COVID) ARPGX2
Influenza A by PCR: NEGATIVE
Influenza B by PCR: NEGATIVE
SARS Coronavirus 2 by RT PCR: NEGATIVE

## 2021-02-22 LAB — PROTIME-INR
INR: 1 (ref 0.8–1.2)
Prothrombin Time: 13.2 seconds (ref 11.4–15.2)

## 2021-02-22 LAB — RAPID URINE DRUG SCREEN, HOSP PERFORMED
Amphetamines: NOT DETECTED
Barbiturates: NOT DETECTED
Benzodiazepines: NOT DETECTED
Cocaine: NOT DETECTED
Opiates: NOT DETECTED
Tetrahydrocannabinol: POSITIVE — AB

## 2021-02-22 LAB — MRSA NEXT GEN BY PCR, NASAL: MRSA by PCR Next Gen: NOT DETECTED

## 2021-02-22 LAB — ETHANOL: Alcohol, Ethyl (B): <10 mg/dL (ref <10)

## 2021-02-22 LAB — APTT: aPTT: 24 seconds (ref 24–36)

## 2021-02-22 LAB — CBG MONITORING, ED: Glucose-Capillary: 113 mg/dL — ABNORMAL HIGH (ref 70–99)

## 2021-02-22 MED ORDER — IOHEXOL 350 MG/ML SOLN
60.0000 mL | Freq: Once | INTRAVENOUS | Status: AC | PRN
  Administered 2021-02-22: 60 mL via INTRAVENOUS

## 2021-02-22 MED ORDER — ACETAMINOPHEN 650 MG RE SUPP: 650.0000 mg | RECTAL | Status: DC | PRN

## 2021-02-22 MED ORDER — ALTEPLASE (STROKE) FULL DOSE INFUSION
0.9000 mg/kg | Freq: Once | INTRAVENOUS | Status: AC
  Administered 2021-02-22: 68.8 mg via INTRAVENOUS

## 2021-02-22 MED ORDER — PANTOPRAZOLE SODIUM 40 MG IV SOLR
40.0000 mg | Freq: Every day | INTRAVENOUS | Status: DC
  Administered 2021-02-22: 40 mg via INTRAVENOUS
  Filled 2021-02-22: qty 40

## 2021-02-22 MED ORDER — SODIUM CHLORIDE 0.9 % IV SOLN: INTRAVENOUS | Status: DC

## 2021-02-22 MED ORDER — SODIUM CHLORIDE 0.9 % IV SOLN
50.0000 mL | Freq: Once | INTRAVENOUS | Status: AC
  Administered 2021-02-22: 50 mL via INTRAVENOUS

## 2021-02-22 MED ORDER — ACETAMINOPHEN 325 MG PO TABS: 650.0000 mg | ORAL_TABLET | ORAL | Status: DC | PRN

## 2021-02-22 MED ORDER — ALTEPLASE (STROKE) FULL DOSE INFUSION: 0.9000 mg/kg | Freq: Once | INTRAVENOUS | Status: DC

## 2021-02-22 MED ORDER — ACETAMINOPHEN 160 MG/5ML PO SOLN: 650.0000 mg | ORAL | Status: DC | PRN

## 2021-02-22 MED ORDER — SENNOSIDES-DOCUSATE SODIUM 8.6-50 MG PO TABS: 1.0000 | ORAL_TABLET | Freq: Every evening | ORAL | Status: DC | PRN

## 2021-02-22 MED ORDER — SODIUM CHLORIDE 0.9 % IV SOLN: 50.0000 mL | Freq: Once | INTRAVENOUS | Status: DC

## 2021-02-22 MED ORDER — STROKE: EARLY STAGES OF RECOVERY BOOK
Freq: Once | Status: DC
  Filled 2021-02-22 (×2): qty 1

## 2021-02-22 NOTE — H&P (Signed)
Neurology H&P  Reason for Consult: Aphasia, memory problems Referring Physician: Dr. Vanita Panda  CC: Word-finding difficulty, trouble with balance and gait coordination  History is obtained from: Patient, chart review, patient's daughter at bedside, and patient's son via telephone  HPI: Sharon Mcclure is a 80 y.o. female with a medical history significant for coronary artery disease on ASA 81 mg daily, hypertension, hyperlipidemia, anxiety, and vitamin D deficiency who presented to the ED for evaluation of acute onset of aphasia and an incoordinated gait. Details about presenting complaints are slightly limited by patient's loss of fluency in conversation but daughter at bedside states that Sharon Mcclure called her stating that she needed help and that she was unable to verbalize the help that she needed. Her daughter states that it took some time to communicate with her mother due to word finding difficulties. Her son states that he had been with Sharon Mcclure from noon until 1PM and that she was at her baseline functioning and had made him lunch today before he left the house. Sharon Mcclure recalls sitting at the table, feeling nauseated, and "off" at the time that she called her daughter to ask for help.  At baseline, she lives with her son who helps her keep up the house and chores but is able to ambulate independently without assistive device. She does not have any known memory problems and does not have a known history of stroke. Of note, Sharon Mcclure did sustain a fall in the yard on Monday, February 14, 2021 with scattered abrasions on her left cheek and left forearm.   LKW: 13:00 tpa given?: yes, initially offered at 16:33, administration delayed due to difficulties determining LKW time via telephone with connection issues, establishing IV access. Administered at 16:41 IR Thrombectomy? No, presentation not consistent with LVO Modified Rankin Scale: 1-No significant post stroke disability and can  perform usual duties with stroke symptoms  ROS: A complete ROS was performed and is negative except as noted in the HPI.   Past Medical History:  Diagnosis Date   Anxiety    CAD (coronary artery disease)    Cancer ([place])    basal cell-right nostril   Fatty liver    GERD (gastroesophageal reflux disease)    Heart murmur    Hemorrhoids    Hiatal hernia    History of skin cancer    Right arm   HTN (hypertension)    Hyperlipidemia    Insomnia    Insomnia    Pneumonia October 2016   Rosacea 11/24/2013   Vitamin D deficiency    Past Surgical History:  Procedure Laterality Date   APPENDECTOMY     BASAL CELL CARCINOMA EXCISION     right nostril   CATARACT EXTRACTION W/PHACO Left 01/25/2016   Procedure: CATARACT EXTRACTION PHACO AND INTRAOCULAR LENS PLACEMENT ([place]);  Surgeon: Birder Robson, MD;  Location: [facility] ORS;  Service: Ophthalmology;  Laterality: Left;  [place] 33.9 AP% 15.5 CDE 5.28 Fluid Pack Lot # [id number]   CATARACT EXTRACTION W/PHACO Right 02/22/2016   Procedure: CATARACT EXTRACTION PHACO AND INTRAOCULAR LENS PLACEMENT ([facility]);  Surgeon: Birder Robson, MD;  Location: [facility] ORS;  Service: Ophthalmology;  Laterality: Right;  [place] 00:40 AP% 23.6 CDE 9.45 fluid pack lot # [id number]   MOHS SURGERY     bcc right nasal ala 01/2020   TUBAL LIGATION  1981   Family History  Problem Relation Age of Onset   Heart disease Mother 17       aortic valve- had  AVR- died 74 wks after   Hyperlipidemia Mother    Aneurysm Father 41       aortic aneursym   Heart disease Father    Heart disease Brother 50       CABG   Colon cancer Neg Hx    Stomach cancer Neg Hx    Social History:   reports that she quit smoking about 42 years ago. Her smoking use included cigarettes. She has a 5.50 pack-year smoking history. She has never used smokeless tobacco. She reports that she does not drink alcohol and does not use drugs.  Medications  Current Facility-Administered Medications:     stroke:  mapping our early stages of recovery book, , Does not apply, Once, Toberman, Stevi W, NP   alteplase (ACTIVASE) 1 mg/mL infusion 68.8 mg, 0.9 mg/kg (Order-Specific), Intravenous, Once **FOLLOWED BY** 0.9 %  sodium chloride infusion, 50 mL, Intravenous, Once, Heloise Purpura, RPH   0.9 %  sodium chloride infusion, , Intravenous, Continuous, Toberman, Stevi W, NP   acetaminophen (TYLENOL) tablet 650 mg, 650 mg, Oral, Q4H PRN **OR** acetaminophen (TYLENOL) 160 MG/5ML solution 650 mg, 650 mg, Per Tube, Q4H PRN **OR** acetaminophen (TYLENOL) suppository 650 mg, 650 mg, Rectal, Q4H PRN, Ebbie Latus, Stevi W, NP   pantoprazole (PROTONIX) injection 40 mg, 40 mg, Intravenous, QHS, Toberman, Stevi W, NP   senna-docusate (Senokot-S) tablet 1 tablet, 1 tablet, Oral, QHS PRN, Rikki Spearing, NP  Current Outpatient Medications:    amLODipine (NORVASC) 5 MG tablet, Take 1 tablet (5 mg total) by mouth daily., Disp: 90 tablet, Rfl: 3   aspirin 81 MG tablet, Take 81 mg by mouth daily., Disp: , Rfl:    Biotin 10000 MCG TABS, Take 1 tablet by mouth daily., Disp: , Rfl:    buPROPion (WELLBUTRIN XL) 150 MG 24 hr tablet, Take 1 tablet (150 mg total) by mouth daily., Disp: 90 tablet, Rfl: 3   Calcium Carb-Cholecalciferol (CALCIUM+D3 PO), Take 1 tablet by mouth daily., Disp: , Rfl:    citalopram (CELEXA) 20 MG tablet, Take 1 tablet (20 mg total) by mouth daily., Disp: 90 tablet, Rfl: 3   cyanocobalamin 1000 MCG tablet, Take 1,000 mcg by mouth daily., Disp: , Rfl:    furosemide (LASIX) 20 MG tablet, Take 1 tablet (20 mg total) by mouth daily as needed., Disp: 30 tablet, Rfl: 5   lidocaine (LIDODERM) 5 %, Place 1 patch onto the skin daily. Remove & Discard patch within 12 hours or as directed by MD, Disp: 15 patch, Rfl: 0   metroNIDAZOLE (METROCREAM) 0.75 % cream, Apply topically 2 (two) times daily., Disp: 45 g, Rfl: 11   pantoprazole (PROTONIX) 40 MG tablet, Take 1 tablet (40 mg total) by mouth 2 (two) times daily  before a meal. 30 minute before food, Disp: 180 tablet, Rfl: 3   REPATHA SURECLICK 737 MG/ML SOAJ, INJECT 1 PEN INTO THE SKIN EVERY 14 DAYS, Disp: 2 mL, Rfl: 4   traZODone (DESYREL) 50 MG tablet, Take 1 tablet (50 mg total) by mouth at bedtime as needed for sleep., Disp: 90 tablet, Rfl: 3   VITAMIN D, ERGOCALCIFEROL, PO, Take 5,000 Units by mouth daily., Disp: , Rfl:   Exam: Current vital signs: BP (!) 137/59   Pulse 71   SpO2 95%  Vital signs in last 24 hours: Pulse Rate:  [71] 71 (06/28 1630) BP: (137)/(59) 137/59 (06/28 1630) SpO2:  [95 %] 95 % (06/28 1630)  GENERAL: Awake, alert, in no acute distress Head:  Normocephalic, scattered abrasions on left cheek from fall 02/14/2021 EENT: Dry mm, no OP obstruction, normal conjunctive LUNGS: Normal respiratory effort, non-labored breathing CV: Regular rate on telemetry, extremities warm without edema ABDOMEN - Soft, non-tender, non-distended Ext: warm, well perfused, no obvious deformity  NEURO:  Mental Status: Awake, alert, and oriented to self, age, month, year, and situation.  She is able to provide some details regarding history of present illness but does have some trouble with word finding and fluency in conversation.  Speech is intact without dysarthria or aphasia.  Fluency is limited with some complications with word-finding but this is subtle throughout conversation.  Naming, repetition, and comprehension are intact.  No neglect noted.  Cranial Nerves:  II: PERRL 3 mm/brisk. Visual fields full.  III, IV, VI: EOMI without ptosis, no gaze preference noted.  V: Sensation is intact to light touch and symmetrical to face.  VII: Face is symmetric resting and smiling. VIII: Hearing is intact to voice IX, X: Palate elevation is symmetric. Phonation normal.  XI: Normal sternocleidomastoid and trapezius muscle strength XII: Tongue protrudes midline without fasciculations.   Motor: 5/5 strength present in bilateral upper and lower  extremities without asymmetry. Each extremity is able to withstand antigravity movement without vertical drift.  Tone is normal. Bulk is normal.  Sensation: Intact to light touch bilaterally in all four extremities. No extinction to DSS present.  Coordination: Upper and lower extremity movements are ataxic with left upper extremity ataxia > right upper extremity ataxia Gait: Deferred  NIHSS: 1a Level of Conscious.: 0 1b LOC Questions: 0 1c LOC Commands: 0 2 Best Gaze: 0 3 Visual: 0 4 Facial Palsy: 0 5a Motor Arm - left: 0 5b Motor Arm - Right: 0 6a Motor Leg - Left: 0 6b Motor Leg - Right: 0 7 Limb Ataxia: 2 8 Sensory: 0 9 Best Language: 1 10 Dysarthria: 0 11 Extinct. and Inatten.: 0 TOTAL: 3  Labs I have reviewed labs in epic and the results pertinent to this consultation are: CBC    Component Value Date/Time   WBC 8.5 02/22/2021 1606   RBC 4.69 02/22/2021 1606   HGB 12.9 02/22/2021 1616   HGB 14.6 04/01/2012 2126   HCT 38.0 02/22/2021 1616   HCT 42.1 04/01/2012 2126   PLT 288 02/22/2021 1606   PLT 229 04/01/2012 2126   MCV 85.7 02/22/2021 1606   MCV 88 04/01/2012 2126   MCH 26.7 02/22/2021 1606   MCHC 31.1 02/22/2021 1606   RDW 14.7 02/22/2021 1606   RDW 13.8 04/01/2012 2126   LYMPHSABS 2.9 02/22/2021 1606   MONOABS 0.8 02/22/2021 1606   EOSABS 0.3 02/22/2021 1606   BASOSABS 0.1 02/22/2021 1606   CMP     Component Value Date/Time   NA 139 02/22/2021 1616   NA 140 04/01/2012 2126   K 4.2 02/22/2021 1616   K 3.6 04/01/2012 2126   CL 104 02/22/2021 1616   CL 102 04/01/2012 2126   CO2 27 06/11/2020 1034   CO2 31 04/01/2012 2126   GLUCOSE 117 (H) 02/22/2021 1616   GLUCOSE 95 04/01/2012 2126   BUN 24 (H) 02/22/2021 1616   BUN 22 (H) 04/01/2012 2126   CREATININE 0.90 02/22/2021 1616   CREATININE 0.77 05/13/2018 1012   CALCIUM 9.6 06/11/2020 1034   CALCIUM 9.4 04/01/2012 2126   PROT 6.9 04/07/2020 1014   PROT 7.4 04/01/2012 2126   ALBUMIN 4.4 04/07/2020  1014   ALBUMIN 3.9 04/01/2012 2126   AST 26 04/07/2020  1014   AST 37 04/01/2012 2126   ALT 19 04/07/2020 1014   ALT 36 04/01/2012 2126   ALKPHOS 39 04/07/2020 1014   ALKPHOS 78 04/01/2012 2126   BILITOT 0.5 04/07/2020 1014   BILITOT 0.4 04/01/2012 2126   GFRNONAA >60 06/11/2020 1034   GFRNONAA 76 05/13/2018 1012   GFRAA 88 05/13/2018 1012   Lipid Panel     Component Value Date/Time   CHOL 122 04/07/2020 1014   CHOL 196 09/17/2019 1413   TRIG 123.0 04/07/2020 1014   HDL 53.10 04/07/2020 1014   HDL 55 09/17/2019 1413   CHOLHDL 2 04/07/2020 1014   VLDL 24.6 04/07/2020 1014   LDLCALC 45 04/07/2020 1014   LDLCALC 112 (H) 09/17/2019 1413   LDLCALC 203 (H) 05/13/2018 1012   Lab Results  Component Value Date   HGBA1C 5.7 04/07/2020   Imaging I have reviewed the images obtained:  CT-scan of the brain 02/22/2021: 1. No acute abnormality 2. ASPECTS is 10 3. Frontal atrophy with mild chronic microvascular ischemic change 4. 7 mm calcified meningioma right parietal lobe.  CT angio head and neck 02/22/2021:   Assessment: 80 y.o. female with PMHx of HTN, HLD, fatty liver disease, anxiety, and CAD who presented to the ED with acute onset of word-finding difficulty, ataxia, and unsteady gait. Last known well at 13:00 when seen by her son. - Examination reveals patient with subtle loss of fluency in conversation and subtle aphasia with word-finding difficulties. She was also found to be ataxic in her upper and lower extremities with an unsteady gait that is not per baseline per daughter at bedside. NIHSS of 3 initially.  - CT imaging was obtained without evidence of acute abnormality. CT imaging reviewed without evidence of hemorrhage.  - Once back in patient room in the ER, last known well time was established via telephone call with patient's son. Time frame clarified within the thrombolytic therapy window and the risks versus benefits of treatment and tPA checklist were covered with  family. Patient and family consented to tPA at 16:33, LKW established at 16:32. tPA initiated at 16:41. - Further stroke work up pending at this time. Etiology possibly cardioembolic versus atherosclerotic. Vessel imaging and MRI brain pending.  - Stroke risk factors include patient age, history of HTN, HLD, and CAD  Impression: Acute stroke determined by clinical assessment s/p tPA administration History of CAD on ASA  Plan:  Acute Ischemic Stroke determined by clinical assessment Cerebral atherosclerosis  Acuity: Acute Current Suspected Etiology: Cardioembolic versus atherosclerotic Continue Evaluation:  -Admit to: ICU -Hold Aspirin until 24 hour post tPA neuroimaging is stable and without evidence of bleeding -Blood pressure control, goal of SYS < 180 -MRI/ECHO/A1C/Lipid panel. -Hyperglycemia management per SSI to maintain glucose 140-180mg /dL. -PT/OT/ST therapies and recommendations when able  CNS Acute Ataxia -PT/OT  RESP -Maintain SpO2 > 92% -Supplemental O2 as needed  CV Essential (primary) hypertension -Aggressive BP control, goal SBP < 180 -Titrate oral agents  Hyperlipidemia, unspecified  - Statin for goal LDL < 70  HEME tPA administered -Monitor AM CBC -Transfuse for hgb < 7  ENDO No known history of diabetes mellitus -SSI as needed for goal blood glucose 140-180 -Start oral meds -goal HgbA1c < 7%  GI/GU -Gentle hydration IVF @ 50 cc/h while NPO  Fluid/Electrolyte Disorders -Trend- AM CMP -Replete as needed, repeat labs  ID Possible UTI -UA pending  Possible aspiration pneumonia -check chest x-ray  Nutrition E66.9 Obesity   Prophylaxis DVT:  SCDs GI: PPI  Bowel: Docusate / Senna  Diet: NPO until cleared by speech  Code Status: Full Code    THE FOLLOWING WERE PRESENT ON ADMISSION: CNS -  Acute Ischemic Stroke, Ataxia Respiratory - N/A Cardiovascular - Essential hypertension Infectious -possible UTI and aspiration pneumonia GI -  N/A Renal -  N/A Heme-  N/A Cancer - Basal cell carcinoma of left nostril Trauma - N/A  Anibal Henderson, AGAC-NP [facility] Pager: [phone]   Attending Neurohospitalist Addendum Patient seen and examined with APP/Resident. Agree with the history and physical as documented above. Presenting with sudden onset of gait difficulty and word finding difficulty. On examination, NIH-3 but disturbing symptoms. Risk benefits for IV tPA discussed with the daughter, contraindications reviewed-no contraindications-daughter agreed to proceed with tPA administration. Likely stroke versus stroke mimic status post tPA. Will be a good candidate for optimist main trial if remains low NIH. Will reevaluate within 2 hours of tPA. Stroke team to continue to follow.   CRITICAL CARE ATTESTATION Performed by: Amie Portland, MD Total critical care time: 40 minutes Critical care time was exclusive of separately billable procedures and treating other patients and/or supervising APPs/Residents/Students Critical care was necessary to treat or prevent imminent or life-threatening deterioration due to strokelike symptoms, IV thrombolysis This patient is critically ill and at significant risk for neurological worsening and/or death and care requires constant monitoring. Critical care was time spent personally by me on the following activities: development of treatment plan with patient and/or surrogate as well as nursing, discussions with consultants, evaluation of patient's response to treatment, examination of patient, obtaining history from patient or surrogate, ordering and performing treatments and interventions, ordering and review of laboratory studies, ordering and review of radiographic studies, pulse oximetry, re-evaluation of patient's condition, participation in multidisciplinary rounds and medical decision making of high complexity in the care of this patient.  -- Amie Portland,  MD Neurologist [facility] Pager: [phone]

## 2021-02-22 NOTE — Plan of Care (Signed)
Passed [employer] Screen.

## 2021-02-22 NOTE — ED Notes (Signed)
Pt to CT with Merrily Pew, RN

## 2021-02-22 NOTE — Progress Notes (Signed)
NIH for Optimist eligibility  NIHSS  1a Level of Conscious.: 0 1b LOC Questions: 0 1c LOC Commands: 0 2 Best Gaze: 0 3 Visual: 0 4 Facial Palsy: 0 5a Motor Arm - left: 0 5b Motor Arm - Right: 0 6a Motor Leg - Left: 0 6b Motor Leg - Right: 0 7 Limb Ataxia: 0 8 Sensory: 0 9 Best Language: 0 10 Dysarthria: 0 11 Extinct. and Inatten.: 0 TOTAL: 0  Candidate for OPTIMIST Orderset placed for 1841 hrs start time  -- Amie Portland, MD Neurologist [facility] Pager: [phone]

## 2021-02-22 NOTE — ED Triage Notes (Signed)
C/o vision changes, aphasia, and memory loss that started at 3pm.  Pt fell and hit her head yesterday.  No blood thinners.  Code Stroke activated from triage.

## 2021-02-22 NOTE — Code Documentation (Addendum)
Stroke Response Nurse Documentation Code Documentation  Sharon Mcclure is a 80 y.o. female arriving to [place] ED via Private Vehicle on 02/22/2021 with past medical hx of HLP, HTN. Code stroke was activated by ED. Patient from home where she was LKW at 1300 when her son was eating lunch with her and now complaining of trouble getting her words out and walking.  Stroke team at the bedside on patient arrival. Labs drawn and patient cleared for CT by Dr. Vanita Panda. Patient to CT with team. NIHSS 3, see documentation for details and code stroke times. Patient with bilateral limb ataxia and Expressive aphasia  on exam. The following imaging was completed:  (CT, CTA head and neck. Patient is a candidate for tPA and it was started once confirmed LKW with daughter in room. Two IVs placed, BP checked, CBG drawn, and family consented with Dr. Rory Percy. See MAR for tPA start time.   Care/Plan: tPA given. Post-tPA Protocol. Q15 mNIHSS/VS x 2 hours and then call Neurologist for further orders. BP < 180/105. Report given to [place].  Kathrin Greathouse  Stroke Response RN

## 2021-02-22 NOTE — ED Provider Notes (Signed)
[facility] EMERGENCY DEPARTMENT Provider Note   CSN: [id number] Arrival date & time: 02/22/21  1604     History Chief Complaint  Patient presents with   Code Stroke    Sharon Mcclure is a 80 y.o. female.  HPI Patient presents as a code stroke.  She was seen and evaluated in triage, and with concern for new ataxia, dysarthria designated as a code stroke and evaluation was conducted expeditiously. Level 5 caveat secondary to of condition.  Details are obtained with the patient, her daughter, and our neurology colleagues.  In essence, patient with history of CAD, hypertension, hyperlipidemia presented after acute onset aphasia, difficulty with ambulation.  Onset seems to be within the past 3 hours.  Patient did have a fall about a week ago, but that seems unremarkable and she was seemingly in her normal state of health until this more recent change.     Past Medical History:  Diagnosis Date   Anxiety    CAD (coronary artery disease)    Cancer ([place])    basal cell-right nostril   Fatty liver    GERD (gastroesophageal reflux disease)    Heart murmur    Hemorrhoids    Hiatal hernia    History of skin cancer    Right arm   HTN (hypertension)    Hyperlipidemia    Insomnia    Insomnia    Pneumonia October 2016   Rosacea 11/24/2013   Vitamin D deficiency     Patient Active Problem List   Diagnosis Date Noted   DDD (degenerative disc disease), lumbar 10/15/2020   Compression fracture of L3 vertebra ([place]) 10/15/2020   Overactive bladder 10/13/2020   Annual physical exam 10/13/2020   PVC's (premature ventricular contractions) 09/23/2020   Basal cell carcinoma (BCC) of skin of nose 04/07/2020   Dizziness 03/18/2020   Statin myopathy 56/38/9373   LSC (lichen simplex chronicus) 05/30/2019   Dyspnea on exertion 05/29/2019   Coronary artery calcification 05/29/2019   Abnormal EKG 05/29/2019   Osteopenia 03/05/2019   Coronary artery disease involving  native coronary artery of native heart without angina pectoris    Thoracic compression fracture ([place]) 01/08/2015   Pain in thoracic spine 01/08/2015   Rosacea 11/24/2013   Vitamin D deficiency 09/29/2013   GERD (gastroesophageal reflux disease)    Vitamin D deficiency    History of skin cancer    Anxiety    Hyperlipidemia LDL goal <70    Essential hypertension    Left groin pain 02/20/2011    Past Surgical History:  Procedure Laterality Date   APPENDECTOMY     BASAL CELL CARCINOMA EXCISION     right nostril   CATARACT EXTRACTION W/PHACO Left 01/25/2016   Procedure: CATARACT EXTRACTION PHACO AND INTRAOCULAR LENS PLACEMENT ([place]);  Surgeon: Birder Robson, MD;  Location: [facility] ORS;  Service: Ophthalmology;  Laterality: Left;  [place] 33.9 AP% 15.5 CDE 5.28 Fluid Pack Lot # [id number]   CATARACT EXTRACTION W/PHACO Right 02/22/2016   Procedure: CATARACT EXTRACTION PHACO AND INTRAOCULAR LENS PLACEMENT (IOC);  Surgeon: Birder Robson, MD;  Location: [facility] ORS;  Service: Ophthalmology;  Laterality: Right;  [place] 00:40 AP% 23.6 CDE 9.45 fluid pack lot # [id number]   MOHS SURGERY     bcc right nasal ala 01/2020   TUBAL LIGATION  1981     OB History   No obstetric history on file.     Family History  Problem Relation Age of Onset   Heart disease Mother 28  aortic valve- had AVR- died 68 wks after   Hyperlipidemia Mother    Aneurysm Father 6       aortic aneursym   Heart disease Father    Heart disease Brother 44       CABG   Colon cancer Neg Hx    Stomach cancer Neg Hx     Social History   Tobacco Use   Smoking status: Former    Packs/day: 0.25    Years: 22.00    Pack years: 5.50    Types: Cigarettes    Quit date: 1980    Years since quitting: 42.5   Smokeless tobacco: Never  Vaping Use   Vaping Use: Never used  Substance Use Topics   Alcohol use: No   Drug use: No    Home Medications Prior to Admission medications   Medication Sig Start Date End Date Taking?  Authorizing Provider  amLODipine (NORVASC) 5 MG tablet Take 1 tablet (5 mg total) by mouth daily. 10/13/20   McLean-Scocuzza, Nino Glow, MD  aspirin 81 MG tablet Take 81 mg by mouth daily.    [provider]  Biotin 10000 MCG TABS Take 1 tablet by mouth daily.    [provider]  buPROPion (WELLBUTRIN XL) 150 MG 24 hr tablet Take 1 tablet (150 mg total) by mouth daily. 10/13/20   McLean-Scocuzza, Nino Glow, MD  Calcium Carb-Cholecalciferol (CALCIUM+D3 PO) Take 1 tablet by mouth daily.    [provider]  citalopram (CELEXA) 20 MG tablet Take 1 tablet (20 mg total) by mouth daily. 09/09/20   McLean-Scocuzza, Nino Glow, MD  cyanocobalamin 1000 MCG tablet Take 1,000 mcg by mouth daily.    [provider]  furosemide (LASIX) 20 MG tablet Take 1 tablet (20 mg total) by mouth daily as needed. 09/21/20   McLean-Scocuzza, Nino Glow, MD  lidocaine (LIDODERM) 5 % Place 1 patch onto the skin daily. Remove & Discard patch within 12 hours or as directed by MD 06/11/20   Nuala Alpha A, PA-C  metroNIDAZOLE (METROCREAM) 0.75 % cream Apply topically 2 (two) times daily. 04/07/20   McLean-Scocuzza, Nino Glow, MD  pantoprazole (PROTONIX) 40 MG tablet Take 1 tablet (40 mg total) by mouth 2 (two) times daily before a meal. 30 minute before food 10/13/20   McLean-Scocuzza, Nino Glow, MD  REPATHA SURECLICK 654 MG/ML SOAJ INJECT 1 PEN INTO THE SKIN EVERY 14 DAYS 10/11/20   End, Harrell Gave, MD  traZODone (DESYREL) 50 MG tablet Take 1 tablet (50 mg total) by mouth at bedtime as needed for sleep. 10/13/20   McLean-Scocuzza, Nino Glow, MD  VITAMIN D, ERGOCALCIFEROL, PO Take 5,000 Units by mouth daily.    [provider]    Allergies    Ampicillin, Penicillins, Crestor [rosuvastatin calcium], Ezetimibe-simvastatin, Niaspan [niacin er], Vytorin [ezetimibe-simvastatin], Zetia [ezetimibe], Zocor [simvastatin], Fenofibrate, and Welchol [colesevelam hcl]  Review of Systems   Review of Systems   Unable to perform ROS: Acuity of condition   Physical Exam Updated Vital Signs BP (!) 137/59   Pulse 71   SpO2 95%   Physical Exam Vitals and nursing note reviewed.  Constitutional:      General: She is not in acute distress.    Appearance: She is well-developed.  HENT:     Head: Normocephalic.   Eyes:     Conjunctiva/sclera: Conjunctivae normal.  Cardiovascular:     Rate and Rhythm: Normal rate and regular rhythm.  Pulmonary:     Effort: Pulmonary effort is normal.  No respiratory distress.     Breath sounds: Normal breath sounds. No stridor.  Abdominal:     General: There is no distension.  Skin:    General: Skin is warm and dry.  Neurological:     Mental Status: She is alert.     Cranial Nerves: Dysarthria present.     Motor: Abnormal muscle tone present. No tremor.     Coordination: Coordination abnormal.     Comments: Strength in upper and lower extremities is roughly symmetric, age-appropriate 4+/5.  But discoordination, particular lower extremities noted NIH 3    ED Results / Procedures / Treatments   Labs (all labs ordered are listed, but only abnormal results are displayed) Labs Reviewed  I-STAT CHEM 8, ED - Abnormal; Notable for the following components:      Result Value   BUN 24 (*)    Glucose, Bld 117 (*)    Calcium, Ion 1.10 (*)    All other components within normal limits  RESP PANEL BY RT-PCR (FLU A&B, COVID) ARPGX2  PROTIME-INR  APTT  CBC  DIFFERENTIAL  ETHANOL  COMPREHENSIVE METABOLIC PANEL  RAPID URINE DRUG SCREEN, HOSP PERFORMED  URINALYSIS, ROUTINE W REFLEX MICROSCOPIC    EKG None  Radiology CT HEAD CODE STROKE WO CONTRAST  Result Date: 02/22/2021 CLINICAL DATA:  Code stroke. Acute neuro deficit. Aphasia, memory loss EXAM: CT HEAD WITHOUT CONTRAST TECHNIQUE: Contiguous axial images were obtained from the base of the skull through the vertex without intravenous contrast. COMPARISON:  CT head 06/11/2020 FINDINGS: Brain: Mild cerebral  atrophy. Mild white matter changes bilaterally which appear chronic. No acute infarct or hemorrhage 7 mm partially calcified dural base mass in the right parietal lobe unchanged from the prior study. Probable small meningioma. Vascular: Negative for hyperdense vessel Skull: Negative Sinuses/Orbits: Paranasal sinuses clear. Bilateral cataract extraction Other: None ASPECTS ([place] Stroke Program Early CT Score) - Ganglionic level infarction (caudate, lentiform nuclei, internal capsule, insula, M1-M3 cortex): 7 - Supraganglionic infarction (M4-M6 cortex): 3 Total score (0-10 with 10 being normal): 10 IMPRESSION: 1. No acute abnormality 2. ASPECTS is 10 3. Frontal atrophy with mild chronic microvascular ischemic change 4. 7 mm calcified meningioma right parietal lobe. Electronically Signed   By: Franchot Gallo M.D.   On: 02/22/2021 16:24    Procedures Procedures   Medications Ordered in ED Medications - No data to display  ED Course  I have reviewed the triage vital signs and the nursing notes.  Pertinent labs & imaging results that were available during my care of the patient were reviewed by me and considered in my medical decision making (see chart for details).   4:44 PM Patient starting tPa.  Case discussed at bedside with out neuro team.  Daughter at bedside.  5:23 PM Patient preparing to transfer to room upstairs.  She has received tPA, has not had a no appreciable change in her condition thus far.  Daughter is aware of results thus far. MDM Rules/Calculators/A&P Adult female, mild memory issues, but essentially functions independently, does not require assistance with ambulation presents as a code stroke due to acute onset ataxia, dysarthria.  Patient's vital signs reassuring, but her substantial change from baseline is noted, and given timeframe since onset of symptoms she was a candidate for tPA which was provided in the emergency department.  Patient coordinated closely with our neurology  colleagues. Patient required admission to the neuro ICU for ongoing monitoring, management.  MDM Number of Diagnoses or Management Options Acute ischemic stroke (  [place]): new, needed workup   Amount and/or Complexity of Data Reviewed Clinical lab tests: ordered and reviewed Tests in the radiology section of CPT: reviewed Tests in the medicine section of CPT: reviewed and ordered Decide to obtain previous medical records or to obtain history from someone other than the patient: yes Obtain history from someone other than the patient: yes Review and summarize past medical records: yes Discuss the patient with other providers: yes Independent visualization of images, tracings, or specimens: yes  Risk of Complications, Morbidity, and/or Mortality Presenting problems: high Diagnostic procedures: high Management options: high  Critical Care Total time providing critical care: 30-74 minutes (35)  Patient Progress Patient progress: stable    Final Clinical Impression(s) / ED Diagnoses Final diagnoses:  Acute ischemic stroke [facility])     Carmin Muskrat, MD 02/22/21 1731

## 2021-02-22 NOTE — ED Provider Notes (Signed)
Emergency Medicine Provider Triage Evaluation Note  Sharon Mcclure , a 80 y.o. female  was evaluated in triage.  Pt complains of altered mental status that started 1 hour prior to arrival.  Patient reporting aphasia, confusion.  She reports vision changes to the left eye.  States she fell yesterday and hit her head.   Review of Systems  Positive: Vision changes, aphasia, confusion Negative: fever  Physical Exam  There were no vitals taken for this visit. Gen:   Awake, no distress   Resp:  Normal effort  MSK:   Moves extremities without difficulty  Other:  Aphasia, no facial droop, reports blurred vision to the left eye, strength/sensation intact  Medical Decision Making  Medically screening exam initiated at 4:06 PM.  Appropriate orders placed.  Sharon Mcclure was informed that the remainder of the evaluation will be completed by another provider, this initial triage assessment does not replace that evaluation, and the importance of remaining in the ED until their evaluation is complete.  Code stroke initiated   Sharon Mcclure 02/22/21 1607    Blanchie Dessert, MD 02/24/21 1224

## 2021-02-23 ENCOUNTER — Inpatient Hospital Stay (HOSPITAL_COMMUNITY): Payer: Medicare Other

## 2021-02-23 ENCOUNTER — Inpatient Hospital Stay (HOSPITAL_BASED_OUTPATIENT_CLINIC_OR_DEPARTMENT_OTHER): Payer: Medicare Other

## 2021-02-23 DIAGNOSIS — I639 Cerebral infarction, unspecified: Secondary | ICD-10-CM | POA: Diagnosis present

## 2021-02-23 DIAGNOSIS — I6389 Other cerebral infarction: Secondary | ICD-10-CM

## 2021-02-23 DIAGNOSIS — E7800 Pure hypercholesterolemia, unspecified: Secondary | ICD-10-CM | POA: Diagnosis not present

## 2021-02-23 DIAGNOSIS — R2990 Unspecified symptoms and signs involving the nervous system: Secondary | ICD-10-CM | POA: Diagnosis present

## 2021-02-23 DIAGNOSIS — G319 Degenerative disease of nervous system, unspecified: Secondary | ICD-10-CM | POA: Diagnosis not present

## 2021-02-23 HISTORY — DX: Cerebral infarction, unspecified: I63.9

## 2021-02-23 HISTORY — DX: Unspecified symptoms and signs involving the nervous system: R29.90

## 2021-02-23 LAB — COMPREHENSIVE METABOLIC PANEL
ALT: 17 U/L (ref 0–44)
AST: 20 U/L (ref 15–41)
Albumin: 3.2 g/dL — ABNORMAL LOW (ref 3.5–5.0)
Alkaline Phosphatase: 37 U/L — ABNORMAL LOW (ref 38–126)
Anion gap: 7 (ref 5–15)
BUN: 19 mg/dL (ref 8–23)
CO2: 28 mmol/L (ref 22–32)
Calcium: 9.1 mg/dL (ref 8.9–10.3)
Chloride: 101 mmol/L (ref 98–111)
Creatinine, Ser: 0.8 mg/dL (ref 0.44–1.00)
GFR, Estimated: >60 mL/min (ref >60)
Glucose, Bld: 84 mg/dL (ref 70–99)
Potassium: 4 mmol/L (ref 3.5–5.1)
Sodium: 136 mmol/L (ref 135–145)
Total Bilirubin: 0.4 mg/dL (ref 0.3–1.2)
Total Protein: 5.6 g/dL — ABNORMAL LOW (ref 6.5–8.1)

## 2021-02-23 LAB — CBC
HCT: 34.7 % — ABNORMAL LOW (ref 36.0–46.0)
Hemoglobin: 11.2 g/dL — ABNORMAL LOW (ref 12.0–15.0)
MCH: 27.1 pg (ref 26.0–34.0)
MCHC: 32.3 g/dL (ref 30.0–36.0)
MCV: 83.8 fL (ref 80.0–100.0)
Platelets: 246 10*3/uL (ref 150–400)
RBC: 4.14 MIL/uL (ref 3.87–5.11)
RDW: 14.6 % (ref 11.5–15.5)
WBC: 10.5 10*3/uL (ref 4.0–10.5)
nRBC: 0 % (ref 0.0–0.2)

## 2021-02-23 LAB — ECHOCARDIOGRAM COMPLETE
Area-P 1/2: 2.03 cm2
Height: 61 in
MV M vel: 5.71 m/s
MV Peak grad: 130.4 mmHg
Radius: 0.3 cm
S' Lateral: 3.4 cm
Weight: 2811.31 oz

## 2021-02-23 LAB — LIPID PANEL
Cholesterol: 151 mg/dL (ref 0–200)
HDL: 46 mg/dL (ref >40)
LDL Cholesterol: 77 mg/dL (ref 0–99)
Total CHOL/HDL Ratio: 3.3 RATIO
Triglycerides: 139 mg/dL (ref <150)
VLDL: 28 mg/dL (ref 0–40)

## 2021-02-23 MED ORDER — CHLORHEXIDINE GLUCONATE CLOTH 2 % EX PADS: 6.0000 | MEDICATED_PAD | Freq: Every day | CUTANEOUS | Status: DC

## 2021-02-23 NOTE — Progress Notes (Signed)
PT Cancellation Note  Patient Details Name: Sharon Mcclure MRN: [id number] DOB: 23-Sep-1940   Cancelled Treatment:    Reason Eval/Treat Not Completed: Active bedrest order  Wyona Almas, PT, DPT Acute Rehabilitation Services Pager [phone] Office [phone]    Deno Etienne 02/23/2021, 7:44 AM

## 2021-02-23 NOTE — Evaluation (Signed)
Occupational Therapy Evaluation Patient Details Name: Sharon Mcclure MRN: [id number] DOB: [birth year] Today's Date: 02/23/2021    History of Present Illness 80 y.o. female with PMHx of HTN, HLD, fatty liver disease, anxiety, and CAD who presented to the ED with acute onset of word-finding difficulty, ataxia, and unsteady gait. Pt administered tPA at 16:32 on 6/28 and symptoms have resolved. CT head negative. MRI brain pending.   Clinical Impression   Pt PTA: Pt independent. Pt currently, with no focal deficits. Pt performing ADL and mobility with no AD and no physical assist. Pt's cognition is intact after performing Short Blessed Test for memory and attention, etc. Pt has family support at home. Pt currently modified independence with ADL and mobility.  Pt does not require continued OT skilled services. OT signing off.      Follow Up Recommendations  No OT follow up;Supervision - Intermittent    Equipment Recommendations  None recommended by OT    Recommendations for Other Services       Precautions / Restrictions Precautions Precautions: None Restrictions Weight Bearing Restrictions: No      Mobility Bed Mobility Overal bed mobility: Modified Independent                  Transfers Overall transfer level: Modified independent               General transfer comment: no AD, pt practicing up/down 6 steps    Balance Overall balance assessment: Modified Independent                                         ADL either performed or assessed with clinical judgement   ADL Overall ADL's : Modified independent                                       General ADL Comments: No focal deficits.     Vision Baseline Vision/History: Wears glasses Wears Glasses: Reading only Patient Visual Report: No change from baseline Vision Assessment?: Yes Eye Alignment: Within Functional Limits Ocular Range of Motion: Within Functional  Limits Alignment/Gaze Preference: Within Defined Limits Saccades: Within functional limits     Perception     Praxis      Pertinent Vitals/Pain Pain Assessment: No/denies pain     Hand Dominance Right   Extremity/Trunk Assessment Upper Extremity Assessment Upper Extremity Assessment: Overall WFL for tasks assessed   Lower Extremity Assessment Lower Extremity Assessment: Overall WFL for tasks assessed   Cervical / Trunk Assessment Cervical / Trunk Assessment: Normal   Communication Communication Communication: No difficulties   Cognition Arousal/Alertness: Awake/alert Behavior During Therapy: WFL for tasks assessed/performed Overall Cognitive Status: Within Functional Limits for tasks assessed                                 General Comments: Pt passing Short Blessed test scoring 1/28 resulting in normal cognition. Pt A/Ox4. Per pt's daughter she took the wrong medication of her son's that made her have stroke like symptoms. Pt knew that it was not her medication, but still took it. OTR advised for pt to keep medication separate from her son to decrease future mistakes.   General Comments  HR  <120 BPM with exertion; BP 140/66  at EOB when reportingslight lightheadedness.    Exercises     Shoulder Instructions      Home Living Family/patient expects to be discharged to:: Private residence Living Arrangements: Children Available Help at Discharge: Family;Available PRN/intermittently Type of Home: House Home Access: Stairs to enter [employer] of Steps: 6 Entrance Stairs-Rails: Can reach both Home Layout: One level     Bathroom Shower/Tub: Occupational psychologist: Standard     Home Equipment: Shower seat;Grab bars - toilet;Grab bars - tub/shower;Cane - single point          Prior Functioning/Environment Level of Independence: Independent        Comments: Pt performing own ADL and mobility; manges medications and light  housework and son performs iADL as needed (cooks, cleans).        OT Problem List:        OT Treatment/Interventions:      OT Goals(Current goals can be found in the care plan section) Acute Rehab OT Goals Patient Stated Goal: to go home OT Goal Formulation: All assessment and education complete, DC therapy Potential to Achieve Goals: Good  OT Frequency:     Barriers to D/C:            Co-evaluation              AM-PAC OT "6 Clicks" Daily Activity     Outcome Measure Help from another person eating meals?: None Help from another person taking care of personal grooming?: None Help from another person toileting, which includes using toliet, bedpan, or urinal?: None Help from another person bathing (including washing, rinsing, drying)?: None Help from another person to put on and taking off regular upper body clothing?: None Help from another person to put on and taking off regular lower body clothing?: None 6 Click Score: 24   End of Session Equipment Utilized During Treatment: Gait belt Nurse Communication: Mobility status  Activity Tolerance: Patient tolerated treatment well Patient left: in bed;with call bell/phone within reach;with bed alarm set  OT Visit Diagnosis: Unsteadiness on feet (R26.81)                Time: 1497-0263 OT Time Calculation (min): 30 min Charges:  OT General Charges $OT Visit: 1 Visit OT Evaluation $OT Eval Moderate Complexity: 1 Mod OT Treatments $Self Care/Home Management : 8-22 mins  Jefferey Pica, OTR/L Acute Rehabilitation Services Pager: [phone] Office: [phone]   Sharon Mcclure  C 02/23/2021, 2:28 PM

## 2021-02-23 NOTE — Care Management Obs Status (Signed)
[place] NOTIFICATION   Patient Details  Name: Sharon Mcclure MRN: [id number] Date of Birth: 1940-10-19   Medicare Observation Status Notification Given:  Yes    Geralynn Ochs, [place] 02/23/2021, 4:15 PM

## 2021-02-23 NOTE — Plan of Care (Signed)
MRI negative.  Appropriate for discharge today.

## 2021-02-23 NOTE — Discharge Instructions (Addendum)
Sharon Mcclure,   You presented to the hospital with symptoms that were concerning for a stroke. Fortunately, you did not have a stroke. We believe your initial symptoms were due to accidentally taking gummies that contained THC.  Continue to take your home medications listed in your after visit summary. You can start to take Aspirin again tomorrow on 02/24/21.   It was a pleasure caring for you. Your medical team wishes you the best.   Sincerely,  [facility]

## 2021-02-23 NOTE — Therapy (Signed)
Pt is modified independent with ADL and mobility with no AD at this time. Pt with slight dizziness, but no hypotension with positional change. Pt performed stairs and performing own ADL in room. Pt appears to have intact cognition, vision and sensation with no focal deficits. Pt does not require continued OT skilled services. OT eval note to follow.  Jefferey Pica, OTR/L Acute Rehabilitation Services Pager: [phone] Office: [phone]

## 2021-02-23 NOTE — Progress Notes (Signed)
  Echocardiogram 2D Echocardiogram has been performed.  Sharon Mcclure 02/23/2021, 11:00 AM

## 2021-02-23 NOTE — Discharge Summary (Addendum)
Stroke Discharge Summary  Patient ID: Sharon Mcclure       MRN: [id number]      DOB: 06-27-1941  Date of Admission: 02/22/2021 Date of Discharge: 02/23/2021  Attending Physician: Dr. Erlinda Hong  Consultant(s): None  Patient's PCP:  McLean-Scocuzza, Nino Glow, MD  DISCHARGE DIAGNOSIS:  Principal Problem:   Stroke-like episode   Allergies as of 02/23/2021       Reactions   Ampicillin Hives, Shortness Of Breath, Swelling   Has patient had a PCN reaction causing immediate rash, facial/tongue/throat swelling, SOB or lightheadedness with hypotension: Yes Has patient had a PCN reaction causing severe rash involving mucus membranes or skin necrosis: No Has patient had a PCN reaction that required hospitalization No Has patient had a PCN reaction occurring within the last 10 years: No If all of the above answers are "NO", then may proceed with Cephalosporin use.   Penicillins Hives, Shortness Of Breath   Has patient had a PCN reaction causing immediate rash, facial/tongue/throat swelling, SOB or lightheadedness with hypotension: Yes Has patient had a PCN reaction causing severe rash involving mucus membranes or skin necrosis: No Has patient had a PCN reaction that required hospitalization No Has patient had a PCN reaction occurring within the last 10 years: No If all of the above answers are "NO", then may proceed with Cephalosporin use.   Crestor [rosuvastatin Calcium]    Burning sensation   Ezetimibe-simvastatin    Niaspan [niacin Er]    Fatigues, aches.   Vytorin [ezetimibe-simvastatin]    Zetia [ezetimibe]    Zocor [simvastatin]    Fenofibrate    Constipation and confusion   Welchol [colesevelam Hcl] Other (See Comments)   Severe bloating even on low dose        Medication List     TAKE these medications    amLODipine 5 MG tablet Commonly known as: NORVASC Take 1 tablet (5 mg total) by mouth daily.   aspirin 81 MG tablet Take 81 mg by mouth daily.   Biotin 10000 MCG  Tabs Take 1 tablet by mouth daily.   buPROPion 150 MG 24 hr tablet Commonly known as: Wellbutrin XL Take 1 tablet (150 mg total) by mouth daily.   CALCIUM+D3 PO Take 1 tablet by mouth daily.   citalopram 20 MG tablet Commonly known as: CELEXA Take 1 tablet (20 mg total) by mouth daily.   cyanocobalamin 1000 MCG tablet Take 1,000 mcg by mouth daily.   pantoprazole 40 MG tablet Commonly known as: PROTONIX Take 1 tablet (40 mg total) by mouth 2 (two) times daily before a meal. 30 minute before food   traZODone 50 MG tablet Commonly known as: DESYREL Take 1 tablet (50 mg total) by mouth at bedtime as needed for sleep.   VITAMIN D (ERGOCALCIFEROL) PO Take 5,000 Units by mouth daily.       ASK your doctor about these medications    furosemide 20 MG tablet Commonly known as: Lasix Take 1 tablet (20 mg total) by mouth daily as needed.   metroNIDAZOLE 0.75 % cream Commonly known as: MetroCream Apply topically 2 (two) times daily.   Repatha SureClick 818 MG/ML Soaj Generic drug: Evolocumab INJECT 1 PEN INTO THE SKIN EVERY 14 DAYS       Significant Imaging and Labs   02/22/21 CT Head  1. No acute abnormality 2. ASPECTS is 10 3. Frontal atrophy with mild chronic microvascular ischemic change 4. 7 mm calcified meningioma right parietal lobe.  02/22/21 CTA Head and Neck  CTA neck:   The common carotid, internal carotid and vertebral arteries are patent within the neck without hemodynamically significant stenosis (50% or greater). Atherosclerotic disease, as described.   CTA head:   1. No intracranial large vessel occlusion or proximal high-grade stenosis 2. Calcified plaque within both intracranial internal carotid arteries with no more than mild stenosis. 3. Mild atherosclerotic irregularity of the posterior cerebral arteries bilaterally. 4. 1-2 mm vascular protrusion arising from the paraclinoid left ICA, which may reflect an aneurysm or infundibulum. 5. 2  mm ventrally projecting vascular protrusion arising from the P1 left posterior cerebral artery, which may reflect an aneurysm or a portion of an otherwise poorly visualized left posterior communicating artery.  02/23/21 MRI Brain WO IV Contrast No evidence of acute intracranial abnormality.   Redemonstrated 7 mm partially calcified meningioma overlying the right parietal lobe.   Mild chronic small vessel ischemic changes within the cerebral white matter and basal ganglia.   Mild generalized parenchymal atrophy.  02/23/21 Echocardiogram   1. Left ventricular ejection fraction, by estimation, is 55 to 60%. The left ventricle has normal function. The left ventricle has no regional wall motion abnormalities. There is mild left ventricular hypertrophy.  Left ventricular diastolic parameters are consistent with Grade I diastolic dysfunction (impaired relaxation).   2. Right ventricular systolic function is normal. The right ventricular size is normal. There is normal pulmonary artery systolic pressure. The estimated right ventricular systolic pressure is 77.8 mmHg.   3. Left atrial size was mild to moderately dilated.   4. The aortic valve is abnormal. There is mild calcification of the aortic valve. Aortic valve regurgitation is not visualized. No aortic stenosis is present.   5. Bileaflet mitral valve prolapse. The mitral valve is myxomatous. Moderate mitral valve regurgitation. No evidence of mitral stenosis.   6. The inferior vena cava is normal in size with greater than 50% respiratory variability, suggesting right atrial pressure of 3 mmHg.   7. Cannot exclude small PFO.   History of Present Illness  Sharon Mcclure is a 80 y.o. female with a medical history significant for coronary artery disease on ASA 81 mg daily, hypertension, hyperlipidemia, anxiety, and vitamin D deficiency who presented to the ED for evaluation of acute onset of aphasia and an incoordinated gait. Details about  presenting complaints are slightly limited by patient's loss of fluency in conversation but daughter at bedside states that Sharon Mcclure called her stating that she needed help and that she was unable to verbalize the help that she needed. Her daughter states that it took some time to communicate with her mother due to word finding difficulties. Her son states that he had been with Sharon Mcclure from noon until 1PM and that she was at her baseline functioning and had made him lunch today before he left the house. Sharon Mcclure recalls sitting at the table, feeling nauseated, and "off" at the time that she called her daughter to ask for help.   At baseline, she lives with her son who helps her keep up the house and chores but is able to ambulate independently without assistive device. She does not have any known memory problems and does not have a known history of stroke. Of note, Ms. Campus did sustain a fall in the yard on Monday, February 14, 2021 with scattered abrasions on her left cheek and left forearm.   LKW: 13:00 tpa given?: yes, initially offered at 16:33, administration delayed due to difficulties determining LKW time via telephone with  connection issues, establishing IV access. Administered at 16:41 IR Thrombectomy? No, presentation not consistent with LVO Modified Rankin Scale: 1-No significant post stroke disability and can perform usual duties with stroke symptoms  Physical Examination   Constitutional: Pleasant elderly Caucasian female sitting up in bed  Cards: RRR  Resp: Unlabored respirations   Neurological examination: MS: AAOx4, following commands Speech: Fluent with repetition and naming intact CN: EOMI, VFF, very subtly decreased right nasolabial fold, Tongue midline Motor: Normal bulk and tone. No drift. Strength 5/5 througout Coordination: FNF intact  Sensation: Intact to light touch throughout  Gait: Deferred   NIHSS 0   Assessment and Plan:  Sharon Mcclure is a 80 year  old female w/pmh of HTN, HLD, fatty liver disease, anxiety, and CAD who presented to the ED with acute onset of word-finding difficulty, ataxia, and unsteady gait. She received IVTPA and did not undergo thrombectomy due to lack of LVO.   #Difficulty Speaking with unsteady Gait s/p tPA, likely due to medication effect, no stroke on MRI She presents with the symptoms described above. Upon speaking further with the patient and her daughter this morning daughter states that her mother takes CBD gummies for her arthritic pain. Around 12 yesterday instead of taking her own CBD gummies she accidentally took her sons gummies which contain THC. 6/28 UDS was positive for THC confirming gummy ingestion. Her symptoms started 74min after the [facility] administration and resolved two hours after the stroke code and today she has no neurological deficits with an NIHSS of 0. Given this information was told to the medical team the following day, after being treated with IVTPA, a full stroke work up was done.   Initial CTH w/NAICP. CTA Head and Neck showed no significant stenosis. Stroke labs w/LDL 77 hemoglobin A1C pending at discharge. MRI Brain was negative for acute stroke. Echo showed EF 55 to 60 %, mild LVH and LA mild to moderately dilated. She is to continue her Aspirin for CAD at discharge.   It is felt that her initial presenting symptoms are in the setting of THC ingestion. No need for stroke follow up. She was instructed to follow up with her PCP for general care.   #HTN  Continue Amlodipine 5 mg QD at discharge.   #Stroke Dysphagia Screening Passed bedside swallow for a heart healthy diet.   #Stroke Diabetes Screening  A1C pending at discharge.   Sharon Hinds, NP  Stroke Service Nurse Practitioner   Patient seen and discussed with attending physician Dr. Erlinda Hong   35 minutes were spent preparing discharge.  ATTENDING NOTE: I reviewed above note and agree with the assessment and plan. Pt was seen and  examined.   80 year old female with history of CAD, hypertension, hyperlipidemia on Repatha admitted for aphasia, unsteady gait, word finding difficulty status post tPA.  CT no acute abnormality.  CTA head and neck diffuse mild atherosclerosis.  MRI no acute infarct.  EF 55 to 60%.  LDL 77, A1c pending, UDS positive for THC.  Creatinine 0.90.  During the HPI collection, patient and daughter confirmed that she supposed to take her CBD gummy but instead she took her son's THC gummy.  She had a symptoms after 45 minutes of the administration and resolved 2 hours after tPA administration.  So far MRI negative.  Patient symptoms temporarily associated with THC use, which most likely the cause of patient's symptoms.  PT OT no recs, patient would like to be discharged today.  Therefore, patient discharged with continue home  medication including aspirin 81 and Repatha.  No stroke clinic follow-up needed at this time.  For detailed assessment and plan, please refer to above as I have made changes wherever appropriate.   Sharon Hawking, MD PhD Stroke Neurology 02/23/2021 6:51 PM

## 2021-02-23 NOTE — Care Management CC44 (Signed)
Condition Code 44 Documentation Completed  Patient Details  Name: Sharon Mcclure MRN: [id number] Date of Birth: 02-03-1941   Condition Code 44 given:  Yes Patient signature on Condition Code 44 notice:  Yes Documentation of 2 MD's agreement:  Yes Code 44 added to claim:  Yes    Geralynn Ochs, [place] 02/23/2021, 4:15 PM

## 2021-02-23 NOTE — Research (Signed)
Discussed [employer] study enrollment with Sharon Mcclure and her daughter. Explained the study involves data collection and questionnaires at time of discharge and 90 days via phone call. Both are agreeable to participate. Information sheet provided. No questions at this time.

## 2021-02-23 NOTE — Progress Notes (Signed)
PT Cancellation Note  Patient Details Name: Sharon Mcclure MRN: [id number] DOB: 06-22-1941   Cancelled Treatment:    Reason Eval/Treat Not Completed: PT screened, no needs identified, will sign off Pt denies residual deficits; walking unit and performed stairs with OT independently. No acute or follow up PT needs. Thank you for this consult.  Wyona Almas, PT, DPT Acute Rehabilitation Services Pager [phone] Office [phone]    Deno Etienne 02/23/2021, 1:02 PM

## 2021-02-24 ENCOUNTER — Telehealth: Payer: Self-pay

## 2021-02-24 LAB — HEMOGLOBIN A1C
Hgb A1c MFr Bld: 5.7 % — ABNORMAL HIGH (ref 4.8–5.6)
Mean Plasma Glucose: 117 mg/dL

## 2021-02-24 NOTE — Telephone Encounter (Signed)
Transition Care Management Unsuccessful Follow-up Telephone Call  Date of discharge and from where:  02/23/21 from [facility]  Attempts:  1st Attempt  Reason for unsuccessful TCM follow-up call:  Unable to reach patient

## 2021-02-25 NOTE — Telephone Encounter (Signed)
Transition Care Management Unsuccessful Follow-up Telephone Call  Date of discharge and from where:  02/23/21 from [facility]  Attempts:  2nd Attempt  Reason for unsuccessful TCM follow-up call:  No answer/busy. Appt 03/16/21. Will follow to schedule sooner.

## 2021-03-01 NOTE — Telephone Encounter (Signed)
Transition Care Management Follow-up Telephone Call Date of discharge and from where: 02/23/21 from [facility] How have you been since you were released from the hospital? Patient notes," I am doing fine." Denies pain, dizziness, numbness, blurred vision, slurred speech and all other stroke like symptoms. Notes she is pretty much at her baseline.  Any questions or concerns? No  Items Reviewed: Did the pt receive and understand the discharge instructions provided? Yes  Medications obtained and verified? Yes  Other? Yes , continuing to only take fluid pill as needed.  Any new allergies since your discharge? No  Dietary orders reviewed? Yes Do you have support at home? Yes   Home Care and Equipment/Supplies: Were home health services ordered? no  Functional Questionnaire: (I = Independent and D = Dependent) ADLs: I  Follow up appointments reviewed:  PCP Hospital f/u appt confirmed? Yes  Scheduled to see PCP on 03/16/21. Declines earlier appointment. Agrees to call with changes.  Appt with GI scheduled 03/02/21; dysphagia.  Are there transportation arrangements needed? No  If their condition worsens, is the pt aware to call PCP or go to the Emergency Dept.? Yes Was the patient provided with contact information for the PCP's office or ED? Yes Was to pt encouraged to call back with questions or concerns? Yes

## 2021-03-02 ENCOUNTER — Ambulatory Visit: Payer: Medicare Other | Admitting: Gastroenterology

## 2021-03-02 ENCOUNTER — Encounter: Payer: Self-pay | Admitting: *Deleted

## 2021-03-16 ENCOUNTER — Other Ambulatory Visit: Payer: Self-pay

## 2021-03-16 ENCOUNTER — Encounter: Payer: Self-pay | Admitting: Internal Medicine

## 2021-03-16 ENCOUNTER — Ambulatory Visit (INDEPENDENT_AMBULATORY_CARE_PROVIDER_SITE_OTHER): Payer: Medicare Other | Admitting: Internal Medicine

## 2021-03-16 VITALS — BP 126/80 | HR 67 | Temp 98.0°F | Ht 61.0 in | Wt 169.0 lb

## 2021-03-16 DIAGNOSIS — R5383 Other fatigue: Secondary | ICD-10-CM | POA: Diagnosis not present

## 2021-03-16 DIAGNOSIS — D329 Benign neoplasm of meninges, unspecified: Secondary | ICD-10-CM | POA: Diagnosis not present

## 2021-03-16 DIAGNOSIS — D509 Iron deficiency anemia, unspecified: Secondary | ICD-10-CM

## 2021-03-16 DIAGNOSIS — R7303 Prediabetes: Secondary | ICD-10-CM

## 2021-03-16 DIAGNOSIS — T148XXA Other injury of unspecified body region, initial encounter: Secondary | ICD-10-CM | POA: Diagnosis not present

## 2021-03-16 DIAGNOSIS — R269 Unspecified abnormalities of gait and mobility: Secondary | ICD-10-CM

## 2021-03-16 MED ORDER — MUPIROCIN 2 % EX OINT: 1.0000 "application " | TOPICAL_OINTMENT | Freq: Two times a day (BID) | CUTANEOUS | 0 refills | Status: DC

## 2021-03-16 NOTE — Patient Instructions (Addendum)
Premier protein shake daily    Call back when ready for neurology consult middle 05/2021  Consider GI referral as well      Dermatology Dr. Wyline Mood    [place] [phone]   [facility]   [place], [place] [address]   Phone: [phone]   Fax: [place]   [address]   [place], [place] [address]   Phone: [phone]   Fax: [phone]        Meningioma  A meningioma is a growth (tumor) that occurs in the meninges, which is the thin tissue that covers the brain and spinal cord. Meningiomas are usually benign. Benign means they are not cancerous and do not spread to other areas. In rare cases, a meningioma may become cancerous (malignant). What are the causes? This condition may be caused by: Being exposed to ionizing radiation. This type of energy occurs naturally, and it has artificial sources, such as X-rays and some medical devices. Neurofibromatosis 2. This is a genetic disorder that causes multiple soft tumors. Genetic mutation. This is a change in certain genes. In many cases, the cause of this condition is not known. What increases the risk? The following factors may make you more likely to develop this condition: Having been exposed to radiation, especially as a child. Being a woman. There may be a greater risk associated with having female hormones. Older women have a higher risk of meningiomas than men or children. However, men have a higher risk of malignant meningiomas. Obesity. What are the signs or symptoms? Common symptoms of this condition include: Headaches. Nausea and vomiting. Vision changes. Changes to hearing. Loss of your sense of smell. Other symptoms include: Seizures. Weakness or numbness on one side of your body, or in an arm or a leg. Problems with memory or thinking. Mood or personality changes. Symptoms of this condition usually begin very slowly. The symptoms may  dependon the size and location of your tumor. How is this diagnosed? This condition is diagnosed based on: Results of brain imaging tests, such as a CT scan or an MRI. Removal of a tissue sample of the tumor to look at under a microscope (biopsy). This may be done to confirm the diagnosis and to help determine the best treatment for your condition. How is this treated? This condition may be treated with: Steroids. These are medicines for lowering brain swelling and improving symptoms. Radiation therapy. This therapy uses high-energy rays to shrink or kill your tumor. Surgery to remove as much of your tumor as possible. You may not have treatment until your symptoms start to affect your daily activities. This is because meningiomas grow very slowly. Your health careprovider may prefer to watch for growth of your tumor before starting treatment. Follow these instructions at home: Take over-the-counter and prescription medicines only as told by your health care provider. Follow instructions from your health care provider about eating or drinking restrictions. Drink enough fluid to keep your urine pale yellow. Return to your normal activities as told by your health care provider. Ask your health care provider what activities are safe for you. Keep all follow-up visits as told by your health care provider. This is important. You may need regular visits to watch the growth of your tumor. Contact a health care provider if: You have symptoms that come back. You have diarrhea. You vomit. You have pain in your abdomen (abdominal pain). You cannot eat  or drink as much as you need. You are weaker or more tired than usual. You are losing weight without trying. Get help right away if: Your diarrhea, vomiting, or abdominal pain does not go away, or you cannot eat or drink without vomiting. You have sudden changes in vision. You have trouble walking. You have a seizure. You have bleeding that does not  stop. You have trouble breathing. You have a fever. You have new weakness or numbness on one side of your body. Summary A meningioma is a growth (tumor) that occurs in the meninges, which is the thin tissue that covers the brain and spinal cord. Meningiomas are usually benign. Benign means they are not cancerous and do not spread to other areas. Symptoms of this condition usually begin very slowly. The symptoms may depend on the size and location of your tumor. You may not need treatment until your symptoms start to affect your daily activities. Your tumor may be watched over time. This information is not intended to replace advice given to you by your health care provider. Make sure you discuss any questions you have with your healthcare provider. Document Revised: 08/18/2019 Document Reviewed: 08/18/2019 Elsevier Patient Education  2022 [employer].

## 2021-03-16 NOTE — Progress Notes (Signed)
Chief Complaint  Patient presents with   Follow-up   HFU at [facility] for fall and abrasions and stroke w/u negative but + meningioma 7 mm right parietal 6/28-6/29/22  Wants to hold neurology for now   Review of Systems  Constitutional:  Negative for weight loss.  HENT:  Negative for hearing loss.   Eyes:  Negative for blurred vision.  Respiratory:  Negative for shortness of breath.   Cardiovascular:  Negative for chest pain.  Musculoskeletal:  Negative for falls and joint pain.  Skin:  Negative for rash.  Neurological:  Negative for dizziness and headaches.  Psychiatric/Behavioral:  Negative for depression and memory loss.   Past Medical History:  Diagnosis Date   Anxiety    CAD (coronary artery disease)    Cancer ([place])    basal cell-right nostril   Fatty liver    GERD (gastroesophageal reflux disease)    Heart murmur    Hemorrhoids    Hiatal hernia    History of skin cancer    Right arm   HTN (hypertension)    Hyperlipidemia    Insomnia    Insomnia    Pneumonia October 2016   Rosacea 11/24/2013   Vitamin D deficiency    Past Surgical History:  Procedure Laterality Date   APPENDECTOMY     BASAL CELL CARCINOMA EXCISION     right nostril   CATARACT EXTRACTION W/PHACO Left 01/25/2016   Procedure: CATARACT EXTRACTION PHACO AND INTRAOCULAR LENS PLACEMENT ([place]);  Surgeon: Birder Robson, MD;  Location: [facility] ORS;  Service: Ophthalmology;  Laterality: Left;  [place] 33.9 AP% 15.5 CDE 5.28 Fluid Pack Lot # [id number]   CATARACT EXTRACTION W/PHACO Right 02/22/2016   Procedure: CATARACT EXTRACTION PHACO AND INTRAOCULAR LENS PLACEMENT ([facility]);  Surgeon: Birder Robson, MD;  Location: [facility] ORS;  Service: Ophthalmology;  Laterality: Right;  [place] 00:40 AP% 23.6 CDE 9.45 fluid pack lot # [id number]   MOHS SURGERY     bcc right nasal ala 01/2020   TUBAL LIGATION  1981   Family History  Problem Relation Age of Onset   Heart disease Mother 6       aortic valve- had AVR- died 59 wks after    Hyperlipidemia Mother    Aneurysm Father 82       aortic aneursym   Heart disease Father    Heart disease Brother 56       CABG   Colon cancer Neg Hx    Stomach cancer Neg Hx    Social History   Socioeconomic History   Marital status: Single    Spouse name: Not on file   Number of children: Not on file   Years of education: Not on file   Highest education level: Not on file  Occupational History   Not on file  Tobacco Use   Smoking status: Former    Packs/day: 0.25    Years: 22.00    Pack years: 5.50    Types: Cigarettes    Quit date: 1980    Years since quitting: 42.5   Smokeless tobacco: Never  Vaping Use   Vaping Use: Never used  Substance and Sexual Activity   Alcohol use: No   Drug use: No   Sexual activity: Not on file  Other Topics Concern   Not on file  Social History Narrative   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    1 daughter    [place]    Social Determinants of Health  Financial Resource Strain: Low Risk    Difficulty of Paying Living Expenses: Not hard at all  Food Insecurity: No Food Insecurity   Worried About Charity fundraiser in the Last Year: Never true   Ran Out of Food in the Last Year: Never true  Transportation Needs: No Transportation Needs   Lack of Transportation (Medical): No   Lack of Transportation (Non-Medical): No  Physical Activity: Not on file  Stress: No Stress Concern Present   Feeling of Stress : Not at all  Social Connections: Unknown   Frequency of Communication with Friends and Family: More than three times a week   Frequency of Social Gatherings with Friends and Family: Not on file   Attends Religious Services: Not on Electrical engineer or Organizations: Not on file   Attends Archivist Meetings: Not on file   Marital Status: Not on file  Intimate Partner Violence: Not At Risk   Fear of Current or Ex-Partner: No   Emotionally Abused: No   Physically Abused: No   Sexually Abused: No    Current Meds  Medication Sig   amLODipine (NORVASC) 5 MG tablet Take 1 tablet (5 mg total) by mouth daily.   aspirin 81 MG tablet Take 81 mg by mouth daily.   Biotin 10000 MCG TABS Take 1 tablet by mouth daily.   buPROPion (WELLBUTRIN XL) 150 MG 24 hr tablet Take 1 tablet (150 mg total) by mouth daily.   Calcium Carb-Cholecalciferol (CALCIUM+D3 PO) Take 1 tablet by mouth daily.   citalopram (CELEXA) 20 MG tablet Take 1 tablet (20 mg total) by mouth daily.   cyanocobalamin 1000 MCG tablet Take 1,000 mcg by mouth daily.   mupirocin ointment (BACTROBAN) 2 % Apply 1 application topically 2 (two) times daily. Left arm prn   pantoprazole (PROTONIX) 40 MG tablet Take 1 tablet (40 mg total) by mouth 2 (two) times daily before a meal. 30 minute before food   REPATHA SURECLICK 314 MG/ML SOAJ INJECT 1 PEN INTO THE SKIN EVERY 14 DAYS (Patient taking differently: Inject 140 mg/mL into the skin every 14 (fourteen) days.)   traZODone (DESYREL) 50 MG tablet Take 1 tablet (50 mg total) by mouth at bedtime as needed for sleep.   Allergies  Allergen Reactions   Ampicillin Hives, Shortness Of Breath and Swelling    Has patient had a PCN reaction causing immediate rash, facial/tongue/throat swelling, SOB or lightheadedness with hypotension: Yes Has patient had a PCN reaction causing severe rash involving mucus membranes or skin necrosis: No Has patient had a PCN reaction that required hospitalization No Has patient had a PCN reaction occurring within the last 10 years: No If all of the above answers are "NO", then may proceed with Cephalosporin use.    Penicillins Hives and Shortness Of Breath    Has patient had a PCN reaction causing immediate rash, facial/tongue/throat swelling, SOB or lightheadedness with hypotension: Yes Has patient had a PCN reaction causing severe rash involving mucus membranes or skin necrosis: No Has patient had a PCN reaction that required hospitalization No Has patient had a PCN  reaction occurring within the last 10 years: No If all of the above answers are "NO", then may proceed with Cephalosporin use.    Crestor [Rosuvastatin Calcium]     Burning sensation   Ezetimibe-Simvastatin    Niaspan [Niacin Er]     Fatigues, aches.   Vytorin [Ezetimibe-Simvastatin]    Zetia [Ezetimibe]    Zocor [  Simvastatin]    Fenofibrate     Constipation and confusion   Welchol [Colesevelam Hcl] Other (See Comments)    Severe bloating even on low dose   Recent Results (from the past 2160 hour(s))  Ethanol     Status: None   Collection Time: 02/22/21  4:06 PM  Result Value Ref Range   Alcohol, Ethyl (B) <10 <10 mg/dL    Comment: (NOTE) Lowest detectable limit for serum alcohol is 10 mg/dL.  For medical purposes only. Performed at [facility], [place] [address], [place], [place] [address]   Protime-INR     Status: None   Collection Time: 02/22/21  4:06 PM  Result Value Ref Range   Prothrombin Time 13.2 11.4 - 15.2 seconds   INR 1.0 0.8 - 1.2    Comment: (NOTE) INR goal varies based on device and disease states. Performed at [facility], [place] [address], [place], [place] [address]   APTT     Status: None   Collection Time: 02/22/21  4:06 PM  Result Value Ref Range   aPTT 24 24 - 36 seconds    Comment: Performed at [place] [address] [place], [place] [address]  CBC     Status: None   Collection Time: 02/22/21  4:06 PM  Result Value Ref Range   WBC 8.5 4.0 - 10.5 K/uL   RBC 4.69 3.87 - 5.11 MIL/uL   Hemoglobin 12.5 12.0 - 15.0 g/dL   HCT 40.2 36.0 - 46.0 %   MCV 85.7 80.0 - 100.0 fL   MCH 26.7 26.0 - 34.0 pg   MCHC 31.1 30.0 - 36.0 g/dL   RDW 14.7 11.5 - 15.5 %   Platelets 288 150 - 400 K/uL   nRBC 0.0 0.0 - 0.2 %    Comment: Performed at [facility], [place] [address], [place], [place] [address]  Differential     Status: None   Collection Time: 02/22/21  4:06 PM  Result Value Ref Range   Neutrophils Relative % 52 %   Neutro  Abs 4.4 1.7 - 7.7 K/uL   Lymphocytes Relative 34 %   Lymphs Abs 2.9 0.7 - 4.0 K/uL   Monocytes Relative 10 %   Monocytes Absolute 0.8 0.1 - 1.0 K/uL   Eosinophils Relative 3 %   Eosinophils Absolute 0.3 0.0 - 0.5 K/uL   Basophils Relative 1 %   Basophils Absolute 0.1 0.0 - 0.1 K/uL   Immature Granulocytes 0 %   Abs Immature Granulocytes 0.02 0.00 - 0.07 K/uL    Comment: Performed at [place] [address] [place], [place] [address]  Comprehensive metabolic panel     Status: Abnormal   Collection Time: 02/22/21  4:06 PM  Result Value Ref Range   Sodium 139 135 - 145 mmol/L   Potassium 4.3 3.5 - 5.1 mmol/L   Chloride 104 98 - 111 mmol/L   CO2 26 22 - 32 mmol/L   Glucose, Bld 117 (H) 70 - 99 mg/dL    Comment: Glucose reference range applies only to samples taken after fasting for at least 8 hours.   BUN 22 8 - 23 mg/dL   Creatinine, Ser 0.93 0.44 - 1.00 mg/dL   Calcium 9.3 8.9 - 10.3 mg/dL   Total Protein 6.5 6.5 - 8.1 g/dL   Albumin 3.7 3.5 - 5.0 g/dL   AST 23 15 - 41 U/L   ALT 20 0 -  44 U/L   Alkaline Phosphatase 47 38 - 126 U/L   Total Bilirubin 0.5 0.3 - 1.2 mg/dL   GFR, Estimated >60 >60 mL/min    Comment: (NOTE) Calculated using the CKD-EPI Creatinine Equation (2021)    Anion gap 9 5 - 15    Comment: Performed at [facility] [address], [place], [place] [address]  I-stat chem 8, ED     Status: Abnormal   Collection Time: 02/22/21  4:16 PM  Result Value Ref Range   Sodium 139 135 - 145 mmol/L   Potassium 4.2 3.5 - 5.1 mmol/L   Chloride 104 98 - 111 mmol/L   BUN 24 (H) 8 - 23 mg/dL   Creatinine, Ser 0.90 0.44 - 1.00 mg/dL   Glucose, Bld 117 (H) 70 - 99 mg/dL    Comment: Glucose reference range applies only to samples taken after fasting for at least 8 hours.   Calcium, Ion 1.10 (L) 1.15 - 1.40 mmol/L   TCO2 26 22 - 32 mmol/L   Hemoglobin 12.9 12.0 - 15.0 g/dL   HCT 38.0 36.0 - 46.0 %  CBG monitoring, ED     Status: Abnormal   Collection  Time: 02/22/21  4:33 PM  Result Value Ref Range   Glucose-Capillary 113 (H) 70 - 99 mg/dL    Comment: Glucose reference range applies only to samples taken after fasting for at least 8 hours.  MRSA Next Gen by PCR, Nasal     Status: None   Collection Time: 02/22/21  5:46 PM   Specimen: Nasal Mucosa; Nasal Swab  Result Value Ref Range   MRSA by PCR Next Gen NOT DETECTED NOT DETECTED    Comment: (NOTE) The GeneXpert MRSA Assay (FDA approved for NASAL specimens only), is one component of a comprehensive MRSA colonization surveillance program. It is not intended to diagnose MRSA infection nor to guide or monitor treatment for MRSA infections. Test performance is not FDA approved in patients less than 17 years old. Performed at [facility], [place] [address], [place], [place] [address]   Resp Panel by RT-PCR (Flu A&B, Covid) Nasopharyngeal Swab     Status: None   Collection Time: 02/22/21  5:53 PM   Specimen: Nasopharyngeal Swab; Nasopharyngeal(NP) swabs in vial transport medium  Result Value Ref Range   SARS Coronavirus 2 by RT PCR NEGATIVE NEGATIVE    Comment: (NOTE) SARS-CoV-2 target nucleic acids are NOT DETECTED.  The SARS-CoV-2 RNA is generally detectable in upper respiratory specimens during the acute phase of infection. The lowest concentration of SARS-CoV-2 viral copies this assay can detect is 138 copies/mL. A negative result does not preclude SARS-Cov-2 infection and should not be used as the sole basis for treatment or other patient management decisions. A negative result may occur with  improper specimen collection/handling, submission of specimen other than nasopharyngeal swab, presence of viral mutation(s) within the areas targeted by this assay, and inadequate number of viral copies(<138 copies/mL). A negative result must be combined with clinical observations, patient history, and epidemiological information. The expected result is Negative.  Fact Sheet for  Patients:  EntrepreneurPulse.com.au  Fact Sheet for Healthcare Providers:  IncredibleEmployment.be  This test is no t yet approved or cleared by the [place] FDA and  has been authorized for detection and/or diagnosis of SARS-CoV-2 by FDA under an Emergency Use Authorization (EUA). This EUA will remain  in effect (meaning this test can be used) for the duration of the COVID-19 declaration  under Section 564(b)(1) of the Act, 21 U.S.C.section 360bbb-3(b)(1), unless the authorization is terminated  or revoked sooner.       Influenza A by PCR NEGATIVE NEGATIVE   Influenza B by PCR NEGATIVE NEGATIVE    Comment: (NOTE) The Xpert Xpress SARS-CoV-2/FLU/RSV plus assay is intended as an aid in the diagnosis of influenza from Nasopharyngeal swab specimens and should not be used as a sole basis for treatment. Nasal washings and aspirates are unacceptable for Xpert Xpress SARS-CoV-2/FLU/RSV testing.  Fact Sheet for Patients: EntrepreneurPulse.com.au  Fact Sheet for Healthcare Providers: IncredibleEmployment.be  This test is not yet approved or cleared by the [place] FDA and has been authorized for detection and/or diagnosis of SARS-CoV-2 by FDA under an Emergency Use Authorization (EUA). This EUA will remain in effect (meaning this test can be used) for the duration of the COVID-19 declaration under Section 564(b)(1) of the Act, 21 U.S.C. section 360bbb-3(b)(1), unless the authorization is terminated or revoked.  Performed at [facility], [place] [address], [place], [place] [address]   Urine rapid drug screen (hosp performed)     Status: Abnormal   Collection Time: 02/22/21  7:11 PM  Result Value Ref Range   Opiates NONE DETECTED NONE DETECTED   Cocaine NONE DETECTED NONE DETECTED   Benzodiazepines NONE DETECTED NONE DETECTED   Amphetamines NONE DETECTED NONE DETECTED   Tetrahydrocannabinol  POSITIVE (A) NONE DETECTED   Barbiturates NONE DETECTED NONE DETECTED    Comment: (NOTE) DRUG SCREEN FOR MEDICAL PURPOSES ONLY.  IF CONFIRMATION IS NEEDED FOR ANY PURPOSE, NOTIFY LAB WITHIN 5 DAYS.  LOWEST DETECTABLE LIMITS FOR URINE DRUG SCREEN Drug Class                     Cutoff (ng/mL) Amphetamine and metabolites    1000 Barbiturate and metabolites    200 Benzodiazepine                 301 Tricyclics and metabolites     300 Opiates and metabolites        300 Cocaine and metabolites        300 THC                            50 Performed at [facility], [place] [address] [place], [place] [address]   Urinalysis, Routine w reflex microscopic Urine, Clean Catch     Status: Abnormal   Collection Time: 02/22/21  7:11 PM  Result Value Ref Range   Color, Urine YELLOW YELLOW   APPearance CLEAR CLEAR   Specific Gravity, Urine 1.043 (H) 1.005 - 1.030   pH 6.0 5.0 - 8.0   Glucose, UA NEGATIVE NEGATIVE mg/dL   Hgb urine dipstick NEGATIVE NEGATIVE   Bilirubin Urine NEGATIVE NEGATIVE   Ketones, ur NEGATIVE NEGATIVE mg/dL   Protein, ur NEGATIVE NEGATIVE mg/dL   Nitrite NEGATIVE NEGATIVE   Leukocytes,Ua NEGATIVE NEGATIVE    Comment: Performed at [facility] [address], [place], [place] [address]  Hemoglobin A1c     Status: Abnormal   Collection Time: 02/23/21  2:35 AM  Result Value Ref Range   Hgb A1c MFr Bld 5.7 (H) 4.8 - 5.6 %    Comment: (NOTE)         Prediabetes: 5.7 - 6.4         Diabetes: >6.4         Glycemic control for  adults with diabetes: <7.0    Mean Plasma Glucose 117 mg/dL    Comment: (NOTE) Performed At: [facility] [place] [place], [place] [id number] Rush Farmer MD [id number]   Lipid panel     Status: None   Collection Time: 02/23/21  2:35 AM  Result Value Ref Range   Cholesterol 151 0 - 200 mg/dL   Triglycerides 139 <150 mg/dL   HDL 46 >40 mg/dL   Total CHOL/HDL Ratio 3.3 RATIO   VLDL 28 0 - 40 mg/dL   LDL  Cholesterol 77 0 - 99 mg/dL    Comment:        Total Cholesterol/HDL:CHD Risk Coronary Heart Disease Risk Table                     Men   Women  1/2 Average Risk   3.4   3.3  Average Risk       5.0   4.4  2 X Average Risk   9.6   7.1  3 X Average Risk  23.4   11.0        Use the calculated Patient Ratio above and the CHD Risk Table to determine the patient's CHD Risk.        ATP III CLASSIFICATION (LDL):  <100     mg/dL   Optimal  100-129  mg/dL   Near or Above                    Optimal  130-159  mg/dL   Borderline  160-189  mg/dL   High  >190     mg/dL   Very High Performed at [facility] [address], [place], [place] [address]   CBC     Status: Abnormal   Collection Time: 02/23/21  2:35 AM  Result Value Ref Range   WBC 10.5 4.0 - 10.5 K/uL   RBC 4.14 3.87 - 5.11 MIL/uL   Hemoglobin 11.2 (L) 12.0 - 15.0 g/dL   HCT 34.7 (L) 36.0 - 46.0 %   MCV 83.8 80.0 - 100.0 fL   MCH 27.1 26.0 - 34.0 pg   MCHC 32.3 30.0 - 36.0 g/dL   RDW 14.6 11.5 - 15.5 %   Platelets 246 150 - 400 K/uL   nRBC 0.0 0.0 - 0.2 %    Comment: Performed at [facility], [place] [address], [place], [place] [address]  Comprehensive metabolic panel     Status: Abnormal   Collection Time: 02/23/21  2:35 AM  Result Value Ref Range   Sodium 136 135 - 145 mmol/L   Potassium 4.0 3.5 - 5.1 mmol/L   Chloride 101 98 - 111 mmol/L   CO2 28 22 - 32 mmol/L   Glucose, Bld 84 70 - 99 mg/dL    Comment: Glucose reference range applies only to samples taken after fasting for at least 8 hours.   BUN 19 8 - 23 mg/dL   Creatinine, Ser 0.80 0.44 - 1.00 mg/dL   Calcium 9.1 8.9 - 10.3 mg/dL   Total Protein 5.6 (L) 6.5 - 8.1 g/dL   Albumin 3.2 (L) 3.5 - 5.0 g/dL   AST 20 15 - 41 U/L   ALT 17 0 - 44 U/L   Alkaline Phosphatase 37 (L) 38 - 126 U/L   Total Bilirubin 0.4 0.3 - 1.2 mg/dL   GFR, Estimated >60 >60 mL/min    Comment: (NOTE) Calculated using the CKD-EPI Creatinine Equation (  2021)    Anion gap 7 5 -  15    Comment: Performed at [facility], [place] [address], [place], [place] [address]  ECHOCARDIOGRAM COMPLETE     Status: None   Collection Time: 02/23/21 10:59 AM  Result Value Ref Range   Weight 2,811.31 oz   Height 61 in   BP 144/82 mmHg   S' Lateral 3.40 cm   Area-P 1/2 2.03 cm2   Radius 0.30 cm   MV M vel 5.71 m/s   MV Peak grad 130.4 mmHg   Objective  Body mass index is 31.93 kg/m. Wt Readings from Last 3 Encounters:  03/16/21 169 lb (76.7 kg)  02/22/21 175 lb 11.3 oz (79.7 kg)  11/24/20 171 lb 12.8 oz (77.9 kg)   Temp Readings from Last 3 Encounters:  03/16/21 98 F (36.7 C) (Oral)  02/23/21 99.3 F (37.4 C) (Oral)  10/13/20 99.2 F (37.3 C) (Oral)   BP Readings from Last 3 Encounters:  03/16/21 126/80  02/23/21 132/81  11/24/20 125/76   Pulse Readings from Last 3 Encounters:  03/16/21 67  02/23/21 72  11/24/20 68    Physical Exam Vitals and nursing note reviewed.  Constitutional:      Appearance: Normal appearance. She is well-developed and well-groomed. She is obese.  HENT:     Head: Normocephalic and atraumatic.  Eyes:     Conjunctiva/sclera: Conjunctivae normal.     Pupils: Pupils are equal, round, and reactive to light.  Cardiovascular:     Rate and Rhythm: Normal rate and regular rhythm.     Heart sounds: Normal heart sounds. No murmur heard. Pulmonary:     Effort: Pulmonary effort is normal.     Breath sounds: Normal breath sounds.  Skin:    General: Skin is warm and dry.  Neurological:     General: No focal deficit present.     Mental Status: She is alert and oriented to person, place, and time. Mental status is at baseline.     Gait: Gait normal.  Psychiatric:        Attention and Perception: Attention and perception normal.        Mood and Affect: Mood and affect normal.        Speech: Speech normal.        Behavior: Behavior normal. Behavior is cooperative.        Thought Content: Thought content normal.        Cognition and  Memory: Cognition and memory normal.        Judgment: Judgment normal.    Assessment  Plan  Meningioma ([place]) 7 mm right parietal  Will call back when ready neurology consult will need to f/u Abnormal gait no more falls since 02/22/21   Prediabetes Healthy diet and exercise   Open wound abrasions to left forearm s/p fall- Plan: mupirocin ointment (BACTROBAN) 2 %  Fatigue, unspecified type Iron deficiency anemia, unspecified iron deficiency anemia type  Consider mvt with iron, B12 and vitamin D normal  Check iron at f/u  HM Flu shot utd prevnar utd pna had in 2017  2/2shingrix Tdap 04/07/19 covid 19 2/2 consider booster [facility]   Mammogram neg 01/12/2021  Out of age window pap 04/22/14 negative DEXA 06/10/18 osteopenia ordered DEXA same 01/12/21  Colonoscopy In 2007 hemorrhoids had FIT test negative 07/06/17 due at f/u 06/2020 or after resch 11/24/20 Dr. Vicente Males had and no further colonoscopies   Skin Dr. 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 Olivia Mackie McLean-Scocuzza-Internal Medicine

## 2021-04-07 ENCOUNTER — Other Ambulatory Visit: Payer: Self-pay | Admitting: Internal Medicine

## 2021-04-07 NOTE — Telephone Encounter (Signed)
Please schedule 6 month F/U appointment. Thank you! 

## 2021-04-07 NOTE — Telephone Encounter (Signed)
LMOV  

## 2021-05-09 ENCOUNTER — Other Ambulatory Visit: Payer: Self-pay | Admitting: Internal Medicine

## 2021-05-18 ENCOUNTER — Encounter: Payer: Self-pay | Admitting: *Deleted

## 2021-05-18 ENCOUNTER — Ambulatory Visit (INDEPENDENT_AMBULATORY_CARE_PROVIDER_SITE_OTHER): Payer: Medicare Other

## 2021-05-18 VITALS — Ht 61.0 in | Wt 169.0 lb

## 2021-05-18 DIAGNOSIS — Z0000 Encounter for general adult medical examination without abnormal findings: Secondary | ICD-10-CM

## 2021-05-18 NOTE — Patient Instructions (Addendum)
  Ms. Blevins , Thank you for taking time to come for your Medicare Wellness Visit. I appreciate your ongoing commitment to your health goals. Please review the following plan we discussed and let me know if I can assist you in the future.   These are the goals we discussed:  Goals       Patient Stated     I want to lose weight (pt-stated)      Less 167lb Stay active  Stay hydrated Portion control        This is a list of the screening recommended for you and due dates:  Health Maintenance  Topic Date Due   COVID-19 Vaccine (3 - Pfizer risk series) 10/26/2021*   Flu Shot  11/25/2021*   Tetanus Vaccine  04/06/2029   DEXA scan (bone density measurement)  Completed   Hepatitis C Screening: USPSTF Recommendation to screen - Ages 52-79 yo.  Completed   Zoster (Shingles) Vaccine  Completed   HPV Vaccine  Aged Out  *Topic was postponed. The date shown is not the original due date.

## 2021-05-18 NOTE — Progress Notes (Unsigned)
[facility] - 05/18/21 1400       Assessment    Assessment type Phone to patient    Is patient still in hospital? No    Date of hospital discharge after thrombolysis? 02/23/21      Final 90-Day Modified Rankin Score   Final 90-Day Modified Rankin Score: ([facility]) 0-No symptoms from stroke remain, but able to carry out all usual activities      [id number]   Mobility 2- slight problems in walking about    Self-care 1- no problems with Self-care    Usual activities 2- slight problems with performing usual activities    Pain/discomfort 2- slight pain or discomfort    Anxiety/Depression 2- slight anxious or depressed    What number between 0-100 best describes the patient's health state today (100 means the best health; 0 means the worst health)? [facility] Admission   In the Past 3 months (since your initial hospitalisation for stroke), have you been admitted to hospital (including day-only procedures) for any reason? No      Doctor consultations   In the past 3 months (since your initial hospitalisation for stroke), have you seen any doctors or other health professional (for example physiotherapy, outpatient nurse, general practitioner) for any reason? Yes      a. Doctor consultations   a. Type of service IM    a. Condition or purpose meningioma    a. Date of appointment 03/16/21

## 2021-05-18 NOTE — Progress Notes (Addendum)
Subjective:   Sharon Mcclure is a 80 y.o. female who presents for Medicare Annual (Subsequent) preventive examination.  Review of Systems    No ROS.  Medicare Wellness Virtual Visit.  Visual/audio telehealth visit, UTA vital signs.   See social history for additional risk factors.   Cardiac Risk Factors include: advanced age (>67men, >67 women)     Objective:    Today's Vitals   05/18/21 1037  Weight: 169 lb (76.7 kg)  Height: 5\' 1"  (1.549 m)   Body mass index is 31.93 kg/m.  Advanced Directives 05/18/2021 02/22/2021 05/17/2020 05/15/2019 05/13/2018 02/22/2016 01/25/2016  Does Patient Have a Medical Advance Directive? No - Yes No No Yes Yes  Type of Advance Directive - [place];Living will [place];Living will - - [place];Living will Living will  Does patient want to make changes to medical advance directive? - No - Patient declined No - Patient declined - - - -  Copy of [place] in Chart? - No - copy requested No - copy requested - - - -  Would patient like information on creating a medical advance directive? No - Patient declined - - Yes (MAU/Ambulatory/Procedural Areas - Information given) Yes (Inpatient - patient requests chaplain consult to create a medical advance directive) - -    Current Medications (verified) Outpatient Encounter Medications as of 05/18/2021  Medication Sig   amLODipine (NORVASC) 5 MG tablet Take 1 tablet (5 mg total) by mouth daily.   aspirin 81 MG tablet Take 81 mg by mouth daily.   Biotin 10000 MCG TABS Take 1 tablet by mouth daily.   buPROPion (WELLBUTRIN XL) 150 MG 24 hr tablet Take 1 tablet (150 mg total) by mouth daily.   Calcium Carb-Cholecalciferol (CALCIUM+D3 PO) Take 1 tablet by mouth daily.   citalopram (CELEXA) 20 MG tablet Take 1 tablet (20 mg total) by mouth daily.   cyanocobalamin 1000 MCG tablet Take 1,000 mcg by mouth daily.   Evolocumab (REPATHA SURECLICK)  371 MG/ML SOAJ Inject 140 mg into the skin every 14 (fourteen) days.   furosemide (LASIX) 20 MG tablet Take 1 tablet (20 mg total) by mouth daily as needed. (Patient not taking: Reported on 03/16/2021)   metroNIDAZOLE (METROCREAM) 0.75 % cream Apply topically 2 (two) times daily. (Patient not taking: Reported on 03/16/2021)   mupirocin ointment (BACTROBAN) 2 % Apply 1 application topically 2 (two) times daily. Left arm prn   pantoprazole (PROTONIX) 40 MG tablet Take 1 tablet (40 mg total) by mouth 2 (two) times daily before a meal. 30 minute before food   traZODone (DESYREL) 50 MG tablet Take 1 tablet (50 mg total) by mouth at bedtime as needed for sleep.   VITAMIN D, ERGOCALCIFEROL, PO Take 5,000 Units by mouth daily. (Patient not taking: Reported on 03/16/2021)   No facility-administered encounter medications on file as of 05/18/2021.    Allergies (verified) Ampicillin, Penicillins, Crestor [rosuvastatin calcium], Ezetimibe-simvastatin, Niaspan [niacin er], Vytorin [ezetimibe-simvastatin], Zetia [ezetimibe], Zocor [simvastatin], Fenofibrate, and Welchol [colesevelam hcl]   History: Past Medical History:  Diagnosis Date   Anxiety    CAD (coronary artery disease)    Cancer ([place])    basal cell-right nostril   Fatty liver    GERD (gastroesophageal reflux disease)    Heart murmur    Hemorrhoids    Hiatal hernia    History of skin cancer    Right arm   HTN (hypertension)    Hyperlipidemia  Insomnia    Insomnia    Pneumonia October 2016   Rosacea 11/24/2013   Vitamin D deficiency    Past Surgical History:  Procedure Laterality Date   APPENDECTOMY     BASAL CELL CARCINOMA EXCISION     right nostril   CATARACT EXTRACTION W/PHACO Left 01/25/2016   Procedure: CATARACT EXTRACTION PHACO AND INTRAOCULAR LENS PLACEMENT (IOC);  Surgeon: Birder Robson, MD;  Location: [facility] ORS;  Service: Ophthalmology;  Laterality: Left;  [place] 33.9 AP% 15.5 CDE 5.28 Fluid Pack Lot # [id number]   CATARACT  EXTRACTION W/PHACO Right 02/22/2016   Procedure: CATARACT EXTRACTION PHACO AND INTRAOCULAR LENS PLACEMENT (IOC);  Surgeon: Birder Robson, MD;  Location: [facility] ORS;  Service: Ophthalmology;  Laterality: Right;  [place] 00:40 AP% 23.6 CDE 9.45 fluid pack lot # [id number]   MOHS SURGERY     bcc right nasal ala 01/2020   TUBAL LIGATION  1981   Family History  Problem Relation Age of Onset   Heart disease Mother 25       aortic valve- had AVR- died 44 wks after   Hyperlipidemia Mother    Aneurysm Father 31       aortic aneursym   Heart disease Father    Heart disease Brother 60       CABG   Colon cancer Neg Hx    Stomach cancer Neg Hx    Social History   Socioeconomic History   Marital status: Single    Spouse name: Not on file   Number of children: Not on file   Years of education: Not on file   Highest education level: Not on file  Occupational History   Not on file  Tobacco Use   Smoking status: Former    Packs/day: 0.25    Years: 22.00    Pack years: 5.50    Types: Cigarettes    Quit date: 1980    Years since quitting: 42.7   Smokeless tobacco: Never  Vaping Use   Vaping Use: Never used  Substance and Sexual Activity   Alcohol use: No   Drug use: No   Sexual activity: Not on file  Other Topics Concern   Not on file  Social History Narrative   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    1 daughter    [place] up on a farm    Social Determinants of Radio broadcast assistant Strain: Low Risk    Difficulty of Paying Living Expenses: Not hard at all  Food Insecurity: No Food Insecurity   Worried About Charity fundraiser in the Last Year: Never true   Arboriculturist in the Last Year: Never true  Transportation Needs: No Transportation Needs   Lack of Transportation (Medical): No   Lack of Transportation (Non-Medical): No  Physical Activity: Insufficiently Active   Days of Exercise per Week: 2 days   Minutes of Exercise per Session: 20 min  Stress: No Stress Concern  Present   Feeling of Stress : Not at all  Social Connections: Unknown   Frequency of Communication with Friends and Family: More than three times a week   Frequency of Social Gatherings with Friends and Family: Not on file   Attends Religious Services: Not on file   Active Member of Clubs or Organizations: Not on file   Attends Archivist Meetings: Not on file   Marital Status: Not on file    Tobacco Counseling Counseling given: Not Answered   Clinical Intake:  Pre-visit preparation completed: Yes        Diabetes: No  How often do you need to have someone help you when you read instructions, pamphlets, or other written materials from your doctor or pharmacy?: 1 - Never   Interpreter Needed?: No      Activities of Daily Living In your present state of health, do you have any difficulty performing the following activities: 05/18/2021 02/22/2021  Hearing? N N  Vision? N N  Difficulty concentrating or making decisions? N N  Walking or climbing stairs? N Y  Dressing or bathing? N N  Doing errands, shopping? N N  Preparing Food and eating ? N -  Using the Toilet? N -  In the past six months, have you accidently leaked urine? Y -  Comment Managed with daily pad -  Do you have problems with loss of bowel control? N -  Managing your Medications? N -  Managing your Finances? N -  Housekeeping or managing your Housekeeping? N -  Some recent data might be hidden    Patient Care Team: McLean-Scocuzza, Nino Glow, MD as PCP - General (Internal Medicine) End, Harrell Gave, MD as PCP - Cardiology (Cardiology)  Indicate any recent Medical Services you may have received from other than [facility] providers in the past year (date may be approximate).     Assessment:   This is a routine wellness examination for [place].  I connected with Sharon Mcclure today by telephone and verified that I am speaking with the correct person using two identifiers. Location patient: home Location  provider: work Persons participating in the virtual visit: patient, Marine scientist.    I discussed the limitations, risks, security and privacy concerns of performing an evaluation and management service by telephone and the availability of in person appointments. The patient expressed understanding and verbally consented to this telephonic visit.    Interactive audio and video telecommunications were attempted between this provider and patient, however failed, due to patient having technical difficulties OR patient did not have access to video capability.  We continued and completed visit with audio only.  Some vital signs may be absent or patient reported.   Hearing/Vision screen Hearing Screening - Comments:: Denies difficulty hearing conversational tones. No hearing aids worn.  Vision Screening - Comments:: Wears corrective lenses  They have seen their ophthalmologist in the last 12 months.   Dietary issues and exercise activities discussed: Current Exercise Habits: Home exercise routine, Intensity: Mild Healthy diet Good water intake   Goals Addressed               This Visit's Progress     Patient Stated     I want to lose weight (pt-stated)   On track     Less 167lb Stay active  Stay hydrated Portion control       Depression Screen PHQ 2/9 Scores 05/18/2021 05/17/2020 04/07/2020 12/02/2019 05/30/2019 05/15/2019 05/13/2018  PHQ - 2 Score 0 0 0 3 0 0 4  PHQ- 9 Score - 0 0 9 - - 6    Fall Risk Fall Risk  05/18/2021 03/16/2021 10/13/2020 05/17/2020 04/07/2020  Falls in the past year? - 1 1 (No Data) 1  Comment - - - None since last reported less than 1 month ago. -  Number falls in past yr: - 1 1 - 0  Injury with Fall? - 0 1 - 0  Risk for fall due to : - History of fall(s) History of fall(s) - History of fall(s)  Follow  up Falls evaluation completed Falls evaluation completed Falls evaluation completed Falls evaluation completed Falls evaluation completed    [place]: Adequate lighting in your home to reduce risk of falls? Yes   ASSISTIVE DEVICES UTILIZED TO PREVENT FALLS: Life alert? No  Use of a cane, walker or w/c? No   TIMED UP AND GO: Was the test performed? No .   Cognitive Function:  Patient is alert and oriented x3.    6CIT Screen 05/17/2020 05/15/2019 05/13/2018  What Year? 0 points 0 points 0 points  What month? 0 points 0 points 0 points  What time? 0 points 0 points 0 points  Count back from 20 0 points 0 points 0 points  Months in reverse 0 points 0 points 0 points  Repeat phrase 2 points 0 points 2 points  Total Score 2 0 2    Immunizations Immunization History  Administered Date(s) Administered   Fluad Quad(high Dose 65+) 06/02/2019   Influenza Whole 05/29/2007   Influenza, High Dose Seasonal PF 06/01/2014, 04/26/2020   Influenza,inj,Quad PF,6+ Mos 06/29/2015, 05/13/2018   Influenza-Unspecified 05/28/2013   PFIZER(Purple Top)SARS-COV-2 Vaccination 09/26/2019, 10/17/2019   Pneumococcal Conjugate-13 04/22/2014   Pneumococcal Polysaccharide-23 06/12/2005, 09/13/2015   Tdap 04/07/2019   Zoster Recombinat (Shingrix) 03/06/2019, 05/21/2019   Zoster, Live 10/08/2007   Health Maintenance There are no preventive care reminders to display for this patient. Health Maintenance  Topic Date Due   COVID-19 Vaccine (3 - Pfizer risk series) 10/26/2021 (Originally 11/14/2019)   INFLUENZA VACCINE  11/25/2021 (Originally 03/28/2021)   TETANUS/TDAP  04/06/2029   DEXA SCAN  Completed   Hepatitis C Screening  Completed   Zoster Vaccines- Shingrix  Completed   HPV VACCINES  Aged Out   Mammogram- complete 01/13/21.  Vision Screening: Recommended annual ophthalmology exams for early detection of glaucoma and other disorders of the eye.  Dental Screening: Recommended annual dental exams for proper oral hygiene  Community Resource Referral / Chronic Care Management: CRR required this visit?  No   CCM required this  visit?  No      Plan:   Keep all routine maintenance appointments.   I have personally reviewed and noted the following in the patient's chart:   Medical and social history Use of alcohol, tobacco or illicit drugs  Current medications and supplements including opioid prescriptions. Not taking opioid.  Functional ability and status Nutritional status Physical activity Advanced directives List of other physicians Hospitalizations, surgeries, and ER visits in previous 12 months Vitals Screenings to include cognitive, depression, and falls Referrals and appointments  In addition, I have reviewed and discussed with patient certain preventive protocols, quality metrics, and best practice recommendations. A written personalized care plan for preventive services as well as general preventive health recommendations were provided to patient via mail.     OBrien-Blaney, Sharon Imbert Mcclure, Sharon Mcclure   12/14/6220     I have reviewed the above information and agree with above.   Deborra Medina, MD

## 2021-05-30 ENCOUNTER — Other Ambulatory Visit: Payer: Self-pay

## 2021-05-30 NOTE — Patient Outreach (Signed)
[place] [facility]) Care Management  05/30/2021  Sharon Mcclure [birth year] [id number]   Telephone outreach by Hollice Espy to patient to obtain mRS was successfully completed. MRS= 0   [place] Care Management Assistant

## 2021-06-08 ENCOUNTER — Other Ambulatory Visit: Payer: Self-pay

## 2021-06-08 ENCOUNTER — Ambulatory Visit: Payer: Medicare Other | Admitting: Internal Medicine

## 2021-06-08 ENCOUNTER — Encounter: Payer: Self-pay | Admitting: Internal Medicine

## 2021-06-08 VITALS — BP 140/78 | HR 62 | Ht 60.0 in | Wt 170.0 lb

## 2021-06-08 DIAGNOSIS — Z8673 Personal history of transient ischemic attack (TIA), and cerebral infarction without residual deficits: Secondary | ICD-10-CM

## 2021-06-08 DIAGNOSIS — E785 Hyperlipidemia, unspecified: Secondary | ICD-10-CM

## 2021-06-08 DIAGNOSIS — I10 Essential (primary) hypertension: Secondary | ICD-10-CM

## 2021-06-08 DIAGNOSIS — I2510 Atherosclerotic heart disease of native coronary artery without angina pectoris: Secondary | ICD-10-CM

## 2021-06-08 NOTE — Progress Notes (Signed)
Follow-up Outpatient Visit Date: 06/08/2021  Primary Care Provider: McLean-Scocuzza, Nino Glow, MD [place] [address]  Chief Complaint: Follow-up coronary artery disease and hyperlipidemia  HPI:  Sharon Mcclure is a 80 y.o. female with history of coronary artery disease (coronary artery calcification previously noted but no ischemia or scar on Myoview in 05/2019), questionable stroke (01/2021) hypertension, hyperlipidemia, hiatal hernia, fatty liver, and anxiety, who presents for follow-up of coronary artery disease and hypertension.  I last saw her in January, at which time she with feeling fairly well, though she noted worsening indigestion.  She switched from bempedoic acid to Repatha due to myalgias associated with bempedoic acid.  She presented to the [facility] emergency department on 02/22/2021 with acute aphasia and incoordinated gait.  She was treated with thrombolytics.  Her neurologic deficits resolved.  In hindsight, it was felt that her symptoms may have been due to [place] ingestion, as she accidentally consumed her son's edibles.  Today, Ms. Mayer reports that she is feeling fairly well.  She denies chest pain, shortness of breath, palpitations, and edema.  She notes occasional dizziness and sometimes feels off balance.  She has had a few mechanical falls over the last year but fortunately no significant injuries.  She reports being compliant with her medications, though she recently ran out of Repatha and has been unable to refill this as she is now in the donut hole.  --------------------------------------------------------------------------------------------------  Cardiovascular History & Procedures: Cardiovascular Problems: Coronary artery calcification Hyperlipidemia Chronic dyspnea on exertion   Risk Factors: Coronary artery calcification, hypertension, hyperlipidemia, obesity, and age greater than 53   Cath/PCI: None   CV Surgery: None   EP  Procedures and Devices: None   Non-Invasive Evaluation(s): TTE (02/23/2021): Normal LV size with mild LVH.  LVEF 55-60% with grade 1 diastolic dysfunction.  Normal RV size and wall thickness.  Normal PA pressure.  Mild-moderate left atrial enlargement.  Bileaflet mitral valve prolapse with moderate regurgitation noted.  Mild-moderate mitral annular calcification.  Mild tricuspid regurgitation.  Normal CVP.  Cannot exclude small PFO. TTE (09/03/2019): Normal LV size and wall thickness.  LVEF 60-65% with grade 1 diastolic dysfunction and normal wall motion.  Normal RV size and function.  Mild to moderate left atrial enlargement.  Aortic sclerosis without stenosis.  Mild pulmonary hypertension. Pharmacologic MPI (06/25/2019): Intermediate risk study with calculated LVEF <30% (normal LV function by visual estimation).  No evidence of significant ischemia or scar.  Recent CV Pertinent Labs: Lab Results  Component Value Date   CHOL 151 02/23/2021   CHOL 196 09/17/2019   HDL 46 02/23/2021   HDL 55 09/17/2019   LDLCALC 77 02/23/2021   LDLCALC 112 (H) 09/17/2019   LDLCALC 203 (H) 05/13/2018   TRIG 139 02/23/2021   CHOLHDL 3.3 02/23/2021   INR 1.0 02/22/2021   K 4.0 02/23/2021   K 3.6 04/01/2012   MG 1.8 04/30/2019   BUN 19 02/23/2021   BUN 22 (H) 04/01/2012   CREATININE 0.80 02/23/2021   CREATININE 0.77 05/13/2018    Past medical and surgical history were reviewed and updated in EPIC.  Current Meds  Medication Sig   amLODipine (NORVASC) 5 MG tablet Take 1 tablet (5 mg total) by mouth daily.   aspirin 81 MG tablet Take 81 mg by mouth daily.   Biotin 10000 MCG TABS Take 1 tablet by mouth daily.   buPROPion (WELLBUTRIN XL) 150 MG 24 hr tablet Take 1 tablet (150 mg total) by mouth daily.  Calcium Carb-Cholecalciferol (CALCIUM+D3 PO) Take 1 tablet by mouth daily.   citalopram (CELEXA) 20 MG tablet Take 1 tablet (20 mg total) by mouth daily.   Coenzyme Q10 (CO Q 10 PO) Take 2 capsules by mouth  daily.   cyanocobalamin 1000 MCG tablet Take 1,000 mcg by mouth daily.   furosemide (LASIX) 20 MG tablet Take 1 tablet (20 mg total) by mouth daily as needed.   metroNIDAZOLE (METROCREAM) 0.75 % cream Apply 1 application topically 2 (two) times daily as needed.   mupirocin ointment (BACTROBAN) 2 % Apply 1 application topically 2 (two) times daily. Left arm prn   pantoprazole (PROTONIX) 40 MG tablet Take 1 tablet (40 mg total) by mouth 2 (two) times daily before a meal. 30 minute before food   traZODone (DESYREL) 50 MG tablet Take 1 tablet (50 mg total) by mouth at bedtime as needed for sleep.   VITAMIN D, ERGOCALCIFEROL, PO Take 5,000 Units by mouth daily.    Allergies: Ampicillin, Penicillins, Crestor [rosuvastatin calcium], Ezetimibe-simvastatin, Niaspan [niacin er], Vytorin [ezetimibe-simvastatin], Zetia [ezetimibe], Zocor [simvastatin], Fenofibrate, and Welchol [colesevelam hcl]  Social History   Tobacco Use   Smoking status: Former    Packs/day: 0.25    Years: 22.00    Pack years: 5.50    Types: Cigarettes    Quit date: 1980    Years since quitting: 42.8   Smokeless tobacco: Never  Vaping Use   Vaping Use: Never used  Substance Use Topics   Alcohol use: No   Drug use: No    Family History  Problem Relation Age of Onset   Heart disease Mother 29       aortic valve- had AVR- died 76 wks after   Hyperlipidemia Mother    Aneurysm Father 71       aortic aneursym   Heart disease Father    Heart disease Brother 32       CABG   Colon cancer Neg Hx    Stomach cancer Neg Hx     Review of Systems: A 12-system review of systems was performed and was negative except as noted in the HPI.  --------------------------------------------------------------------------------------------------  Physical Exam: BP 140/78 (BP Location: Left Arm, Patient Position: Sitting, Cuff Size: Normal)   Pulse 62   Ht 5' (1.524 m)   Wt 170 lb (77.1 kg)   SpO2 97%   BMI 33.20 kg/m   General:   NAD. Neck: No JVD or HJR. Lungs: Clear to auscultation bilaterally without wheezes or crackles. Heart: Regular rate and rhythm without murmurs, rubs, or gallops. Abdomen: Soft, nontender, nondistended. Extremities: No lower extremity edema.  EKG: Normal sinus rhythm with low voltage.  Compared with prior tracing from 09/22/2020, PVCs are no longer present.  Otherwise, no significant interval change.  Lab Results  Component Value Date   WBC 10.5 02/23/2021   HGB 11.2 (L) 02/23/2021   HCT 34.7 (L) 02/23/2021   MCV 83.8 02/23/2021   PLT 246 02/23/2021    Lab Results  Component Value Date   NA 136 02/23/2021   K 4.0 02/23/2021   CL 101 02/23/2021   CO2 28 02/23/2021   BUN 19 02/23/2021   CREATININE 0.80 02/23/2021   GLUCOSE 84 02/23/2021   ALT 17 02/23/2021    Lab Results  Component Value Date   CHOL 151 02/23/2021   HDL 46 02/23/2021   LDLCALC 77 02/23/2021   TRIG 139 02/23/2021   CHOLHDL 3.3 02/23/2021    --------------------------------------------------------------------------------------------------  ASSESSMENT AND PLAN:  Coronary artery disease: No angina reported.  Patient noted to have coronary artery calcification but no ischemia or scar on myocardial perfusion stress test in 2020 (despite report of remote MI).  Aspirin, amlodipine, and Repatha (see details below).  Questionable stroke: No new neurologic symptoms reported.  In hindsight, event felt to be due to inadvertent intoxication.  No further work-up recommended at this time.  Hyperlipidemia: Lipids reasonable at 77 on last check in June, though Ms. Mccaughey is now out of [place].  We have provided her with information for the patient assistance line at [place].  I will also contact our pharmacy team to see if they have any resources to help bridge the donut hole for Ms. Bobbye Charleston.  Hypertension: Blood pressure borderline elevated today but typically better at other MD visits.  Continue current dose of  amlodipine.  Follow-up: Return to clinic in 6 months.  Nelva Bush, MD 06/08/2021 10:50 AM

## 2021-06-08 NOTE — Patient Instructions (Signed)
Medication Instructions:   Your physician recommends that you continue on your current medications as directed. Please refer to the Current Medication list given to you today.  For assistance regarding Repatha you may call: [phone]   *If you need a refill on your cardiac medications before your next appointment, please call your pharmacy*   Lab Work:  None ordered  Testing/Procedures:  None ordered   Follow-Up: At [facility], you and your health needs are our priority.  As part of our continuing mission to provide you with exceptional heart care, we have created designated Provider Care Teams.  These Care Teams include your primary Cardiologist (physician) and Advanced Practice Providers (APPs -  Physician Assistants and Nurse Practitioners) who all work together to provide you with the care you need, when you need it.  We recommend signing up for the patient portal called "MyChart".  Sign up information is provided on this After Visit Summary.  MyChart is used to connect with patients for Virtual Visits (Telemedicine).  Patients are able to view lab/test results, encounter notes, upcoming appointments, etc.  Non-urgent messages can be sent to your provider as well.   To learn more about what you can do with MyChart, go to [email or web address].    Your next appointment:   6 month(s)  The format for your next appointment:   In Person  Provider:   You may see Nelva Bush, MD or one of the following Advanced Practice Providers on your designated Care Team:   Murray Hodgkins, NP Christell Faith, PA-C Marrianne Mood, PA-C [place], [place]

## 2021-07-20 ENCOUNTER — Ambulatory Visit: Payer: Medicare Other | Admitting: Internal Medicine

## 2021-09-16 ENCOUNTER — Encounter: Payer: Self-pay | Admitting: Internal Medicine

## 2021-09-16 ENCOUNTER — Telehealth: Payer: Self-pay | Admitting: Internal Medicine

## 2021-09-16 NOTE — Telephone Encounter (Signed)
For your information  

## 2021-09-16 NOTE — Telephone Encounter (Signed)
Pt daughter called in stating that Pt tested positive for Covid last night. Pt daughter stated that Pt took an at home test. Pt daughter stated that Pt has a high fever. Yesterday Pt had high fever of 103.0. Pt daughter stated that Pt is experiencing body aches, back and legs hurts, and bad cough. Transfer Pt to access nurse. No avail appt today. Pt doesn't have access to VV. Pt has an appt already schedule for 09/21/2021 at 10:30am.

## 2021-09-16 NOTE — Telephone Encounter (Signed)
Agree pt would be good candidate for molnupiravir rec go to ED or urgent care   If needing prescription strength medication we will need to make an appointment with a provider.  These are over the counter medication options:  Mucinex dm green label for cough or robitussin DM  Multivitamin or below vitamins  Vitamin C 1000 mg daily.  Vitamin D3 4000 Iu (units) daily.  Zinc 100 mg daily.  Quercetin 250-500 mg 2 times per day   Elderberry  Oil of oregano  cepacol or chloroseptic spray Warm salt water gargles +hydrogen peroxide Sugar free cough drops  Warm tea with honey and lemon  Hydration  Try to eat though you dont feel like it   Tylenol or Advil  Nasal saline and Flonase 2 sprays nasal congestion  If sneezing/runny nose over the counter allergy pill claritin,allegra, zyrtec, xyzal Quarantine x 10-14 days 14 days preferred   Monitor pulse oximeter, buy from [place] if oxygen is less than 90 please go to the hospital.        Are you feeling really sick? Shortness of breath, cough, chest pain?, dizziness? Confusion   If so let me know  If worsening, go to hospital or [facility] clinic Urgent care for further treatment.

## 2021-09-16 NOTE — Telephone Encounter (Signed)
Patient was informed by access nurse to be seen within 4 hours. Informed that there were no available appointments and to be taken to the urgent care.   Patient's daughter states they will take the Patient to urgent care

## 2021-09-19 NOTE — Telephone Encounter (Signed)
Called to verify how patient is since no ED or UC recorded patient doing better afebrile since Saturday, just ha scough, nasal drainage gave PCP recommendation changed appt on 09/21/21 to virtual / telephone.

## 2021-09-19 NOTE — Telephone Encounter (Signed)
Noted  

## 2021-09-21 ENCOUNTER — Telehealth (INDEPENDENT_AMBULATORY_CARE_PROVIDER_SITE_OTHER): Payer: Medicare Other | Admitting: Internal Medicine

## 2021-09-21 ENCOUNTER — Other Ambulatory Visit: Payer: Self-pay

## 2021-09-21 ENCOUNTER — Encounter: Payer: Self-pay | Admitting: Internal Medicine

## 2021-09-21 VITALS — Ht 60.0 in | Wt 165.0 lb

## 2021-09-21 DIAGNOSIS — U071 COVID-19: Secondary | ICD-10-CM | POA: Diagnosis not present

## 2021-09-21 NOTE — Progress Notes (Signed)
Telephone Note  I connected with Sharon Mcclure  on 09/21/21 at 10:50 AM EST by telephone  and verified that I am speaking with the correct person using two identifiers.  Location patient: Sharon Mcclure Location provider:work or home office Persons participating in the virtual visit: patient, provider  I discussed the limitations and requested verbal permission for telemedicine visit. The patient expressed understanding and agreed to proceed.   HPI:  Acute telemedicine visit for : Tested positive last Thursday 09/15/21 with a home test. Patient was having symptoms onset of Wednesday night prior Including runny nose, body aches, and headaches. Patient had fever, diarrhea, fatigue, and loss of taste/smell as well. Family members tested + daughter and significant other, and grandson. She has not been wearing a mask  C/o physical weakness declines PT for now  Tried NS and otc nasal steroid tylenol alternating with ibuprofen  Denies sinus xs  She had wheezing but it resolved  Wants Rx handicap sticker    -Pertinent past medical history: see below -Pertinent medication allergies: Allergies  Allergen Reactions   Ampicillin Hives, Shortness Of Breath and Swelling    Has patient had a PCN reaction causing immediate rash, facial/tongue/throat swelling, SOB or lightheadedness with hypotension: Yes Has patient had a PCN reaction causing severe rash involving mucus membranes or skin necrosis: No Has patient had a PCN reaction that required hospitalization No Has patient had a PCN reaction occurring within the last 10 years: No If all of the above answers are "NO", then may proceed with Cephalosporin use.    Penicillins Hives and Shortness Of Breath    Has patient had a PCN reaction causing immediate rash, facial/tongue/throat swelling, SOB or lightheadedness with hypotension: Yes Has patient had a PCN reaction causing severe rash involving mucus membranes or skin necrosis: No Has patient had a PCN reaction that  required hospitalization No Has patient had a PCN reaction occurring within the last 10 years: No If all of the above answers are "NO", then may proceed with Cephalosporin use.    Crestor [Rosuvastatin Calcium]     Burning sensation   Ezetimibe-Simvastatin    Niaspan [Niacin Er]     Fatigues, aches.   Vytorin [Ezetimibe-Simvastatin]    Zetia [Ezetimibe]    Zocor [Simvastatin]    Fenofibrate     Constipation and confusion   Welchol [Colesevelam Hcl] Other (See Comments)    Severe bloating even on low dose   -COVID-19 vaccine status:  Immunization History  Administered Date(s) Administered   Fluad Quad(high Dose 65+) 06/02/2019   Influenza Whole 05/29/2007   Influenza, High Dose Seasonal PF 06/01/2014, 04/26/2020   Influenza,inj,Quad PF,6+ Mos 06/29/2015, 05/13/2018   Influenza-Unspecified 05/28/2013, 05/28/2021   PFIZER(Purple Top)SARS-COV-2 Vaccination 09/26/2019, 10/17/2019   Pneumococcal Conjugate-13 04/22/2014   Pneumococcal Polysaccharide-23 06/12/2005, 09/13/2015   Tdap 04/07/2019   Zoster Recombinat (Shingrix) 03/06/2019, 05/21/2019   Zoster, Live 10/08/2007     ROS: See pertinent positives and negatives per HPI.  Past Medical History:  Diagnosis Date   Anxiety    CAD (coronary artery disease)    Cancer ([place])    basal cell-right nostril   COVID-19    09/15/21   Fatty liver    GERD (gastroesophageal reflux disease)    Heart murmur    Hemorrhoids    Hiatal hernia    History of skin cancer    Right arm   HTN (hypertension)    Hyperlipidemia    Insomnia    Insomnia    Pneumonia 05/2015  Rosacea 11/24/2013   Vitamin D deficiency     Past Surgical History:  Procedure Laterality Date   APPENDECTOMY     BASAL CELL CARCINOMA EXCISION     right nostril   CATARACT EXTRACTION W/PHACO Left 01/25/2016   Procedure: CATARACT EXTRACTION PHACO AND INTRAOCULAR LENS PLACEMENT (IOC);  Surgeon: Birder Robson, MD;  Location: [facility] ORS;  Service: Ophthalmology;   Laterality: Left;  [place] 33.9 AP% 15.5 CDE 5.28 Fluid Pack Lot # [id number]   CATARACT EXTRACTION W/PHACO Right 02/22/2016   Procedure: CATARACT EXTRACTION PHACO AND INTRAOCULAR LENS PLACEMENT (IOC);  Surgeon: Birder Robson, MD;  Location: [facility] ORS;  Service: Ophthalmology;  Laterality: Right;  [place] 00:40 AP% 23.6 CDE 9.45 fluid pack lot # [id number]   MOHS SURGERY     bcc right nasal ala 01/2020   TUBAL LIGATION  1981     Current Outpatient Medications:    amLODipine (NORVASC) 5 MG tablet, Take 1 tablet (5 mg total) by mouth daily., Disp: 90 tablet, Rfl: 3   aspirin 81 MG tablet, Take 81 mg by mouth daily., Disp: , Rfl:    Biotin 10000 MCG TABS, Take 1 tablet by mouth daily., Disp: , Rfl:    buPROPion (WELLBUTRIN XL) 150 MG 24 hr tablet, Take 1 tablet (150 mg total) by mouth daily., Disp: 90 tablet, Rfl: 3   Calcium Carb-Cholecalciferol (CALCIUM+D3 PO), Take 1 tablet by mouth daily., Disp: , Rfl:    citalopram (CELEXA) 20 MG tablet, Take 1 tablet (20 mg total) by mouth daily., Disp: 90 tablet, Rfl: 3   Coenzyme Q10 (CO Q 10 PO), Take 2 capsules by mouth daily., Disp: , Rfl:    cyanocobalamin 1000 MCG tablet, Take 1,000 mcg by mouth daily., Disp: , Rfl:    metroNIDAZOLE (METROCREAM) 0.75 % cream, Apply 1 application topically 2 (two) times daily as needed., Disp: , Rfl:    mupirocin ointment (BACTROBAN) 2 %, Apply 1 application topically 2 (two) times daily. Left arm prn, Disp: 30 g, Rfl: 0   pantoprazole (PROTONIX) 40 MG tablet, Take 1 tablet (40 mg total) by mouth 2 (two) times daily before a meal. 30 minute before food, Disp: 180 tablet, Rfl: 3   traZODone (DESYREL) 50 MG tablet, Take 1 tablet (50 mg total) by mouth at bedtime as needed for sleep., Disp: 90 tablet, Rfl: 3   VITAMIN D, ERGOCALCIFEROL, PO, Take 5,000 Units by mouth daily., Disp: , Rfl:    Evolocumab (REPATHA SURECLICK) 951 MG/ML SOAJ, Inject 140 mg into the skin every 14 (fourteen) days. (Patient not taking: Reported on  06/08/2021), Disp: 2 mL, Rfl: 0   furosemide (LASIX) 20 MG tablet, Take 1 tablet (20 mg total) by mouth daily as needed. (Patient not taking: Reported on 09/21/2021), Disp: 30 tablet, Rfl: 5  EXAM:  VITALS per patient if applicable:  GENERAL: alert, oriented, appears well and in no acute distress  PSYCH/NEURO: pleasant and cooperative, no obvious depression or anxiety, speech and thought processing grossly intact  ASSESSMENT AND PLAN:  Discussed the following assessment and plan:  COVID-19 doing 80% better out of covid 19 window If needing prescription strength medication we will need to make an appointment with a provider.  These are over the counter medication options:  Mucinex dm green label for cough or robitussin DM  Multivitamin or below vitamins  Vitamin C 1000 mg daily.  Vitamin D3 4000 Iu (units) daily.  Zinc 100 mg daily.  Quercetin 250-500 mg 2 times per day   Kendall Flack  Oil of oregano  cepacol or chloroseptic spray Warm salt water gargles +hydrogen peroxide Sugar free cough drops  Warm tea with honey and lemon  Hydration  Try to eat though you dont feel like it   Tylenol or Advil  Nasal saline and Flonase 2 sprays nasal congestion  If sneezing/runny nose over the counter allergy pill claritin,allegra, zyrtec, xyzal Quarantine x 10-14 days 14 days preferred   Monitor pulse oximeter, buy from [place] if oxygen is less than 90 please go to the hospital.        Are you feeling really sick? Shortness of breath, cough, chest pain?, dizziness? Confusion   If so let me know  If worsening, go to hospital or [facility] clinic Urgent care for further treatment.     Rx handicap sticker  Declines PT for now slowly getting better  HM  See prior  Declines further covid 19 vaccines   -we discussed possible serious and likely etiologies, options for evaluation and workup, limitations of telemedicine visit vs in person visit, treatment, treatment risks and precautions. Pt is  agreeable to treatment via telemedicine at this moment.    I discussed the assessment and treatment plan with the patient. The patient was provided an opportunity to ask questions and all were answered. The patient agreed with the plan and demonstrated an understanding of the instructions.    Time spent 10 minutes  Delorise Jackson, MD

## 2021-09-21 NOTE — Patient Instructions (Signed)

## 2021-09-23 ENCOUNTER — Other Ambulatory Visit: Payer: Self-pay | Admitting: Internal Medicine

## 2021-11-08 ENCOUNTER — Other Ambulatory Visit: Payer: Self-pay | Admitting: Internal Medicine

## 2021-11-08 DIAGNOSIS — K449 Diaphragmatic hernia without obstruction or gangrene: Secondary | ICD-10-CM

## 2021-11-08 DIAGNOSIS — G4700 Insomnia, unspecified: Secondary | ICD-10-CM

## 2021-11-08 DIAGNOSIS — K219 Gastro-esophageal reflux disease without esophagitis: Secondary | ICD-10-CM

## 2021-11-08 DIAGNOSIS — F419 Anxiety disorder, unspecified: Secondary | ICD-10-CM

## 2021-11-08 DIAGNOSIS — F32A Depression, unspecified: Secondary | ICD-10-CM

## 2021-11-08 DIAGNOSIS — I10 Essential (primary) hypertension: Secondary | ICD-10-CM

## 2021-12-07 ENCOUNTER — Ambulatory Visit: Payer: Medicare Other | Admitting: Internal Medicine

## 2021-12-07 ENCOUNTER — Encounter: Payer: Self-pay | Admitting: Internal Medicine

## 2021-12-07 VITALS — BP 130/78 | HR 64 | Ht 61.0 in | Wt 170.0 lb

## 2021-12-07 DIAGNOSIS — I2510 Atherosclerotic heart disease of native coronary artery without angina pectoris: Secondary | ICD-10-CM | POA: Diagnosis not present

## 2021-12-07 DIAGNOSIS — E785 Hyperlipidemia, unspecified: Secondary | ICD-10-CM | POA: Diagnosis not present

## 2021-12-07 DIAGNOSIS — Z8673 Personal history of transient ischemic attack (TIA), and cerebral infarction without residual deficits: Secondary | ICD-10-CM

## 2021-12-07 DIAGNOSIS — R609 Edema, unspecified: Secondary | ICD-10-CM | POA: Diagnosis not present

## 2021-12-07 DIAGNOSIS — I10 Essential (primary) hypertension: Secondary | ICD-10-CM | POA: Diagnosis not present

## 2021-12-07 NOTE — Patient Instructions (Signed)
Medication Instructions:  ? ?Your physician recommends that you continue on your current medications as directed. Please refer to the Current Medication list given to you today. ? ?*If you need a refill on your cardiac medications before your next appointment, please call your pharmacy* ? ? ?Lab Work: ? ?None ordered ? ?Testing/Procedures: ? ?None ordered ? ? ?Follow-Up: ?At [facility], you and your health needs are our priority.  As part of our continuing mission to provide you with exceptional heart care, we have created designated Provider Care Teams.  These Care Teams include your primary Cardiologist (physician) and Advanced Practice Providers (APPs -  Physician Assistants and Nurse Practitioners) who all work together to provide you with the care you need, when you need it. ? ?We recommend signing up for the patient portal called "MyChart".  Sign up information is provided on this After Visit Summary.  MyChart is used to connect with patients for Virtual Visits (Telemedicine).  Patients are able to view lab/test results, encounter notes, upcoming appointments, etc.  Non-urgent messages can be sent to your provider as well.   ?To learn more about what you can do with MyChart, go to [email or web address].   ? ?Your next appointment:   ?1 year(s) ? ?The format for your next appointment:   ?In Person ? ?Provider:   ?You may see Christopher End, MD or one of the following Advanced Practice Providers on your designated Care Team:   ?Christopher Berge, NP ?Ryan Dunn, PA-C ?Cadence Furth, PA-C ? ?Important Information About Sugar ? ? ? ? ? ? ?

## 2021-12-07 NOTE — Progress Notes (Signed)
? ?Follow-up Outpatient Visit ?Date: 12/07/2021 ? ?Primary Care Provider: ?McLean-Scocuzza, Nino Glow, MD ?[address] ?[place] [address] ? ?Chief Complaint: Follow-up CAD and hypertension ? ?HPI:  Sharon Mcclure is a 81 y.o. female with history of coronary artery disease (coronary artery calcification previously noted but no ischemia or scar on Myoview in 05/2019), questionable stroke (01/2021) hypertension, hyperlipidemia, hiatal hernia, fatty liver, and anxiety, who presents for follow-up of coronary artery disease and hypertension.  I last saw her in 05/2021, at which time Sharon Mcclure was feeling well other than occasional dizziness and feeling of being off balance. ? ?Today, Sharon Mcclure reports that she feels about the same.  She still has some balance problems but denies frank lightheadedness.  She is concerned about some fluid retention, particularly in her upper body and hands.  She also notes occasional shortness of breath that accompanies her swelling.  Edema/dyspnea seem to be related to her diet.  She has not taken her as needed furosemide out of concerns for urinary frequency.  She denies chest pain and palpitations. ? ?-------------------------------------------------------------------------------------------------- ? ?Cardiovascular History & Procedures: ?Cardiovascular Problems: ?Coronary artery calcification ?Hyperlipidemia ?Chronic dyspnea on exertion ?  ?Risk Factors: ?Coronary artery calcification, hypertension, hyperlipidemia, obesity, and age greater than 31 ?  ?Cath/PCI: ?None ?  ?CV Surgery: ?None ?  ?EP Procedures and Devices: ?None ?  ?Non-Invasive Evaluation(s): ?TTE (02/23/2021): Normal LV size with mild LVH.  LVEF 55-60% with grade 1 diastolic dysfunction.  Normal RV size and wall thickness.  Normal PA pressure.  Mild-moderate left atrial enlargement.  Bileaflet mitral valve prolapse with moderate regurgitation noted.  Mild-moderate mitral annular calcification.  Mild tricuspid  regurgitation.  Normal CVP.  Cannot exclude small PFO. ?TTE (09/03/2019): Normal LV size and wall thickness.  LVEF 60-65% with grade 1 diastolic dysfunction and normal wall motion.  Normal RV size and function.  Mild to moderate left atrial enlargement.  Aortic sclerosis without stenosis.  Mild pulmonary hypertension. ?Pharmacologic MPI (06/25/2019): Intermediate risk study with calculated LVEF <30% (normal LV function by visual estimation).  No evidence of significant ischemia or scar. ? ?Recent CV Pertinent Labs: ?Lab Results  ?Component Value Date  ? CHOL 151 02/23/2021  ? CHOL 196 09/17/2019  ? HDL 46 02/23/2021  ? HDL 55 09/17/2019  ? [place] 77 02/23/2021  ? LDLCALC 112 (H) 09/17/2019  ? LDLCALC 203 (H) 05/13/2018  ? TRIG 139 02/23/2021  ? CHOLHDL 3.3 02/23/2021  ? INR 1.0 02/22/2021  ? K 4.0 02/23/2021  ? K 3.6 04/01/2012  ? MG 1.8 04/30/2019  ? BUN 19 02/23/2021  ? BUN 22 (H) 04/01/2012  ? CREATININE 0.80 02/23/2021  ? CREATININE 0.77 05/13/2018  ? ? ?Past medical and surgical history were reviewed and updated in EPIC. ? ?Current Meds  ?Medication Sig  ? amLODipine (NORVASC) 5 MG tablet TAKE 1 TABLET BY MOUTH ONCE A DAY  ? aspirin 81 MG tablet Take 81 mg by mouth daily.  ? Biotin 10000 MCG TABS Take 1 tablet by mouth daily.  ? buPROPion (WELLBUTRIN XL) 150 MG 24 hr tablet TAKE 1 TABLET BY MOUTH ONCE A DAY  ? Calcium Carb-Cholecalciferol (CALCIUM+D3 PO) Take 1 tablet by mouth daily.  ? citalopram (CELEXA) 20 MG tablet TAKE 1 TABLET BY MOUTH ONCE A DAY  ? Coenzyme Q10 (CO Q 10 PO) Take 2 capsules by mouth daily.  ? furosemide (LASIX) 20 MG tablet Take 1 tablet (20 mg total) by mouth daily as needed.  ? Multiple Vitamin (MULTIVITAMIN)  capsule Take 1 capsule by mouth daily.  ? mupirocin ointment (BACTROBAN) 2 % Apply 1 application topically 2 (two) times daily. Left arm prn  ? Nutritional Supplements (FRUIT & VEGETABLE DAILY) CAPS Take by mouth daily.  ? pantoprazole (PROTONIX) 40 MG tablet TAKE 1 TABLET BY MOUTH  TWICE A DAY BEFORE A MEAL AND BEFORE FOOD  ? REPATHA SURECLICK 466 MG/ML SOAJ INJECT 1 PEN INTO THE SKIN EVERY 14 DAYSSCHEDULE OFFICE VISIT FOR REFILLS  ? traZODone (DESYREL) 50 MG tablet TAKE 1 TABLET BY MOUTH EVERY NIGHT AT BEDTIME FOR SLEEP  ? VITAMIN D, ERGOCALCIFEROL, PO Take 5,000 Units by mouth daily.  ? ? ?Allergies: Ampicillin, Penicillins, Crestor [rosuvastatin calcium], Ezetimibe-simvastatin, Niaspan [niacin er], Vytorin [ezetimibe-simvastatin], Zetia [ezetimibe], Zocor [simvastatin], Fenofibrate, and Welchol [colesevelam hcl] ? ?Social History  ? ?Tobacco Use  ? Smoking status: Former  ?  Packs/day: 0.25  ?  Years: 22.00  ?  Pack years: 5.50  ?  Types: Cigarettes  ?  Quit date: 29  ?  Years since quitting: 43.3  ? Smokeless tobacco: Never  ?Vaping Use  ? Vaping Use: Never used  ?Substance Use Topics  ? Alcohol use: No  ? Drug use: No  ? ? ?Family History  ?Problem Relation Age of Onset  ? Heart disease Mother 84  ?     aortic valve- had AVR- died 6 wks after  ? Hyperlipidemia Mother   ? Aneurysm Father 38  ?     aortic aneursym  ? Heart disease Father   ? Heart disease Brother 13  ?     CABG  ? Colon cancer Neg Hx   ? Stomach cancer Neg Hx   ? ? ?Review of Systems: ?A 12-system review of systems was performed and was negative except as noted in the HPI. ? ?-------------------------------------------------------------------------------------------------- ? ?Physical Exam: ?BP 130/78 (BP Location: Left Arm, Patient Position: Sitting, Cuff Size: Normal)   Pulse 64   Ht '5\' 1"'$  (1.549 m)   Wt 170 lb (77.1 kg)   SpO2 98%   BMI 32.12 kg/m?  ? ?General:  NAD. ?Neck: No JVD or HJR. ?Lungs: Clear to auscultation bilaterally without wheezes or crackles. ?Heart: Regular rate and rhythm without murmurs, rubs, or gallops. ?Abdomen: Soft, nontender, nondistended. ?Extremities: No lower extremity edema. ? ?EKG: Normal sinus rhythm with isolated PVC, low voltage, and nonspecific ST/T changes.  PVCs are new from  06/08/2021.  Otherwise, no significant change. ? ?Lab Results  ?Component Value Date  ? WBC 10.5 02/23/2021  ? HGB 11.2 (L) 02/23/2021  ? HCT 34.7 (L) 02/23/2021  ? MCV 83.8 02/23/2021  ? PLT 246 02/23/2021  ? ? ?Lab Results  ?Component Value Date  ? NA 136 02/23/2021  ? K 4.0 02/23/2021  ? CL 101 02/23/2021  ? CO2 28 02/23/2021  ? BUN 19 02/23/2021  ? CREATININE 0.80 02/23/2021  ? GLUCOSE 84 02/23/2021  ? ALT 17 02/23/2021  ? ? ?Lab Results  ?Component Value Date  ? CHOL 151 02/23/2021  ? HDL 46 02/23/2021  ? [place] 77 02/23/2021  ? TRIG 139 02/23/2021  ? CHOLHDL 3.3 02/23/2021  ? ? ?-------------------------------------------------------------------------------------------------- ? ?ASSESSMENT AND PLAN: ?CAD: ?No angina reported.  Patient previously found to have coronary artery calcification but no ischemia on MPI in 2020 (despite report of remote MI).  No further work-up recommended at this time.  Continue secondary prevention with aspirin, amlodipine, and Repatha. ? ?Edema: ?Sharon Mcclure reports occasional hand and upper body edema accompanied by  mild shortness of breath.  She appears euvolemic today without any evidence of edema.  Her lungs are clear.  I encouraged her to minimize her sodium intake and to use the previously prescribed as needed furosemide when she feels swollen or notices weight gain. ? ?Questionable stroke (likely due to inadvertent intoxication): ?No new neurologic symptoms reported.  Chronic imbalance is unchanged from our last visit without any interval falls. ? ?Hyperlipidemia: ?Lipids reasonable on last check in 01/2021.  Patient is currently able to procure Repatha at a reasonable cost.  Follow-up lipid panel to be checked when Sharon Mcclure returns to see her PCP. ? ?Hypertension: ?Blood pressure upper normal today.  Continue current regimen of amlodipine. ? ?Follow-up: Return to clinic in 1 year. ? ?Nelva Bush, MD ?12/07/2021 ?11:26 AM ? ?

## 2021-12-08 ENCOUNTER — Encounter: Payer: Self-pay | Admitting: Internal Medicine

## 2021-12-08 DIAGNOSIS — Z8673 Personal history of transient ischemic attack (TIA), and cerebral infarction without residual deficits: Secondary | ICD-10-CM | POA: Insufficient documentation

## 2021-12-08 DIAGNOSIS — R609 Edema, unspecified: Secondary | ICD-10-CM | POA: Insufficient documentation

## 2021-12-08 HISTORY — DX: Edema, unspecified: R60.9

## 2021-12-21 ENCOUNTER — Ambulatory Visit (INDEPENDENT_AMBULATORY_CARE_PROVIDER_SITE_OTHER): Payer: Medicare Other | Admitting: Internal Medicine

## 2021-12-21 ENCOUNTER — Encounter: Payer: Self-pay | Admitting: Internal Medicine

## 2021-12-21 DIAGNOSIS — I10 Essential (primary) hypertension: Secondary | ICD-10-CM | POA: Diagnosis not present

## 2021-12-21 DIAGNOSIS — E559 Vitamin D deficiency, unspecified: Secondary | ICD-10-CM

## 2021-12-21 DIAGNOSIS — Z1329 Encounter for screening for other suspected endocrine disorder: Secondary | ICD-10-CM

## 2021-12-21 DIAGNOSIS — E785 Hyperlipidemia, unspecified: Secondary | ICD-10-CM | POA: Diagnosis not present

## 2021-12-21 DIAGNOSIS — R7303 Prediabetes: Secondary | ICD-10-CM

## 2021-12-21 DIAGNOSIS — Z8673 Personal history of transient ischemic attack (TIA), and cerebral infarction without residual deficits: Secondary | ICD-10-CM

## 2021-12-21 DIAGNOSIS — Z1231 Encounter for screening mammogram for malignant neoplasm of breast: Secondary | ICD-10-CM | POA: Diagnosis not present

## 2021-12-21 NOTE — Progress Notes (Signed)
telephone Note ? ?I connected with Sharon Mcclure ? on 12/21/21 at 10:40 AM EDT by telephone application and verified that I am speaking with the correct person using two identifiers. ? Location patient:  ?Location provider:work or home office ?Persons participating in the virtual visit: patient, provider ? ?I discussed the limitations and requested verbal permission for telemedicine visit. The patient expressed understanding and agreed to proceed. ? ? ?HPI: ? ?Acute telemedicine visit for : ?Abdomen swelling and sob is better on lasix 20 mg qd prn will check labs 12/22/21 9:15 am lasix per Dr. Saunders Revel seen 12/07/21 and rec f/u in 1 year  ? ?-Pertinent past medical history: see below ?-Pertinent medication allergies: ?Allergies  ?Allergen Reactions  ? Ampicillin Hives, Shortness Of Breath and Swelling  ?  Has patient had a PCN reaction causing immediate rash, facial/tongue/throat swelling, SOB or lightheadedness with hypotension: Yes ?Has patient had a PCN reaction causing severe rash involving mucus membranes or skin necrosis: No ?Has patient had a PCN reaction that required hospitalization No ?Has patient had a PCN reaction occurring within the last 10 years: No ?If all of the above answers are "NO", then may proceed with Cephalosporin use. ?  ? Penicillins Hives and Shortness Of Breath  ?  Has patient had a PCN reaction causing immediate rash, facial/tongue/throat swelling, SOB or lightheadedness with hypotension: Yes ?Has patient had a PCN reaction causing severe rash involving mucus membranes or skin necrosis: No ?Has patient had a PCN reaction that required hospitalization No ?Has patient had a PCN reaction occurring within the last 10 years: No ?If all of the above answers are "NO", then may proceed with Cephalosporin use. ?  ? Crestor [Rosuvastatin Calcium]   ?  Burning sensation  ? Ezetimibe-Simvastatin   ? Niaspan [Niacin Er]   ?  Fatigues, aches.  ? Vytorin [Ezetimibe-Simvastatin]   ? Zetia [Ezetimibe]   ?  Zocor [Simvastatin]   ? Fenofibrate   ?  Constipation and confusion  ? Welchol [Colesevelam Hcl] Other (See Comments)  ?  Severe bloating even on low dose  ? ?-COVID-19 vaccine status:  ?Immunization History  ?Administered Date(s) Administered  ? Fluad Quad(high Dose 65+) 06/02/2019  ? Influenza Whole 05/29/2007  ? Influenza, High Dose Seasonal PF 06/01/2014, 04/26/2020  ? Influenza,inj,Quad PF,6+ Mos 06/29/2015, 05/13/2018  ? Influenza-Unspecified 05/28/2013, 05/28/2021  ? PFIZER(Purple Top)SARS-COV-2 Vaccination 09/26/2019, 10/17/2019  ? Pneumococcal Conjugate-13 04/22/2014  ? Pneumococcal Polysaccharide-23 06/12/2005, 09/13/2015  ? Tdap 04/07/2019  ? Zoster Recombinat (Shingrix) 03/06/2019, 05/21/2019  ? Zoster, Live 10/08/2007  ? ? ? ?ROS: See pertinent positives and negatives per HPI. ? ?Past Medical History:  ?Diagnosis Date  ? Anxiety   ? CAD (coronary artery disease)   ? Cancer [facility])   ? basal cell-right nostril  ? COVID-19   ? 09/15/21  ? Fatty liver   ? GERD (gastroesophageal reflux disease)   ? Heart murmur   ? Hemorrhoids   ? Hiatal hernia   ? History of skin cancer   ? Right arm  ? HTN (hypertension)   ? Hyperlipidemia   ? Insomnia   ? Insomnia   ? Pneumonia 05/2015  ? Rosacea 11/24/2013  ? Vitamin D deficiency   ? ? ?Past Surgical History:  ?Procedure Laterality Date  ? APPENDECTOMY    ? BASAL CELL CARCINOMA EXCISION    ? right nostril  ? CATARACT EXTRACTION W/PHACO Left 01/25/2016  ? Procedure: CATARACT EXTRACTION PHACO AND INTRAOCULAR LENS PLACEMENT (IOC);  Surgeon: Birder Robson, MD;  Location: [facility] ORS;  Service: Ophthalmology;  Laterality: Left;  [place] 33.9 ?AP% 15.5 ?CDE 5.28 ?Fluid Pack Lot # [id number]  ? CATARACT EXTRACTION W/PHACO Right 02/22/2016  ? Procedure: CATARACT EXTRACTION PHACO AND INTRAOCULAR LENS PLACEMENT (IOC);  Surgeon: Birder Robson, MD;  Location: [facility] ORS;  Service: Ophthalmology;  Laterality: Right;  [place] 00:40 ?AP% 23.6 ?CDE 9.45 ?fluid pack lot # [id number]  ? MOHS SURGERY    ?  bcc right nasal ala 01/2020  ? TUBAL LIGATION  1981  ? ? ? ?Current Outpatient Medications:  ?  amLODipine (NORVASC) 5 MG tablet, TAKE 1 TABLET BY MOUTH ONCE A DAY, Disp: 90 tablet, Rfl: 3 ?  aspirin 81 MG tablet, Take 81 mg by mouth daily., Disp: , Rfl:  ?  Biotin 10000 MCG TABS, Take 1 tablet by mouth daily., Disp: , Rfl:  ?  buPROPion (WELLBUTRIN XL) 150 MG 24 hr tablet, TAKE 1 TABLET BY MOUTH ONCE A DAY, Disp: 90 tablet, Rfl: 3 ?  Calcium Carb-Cholecalciferol (CALCIUM+D3 PO), Take 1 tablet by mouth daily., Disp: , Rfl:  ?  citalopram (CELEXA) 20 MG tablet, TAKE 1 TABLET BY MOUTH ONCE A DAY, Disp: 90 tablet, Rfl: 3 ?  Coenzyme Q10 (CO Q 10 PO), Take 2 capsules by mouth daily., Disp: , Rfl:  ?  furosemide (LASIX) 20 MG tablet, Take 1 tablet (20 mg total) by mouth daily as needed., Disp: 30 tablet, Rfl: 5 ?  Multiple Vitamin (MULTIVITAMIN) capsule, Take 1 capsule by mouth daily., Disp: , Rfl:  ?  mupirocin ointment (BACTROBAN) 2 %, Apply 1 application topically 2 (two) times daily. Left arm prn, Disp: 30 g, Rfl: 0 ?  Nutritional Supplements (FRUIT & VEGETABLE DAILY) CAPS, Take by mouth daily., Disp: , Rfl:  ?  pantoprazole (PROTONIX) 40 MG tablet, TAKE 1 TABLET BY MOUTH TWICE A DAY BEFORE A MEAL AND BEFORE FOOD, Disp: 180 tablet, Rfl: 3 ?  REPATHA SURECLICK 527 MG/ML SOAJ, INJECT 1 PEN INTO THE SKIN EVERY 14 DAYSSCHEDULE OFFICE VISIT FOR REFILLS, Disp: 2 mL, Rfl: 2 ?  traZODone (DESYREL) 50 MG tablet, TAKE 1 TABLET BY MOUTH EVERY NIGHT AT BEDTIME FOR SLEEP, Disp: 90 tablet, Rfl: 3 ?  VITAMIN D, ERGOCALCIFEROL, PO, Take 5,000 Units by mouth daily., Disp: , Rfl:  ? ?EXAM: ? ?VITALS per patient if applicable: ? ?GENERAL: alert, oriented, appears well and in no acute distress ? ? ?MS: moves all visible extremities without noticeable abnormality ? ?PSYCH/NEURO: pleasant and cooperative, no obvious depression or anxiety, speech and thought processing grossly intact ? ?ASSESSMENT AND PLAN: ? ?Discussed the following  assessment and plan: ? ?Essential hypertension - Plan: Comprehensive metabolic panel, Lipid panel, CBC with Differential/Platelet ?On norvasc 5 mg qd  ?Lasix 30 mg qd  ? ?H/o stroke/Hyperlipidemia LDL goal <70 - Plan: Lipid panel ?On repatha  ? ?Prediabetes - Plan: Hemoglobin A1c ? ?HM ?Flu shot utd ?prevnar utd ?pna had in 2017  ?2/2shingrix ?Tdap 04/07/19 ?covid 19 2/2 consider booster [facility] ?  ?Mammogram neg 01/12/2021 ordered  ?Out of age window pap 04/22/14 negative ?DEXA 06/10/18 osteopenia ordered DEXA same 01/12/21  ?Colonoscopy In 2007 hemorrhoids had FIT test negative 07/06/17 ?due at f/u 06/2020 or after resch 11/24/20 Dr. Vicente Males had and no further colonoscopies ?  ?Skin Dr. 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  ?rec healthy diet and exercise ?-we discussed possible serious and likely etiologies, options for evaluation and workup, limitations of telemedicine visit vs in person visit, treatment, treatment risks and precautions. Pt is agreeable to treatment via telemedicine at this moment.  ? ?  ?I discussed the assessment and treatment plan with the patient. The patient was provided an opportunity to ask questions and all were answered. The patient agreed with the plan and demonstrated an understanding of the instructions. ?  ? ?Time spent 20 minutes ?Nino Glow McLean-Scocuzza, MD  ? ?

## 2021-12-22 ENCOUNTER — Other Ambulatory Visit (INDEPENDENT_AMBULATORY_CARE_PROVIDER_SITE_OTHER): Payer: Medicare Other

## 2021-12-22 DIAGNOSIS — Z1329 Encounter for screening for other suspected endocrine disorder: Secondary | ICD-10-CM

## 2021-12-22 DIAGNOSIS — R7303 Prediabetes: Secondary | ICD-10-CM | POA: Diagnosis not present

## 2021-12-22 DIAGNOSIS — I10 Essential (primary) hypertension: Secondary | ICD-10-CM | POA: Diagnosis not present

## 2021-12-22 DIAGNOSIS — E785 Hyperlipidemia, unspecified: Secondary | ICD-10-CM

## 2021-12-22 DIAGNOSIS — E559 Vitamin D deficiency, unspecified: Secondary | ICD-10-CM

## 2021-12-22 LAB — LIPID PANEL
Cholesterol: 181 mg/dL (ref 0–200)
HDL: 59.1 mg/dL (ref >39.00)
LDL Cholesterol: 97 mg/dL (ref 0–99)
NonHDL: 121.69
Total CHOL/HDL Ratio: 3
Triglycerides: 122 mg/dL (ref 0.0–149.0)
VLDL: 24.4 mg/dL (ref 0.0–40.0)

## 2021-12-22 LAB — CBC WITH DIFFERENTIAL/PLATELET
Basophils Absolute: 0.1 10*3/uL (ref 0.0–0.1)
Basophils Relative: 1 % (ref 0.0–3.0)
Eosinophils Absolute: 0.2 10*3/uL (ref 0.0–0.7)
Eosinophils Relative: 3.2 % (ref 0.0–5.0)
HCT: 38.2 % (ref 36.0–46.0)
Hemoglobin: 12.5 g/dL (ref 12.0–15.0)
Lymphocytes Relative: 29.9 % (ref 12.0–46.0)
Lymphs Abs: 1.8 10*3/uL (ref 0.7–4.0)
MCHC: 32.8 g/dL (ref 30.0–36.0)
MCV: 84.9 fl (ref 78.0–100.0)
Monocytes Absolute: 0.6 10*3/uL (ref 0.1–1.0)
Monocytes Relative: 9.7 % (ref 3.0–12.0)
Neutro Abs: 3.4 10*3/uL (ref 1.4–7.7)
Neutrophils Relative %: 56.2 % (ref 43.0–77.0)
Platelets: 240 10*3/uL (ref 150.0–400.0)
RBC: 4.5 Mil/uL (ref 3.87–5.11)
RDW: 15.6 % — ABNORMAL HIGH (ref 11.5–15.5)
WBC: 6 10*3/uL (ref 4.0–10.5)

## 2021-12-22 LAB — COMPREHENSIVE METABOLIC PANEL
ALT: 17 U/L (ref 0–35)
AST: 20 U/L (ref 0–37)
Albumin: 4.1 g/dL (ref 3.5–5.2)
Alkaline Phosphatase: 47 U/L (ref 39–117)
BUN: 22 mg/dL (ref 6–23)
CO2: 30 mEq/L (ref 19–32)
Calcium: 9.1 mg/dL (ref 8.4–10.5)
Chloride: 103 mEq/L (ref 96–112)
Creatinine, Ser: 0.88 mg/dL (ref 0.40–1.20)
GFR: 62.57 mL/min (ref >60.00)
Glucose, Bld: 103 mg/dL — ABNORMAL HIGH (ref 70–99)
Potassium: 3.8 mEq/L (ref 3.5–5.1)
Sodium: 142 mEq/L (ref 135–145)
Total Bilirubin: 0.5 mg/dL (ref 0.2–1.2)
Total Protein: 6.4 g/dL (ref 6.0–8.3)

## 2021-12-22 LAB — HEMOGLOBIN A1C: Hgb A1c MFr Bld: 5.7 % (ref 4.6–6.5)

## 2021-12-22 LAB — TSH: TSH: 2.06 u[IU]/mL (ref 0.35–5.50)

## 2021-12-22 LAB — VITAMIN D 25 HYDROXY (VIT D DEFICIENCY, FRACTURES): VITD: 47.19 ng/mL (ref 30.00–100.00)

## 2022-01-20 ENCOUNTER — Other Ambulatory Visit: Payer: Self-pay

## 2022-01-20 MED ORDER — REPATHA SURECLICK 140 MG/ML ~~LOC~~ SOAJ: 140.0000 mg | SUBCUTANEOUS | 2 refills | Status: DC

## 2022-01-24 ENCOUNTER — Telehealth: Payer: Self-pay | Admitting: Internal Medicine

## 2022-01-24 NOTE — Telephone Encounter (Signed)
Patient called and said she called [facility] and they told patient that her mediation has not been released yet. Patient is unsure what that means, does Dr Sharon Mcclure need to send another prescription? Please call patient.

## 2022-01-25 ENCOUNTER — Other Ambulatory Visit: Payer: Self-pay | Admitting: Internal Medicine

## 2022-01-25 DIAGNOSIS — F419 Anxiety disorder, unspecified: Secondary | ICD-10-CM

## 2022-01-25 DIAGNOSIS — F32A Anxiety disorder, unspecified: Secondary | ICD-10-CM

## 2022-01-25 DIAGNOSIS — I10 Essential (primary) hypertension: Secondary | ICD-10-CM

## 2022-01-25 MED ORDER — BUPROPION HCL ER (XL) 150 MG PO TB24: 150.0000 mg | ORAL_TABLET | Freq: Every day | ORAL | 3 refills | Status: DC

## 2022-01-25 MED ORDER — CITALOPRAM HYDROBROMIDE 20 MG PO TABS: 20.0000 mg | ORAL_TABLET | Freq: Every day | ORAL | 3 refills | Status: DC

## 2022-01-25 MED ORDER — AMLODIPINE BESYLATE 5 MG PO TABS: 5.0000 mg | ORAL_TABLET | Freq: Every day | ORAL | 3 refills | Status: DC

## 2022-02-22 ENCOUNTER — Other Ambulatory Visit: Payer: Self-pay | Admitting: Internal Medicine

## 2022-02-22 DIAGNOSIS — K449 Diaphragmatic hernia without obstruction or gangrene: Secondary | ICD-10-CM

## 2022-02-22 DIAGNOSIS — K219 Gastro-esophageal reflux disease without esophagitis: Secondary | ICD-10-CM

## 2022-02-22 MED ORDER — PANTOPRAZOLE SODIUM 40 MG PO TBEC: DELAYED_RELEASE_TABLET | ORAL | 3 refills | Status: DC

## 2022-04-11 ENCOUNTER — Other Ambulatory Visit: Payer: Self-pay | Admitting: Family

## 2022-04-11 ENCOUNTER — Telehealth: Payer: Self-pay | Admitting: Internal Medicine

## 2022-04-11 DIAGNOSIS — R600 Localized edema: Secondary | ICD-10-CM

## 2022-04-11 DIAGNOSIS — G4700 Insomnia, unspecified: Secondary | ICD-10-CM

## 2022-04-11 DIAGNOSIS — F419 Anxiety disorder, unspecified: Secondary | ICD-10-CM

## 2022-04-11 DIAGNOSIS — T148XXA Other injury of unspecified body region, initial encounter: Secondary | ICD-10-CM

## 2022-04-11 DIAGNOSIS — K219 Gastro-esophageal reflux disease without esophagitis: Secondary | ICD-10-CM

## 2022-04-11 DIAGNOSIS — F32A Depression, unspecified: Secondary | ICD-10-CM

## 2022-04-11 DIAGNOSIS — K449 Diaphragmatic hernia without obstruction or gangrene: Secondary | ICD-10-CM

## 2022-04-11 MED ORDER — CITALOPRAM HYDROBROMIDE 20 MG PO TABS: 20.0000 mg | ORAL_TABLET | Freq: Every day | ORAL | 1 refills | Status: DC

## 2022-04-11 MED ORDER — PANTOPRAZOLE SODIUM 40 MG PO TBEC: DELAYED_RELEASE_TABLET | ORAL | 1 refills | Status: DC

## 2022-04-11 MED ORDER — MUPIROCIN 2 % EX OINT: 1.0000 | TOPICAL_OINTMENT | Freq: Two times a day (BID) | CUTANEOUS | 0 refills | Status: DC

## 2022-04-11 MED ORDER — TRAZODONE HCL 50 MG PO TABS: ORAL_TABLET | ORAL | 1 refills | Status: DC

## 2022-04-11 MED ORDER — BUPROPION HCL ER (XL) 150 MG PO TB24: 150.0000 mg | ORAL_TABLET | Freq: Every day | ORAL | 1 refills | Status: DC

## 2022-04-11 MED ORDER — FUROSEMIDE 20 MG PO TABS: 20.0000 mg | ORAL_TABLET | Freq: Every day | ORAL | 1 refills | Status: DC | PRN

## 2022-04-11 NOTE — Telephone Encounter (Signed)
Pt need a refill on mupirocin ointment sent to [place] and pt stated she is going to be getting her medication [facility] now and she stated they have been trying to reach out to [place] regarding information that they need to get the medication going

## 2022-04-16 ENCOUNTER — Other Ambulatory Visit: Payer: Self-pay | Admitting: Internal Medicine

## 2022-04-16 DIAGNOSIS — K449 Diaphragmatic hernia without obstruction or gangrene: Secondary | ICD-10-CM

## 2022-04-16 DIAGNOSIS — K219 Gastro-esophageal reflux disease without esophagitis: Secondary | ICD-10-CM

## 2022-04-16 MED ORDER — PANTOPRAZOLE SODIUM 40 MG PO TBEC: DELAYED_RELEASE_TABLET | ORAL | 1 refills | Status: DC

## 2022-05-23 ENCOUNTER — Telehealth: Payer: Self-pay | Admitting: Internal Medicine

## 2022-05-23 NOTE — Telephone Encounter (Signed)
Copied from [place] [id number]. Topic: Medicare AWV >> May 23, 2022 11:18 AM Devoria Glassing wrote: Reason for CRM: Left message for patient to schedule Annual Wellness Visit.  Please schedule with Nurse Health Advisor Denisa O'Brien-Blaney, LPN at [facility]. This appt can be telephone or office visit.  Please call [phone] ask for [facility]

## 2022-06-01 ENCOUNTER — Ambulatory Visit (INDEPENDENT_AMBULATORY_CARE_PROVIDER_SITE_OTHER): Payer: Medicare Other | Admitting: Internal Medicine

## 2022-06-01 ENCOUNTER — Encounter: Payer: Self-pay | Admitting: Internal Medicine

## 2022-06-01 VITALS — BP 130/68 | HR 73 | Temp 98.4°F | Ht 61.0 in | Wt 168.6 lb

## 2022-06-01 DIAGNOSIS — I10 Essential (primary) hypertension: Secondary | ICD-10-CM

## 2022-06-01 DIAGNOSIS — K219 Gastro-esophageal reflux disease without esophagitis: Secondary | ICD-10-CM | POA: Diagnosis not present

## 2022-06-01 DIAGNOSIS — G4700 Insomnia, unspecified: Secondary | ICD-10-CM

## 2022-06-01 DIAGNOSIS — Z23 Encounter for immunization: Secondary | ICD-10-CM

## 2022-06-01 DIAGNOSIS — F419 Anxiety disorder, unspecified: Secondary | ICD-10-CM

## 2022-06-01 DIAGNOSIS — F32A Depression, unspecified: Secondary | ICD-10-CM | POA: Diagnosis not present

## 2022-06-01 DIAGNOSIS — K449 Diaphragmatic hernia without obstruction or gangrene: Secondary | ICD-10-CM

## 2022-06-01 DIAGNOSIS — R600 Localized edema: Secondary | ICD-10-CM | POA: Diagnosis not present

## 2022-06-01 DIAGNOSIS — L719 Rosacea, unspecified: Secondary | ICD-10-CM | POA: Diagnosis not present

## 2022-06-01 MED ORDER — CITALOPRAM HYDROBROMIDE 20 MG PO TABS: 20.0000 mg | ORAL_TABLET | Freq: Every day | ORAL | 3 refills | Status: DC

## 2022-06-01 MED ORDER — AMLODIPINE BESYLATE 5 MG PO TABS: 5.0000 mg | ORAL_TABLET | Freq: Every day | ORAL | 3 refills | Status: DC

## 2022-06-01 MED ORDER — BUPROPION HCL ER (XL) 150 MG PO TB24: 150.0000 mg | ORAL_TABLET | Freq: Every day | ORAL | 3 refills | Status: DC

## 2022-06-01 MED ORDER — DOXYCYCLINE HYCLATE 20 MG PO TABS: 20.0000 mg | ORAL_TABLET | Freq: Two times a day (BID) | ORAL | 3 refills | Status: DC

## 2022-06-01 MED ORDER — PANTOPRAZOLE SODIUM 40 MG PO TBEC: DELAYED_RELEASE_TABLET | ORAL | 1 refills | Status: DC

## 2022-06-01 MED ORDER — TRAZODONE HCL 50 MG PO TABS: ORAL_TABLET | ORAL | 3 refills | Status: DC

## 2022-06-01 MED ORDER — PREVNAR 20 0.5 ML IM SUSY: 0.5000 mL | PREFILLED_SYRINGE | INTRAMUSCULAR | 0 refills | Status: AC

## 2022-06-01 MED ORDER — FUROSEMIDE 20 MG PO TABS: 20.0000 mg | ORAL_TABLET | Freq: Every day | ORAL | 3 refills | Status: DC | PRN

## 2022-06-01 NOTE — Progress Notes (Signed)
Chief Complaint  Patient presents with   Follow-up    Follow up   F/u  1. Htn controlled on norvasc 5 mg qd lasix 20 mg qd  2. Anxiety/depression stable on celexa 20 mg qd trazadone 50 mg qd wellbutrin 150 mg qd    Review of Systems  Constitutional:  Negative for weight loss.  HENT:  Negative for hearing loss.   Eyes:  Negative for blurred vision.  Respiratory:  Negative for shortness of breath.   Cardiovascular:  Negative for chest pain.  Gastrointestinal:  Negative for abdominal pain and blood in stool.  Genitourinary:  Negative for dysuria.  Musculoskeletal:  Negative for falls and joint pain.  Skin:  Negative for rash.  Neurological:  Negative for headaches.  Psychiatric/Behavioral:  Negative for depression.    Past Medical History:  Diagnosis Date   Anxiety    CAD (coronary artery disease)    Cancer ([place])    basal cell-right nostril   COVID-19    09/15/21   Fatty liver    GERD (gastroesophageal reflux disease)    Heart murmur    Hemorrhoids    Hiatal hernia    History of skin cancer    Right arm   HTN (hypertension)    Hyperlipidemia    Insomnia    Insomnia    Pneumonia 05/2015   Rosacea 11/24/2013   Vitamin D deficiency    Past Surgical History:  Procedure Laterality Date   APPENDECTOMY     BASAL CELL CARCINOMA EXCISION     right nostril   CATARACT EXTRACTION W/PHACO Left 01/25/2016   Procedure: CATARACT EXTRACTION PHACO AND INTRAOCULAR LENS PLACEMENT ([place]);  Surgeon: Birder Robson, MD;  Location: [facility] ORS;  Service: Ophthalmology;  Laterality: Left;  [place] 33.9 AP% 15.5 CDE 5.28 Fluid Pack Lot # [id number]   CATARACT EXTRACTION W/PHACO Right 02/22/2016   Procedure: CATARACT EXTRACTION PHACO AND INTRAOCULAR LENS PLACEMENT (IOC);  Surgeon: Birder Robson, MD;  Location: [facility] ORS;  Service: Ophthalmology;  Laterality: Right;  [place] 00:40 AP% 23.6 CDE 9.45 fluid pack lot # [id number]   MOHS SURGERY     bcc right nasal ala 01/2020   TUBAL LIGATION  1981    Family History  Problem Relation Age of Onset   Heart disease Mother 23       aortic valve- had AVR- died 51 wks after   Hyperlipidemia Mother    Aneurysm Father 70       aortic aneursym   Heart disease Father    Heart disease Brother 2       CABG   Colon cancer Neg Hx    Stomach cancer Neg Hx    Social History   Socioeconomic History   Marital status: Single    Spouse name: Not on file   Number of children: Not on file   Years of education: Not on file   Highest education level: Not on file  Occupational History   Not on file  Tobacco Use   Smoking status: Former    Packs/day: 0.25    Years: 22.00    Total pack years: 5.50    Types: Cigarettes    Quit date: 1980    Years since quitting: 43.7   Smokeless tobacco: Never  Vaping Use   Vaping Use: Never used  Substance and Sexual Activity   Alcohol use: No   Drug use: No   Sexual activity: Not on file  Other Topics Concern   Not on file  Social  History Narrative   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    1 daughter    [place] up on a farm    Social Determinants of [place] Strain: [place]  (05/18/2021)   Overall Financial Resource Strain (CARDIA)    Difficulty of Paying Living Expenses: Not hard at all  Food Insecurity: No Food Insecurity (05/18/2021)   Hunger Vital Sign    Worried About Running Out of Food in the Last Year: Never true    Ran Out of Food in the Last Year: Never true  Transportation Needs: No Transportation Needs (05/18/2021)   PRAPARE - Hydrologist (Medical): No    Lack of Transportation (Non-Medical): No  Physical Activity: Insufficiently Active (05/18/2021)   Exercise Vital Sign    Days of Exercise per Week: 2 days    Minutes of Exercise per Session: 20 min  Stress: No Stress Concern Present (05/18/2021)   [place]    Feeling of Stress : Not at all  Social Connections: Unknown  (05/18/2021)   Social Connection and Isolation Panel [NHANES]    Frequency of Communication with Friends and Family: More than three times a week    Frequency of Social Gatherings with Friends and Family: Not on file    Attends Religious Services: Not on file    Active Member of Clubs or Organizations: Not on file    Attends Archivist Meetings: Not on file    Marital Status: Not on file  Intimate Partner Violence: Not At Risk (05/18/2021)   Humiliation, Afraid, Rape, and Kick questionnaire    Fear of Current or Ex-Partner: No    Emotionally Abused: No    Physically Abused: No    Sexually Abused: No   Current Meds  Medication Sig   aspirin 81 MG tablet Take 81 mg by mouth daily.   Biotin 10000 MCG TABS Take 1 tablet by mouth daily.   Calcium Carb-Cholecalciferol (CALCIUM+D3 PO) Take 1 tablet by mouth daily.   Coenzyme Q10 (CO Q 10 PO) Take 2 capsules by mouth daily.   doxycycline (PERIOSTAT) 20 MG tablet Take 1 tablet (20 mg total) by mouth 2 (two) times daily.   Evolocumab (REPATHA SURECLICK) 342 MG/ML SOAJ Inject 140 mg as directed every 14 (fourteen) days.   Multiple Vitamin (MULTIVITAMIN) capsule Take 1 capsule by mouth daily.   mupirocin ointment (BACTROBAN) 2 % Apply 1 Application topically 2 (two) times daily. Left arm prn   Nutritional Supplements (FRUIT & VEGETABLE DAILY) CAPS Take by mouth daily.   pneumococcal 20-valent conjugate vaccine (PREVNAR 20) 0.5 ML injection Inject 0.5 mLs into the muscle tomorrow at 10 am for 1 dose.   VITAMIN D, ERGOCALCIFEROL, PO Take 5,000 Units by mouth daily.   [DISCONTINUED] amLODipine (NORVASC) 5 MG tablet Take 1 tablet (5 mg total) by mouth daily.   [DISCONTINUED] buPROPion (WELLBUTRIN XL) 150 MG 24 hr tablet Take 1 tablet (150 mg total) by mouth daily.   [DISCONTINUED] citalopram (CELEXA) 20 MG tablet Take 1 tablet (20 mg total) by mouth daily.   [DISCONTINUED] furosemide (LASIX) 20 MG tablet Take 1 tablet (20 mg total) by mouth  daily as needed.   [DISCONTINUED] pantoprazole (PROTONIX) 40 MG tablet Try to take 1x per day 30 minutes before food if needed you may take 2x per day 30 minutes before food   [DISCONTINUED] traZODone (DESYREL) 50 MG tablet TAKE 1 TABLET BY MOUTH  EVERY NIGHT AT BEDTIME FOR SLEEP   Allergies  Allergen Reactions   Ampicillin Hives, Shortness Of Breath and Swelling    Has patient had a PCN reaction causing immediate rash, facial/tongue/throat swelling, SOB or lightheadedness with hypotension: Yes Has patient had a PCN reaction causing severe rash involving mucus membranes or skin necrosis: No Has patient had a PCN reaction that required hospitalization No Has patient had a PCN reaction occurring within the last 10 years: No If all of the above answers are "NO", then may proceed with Cephalosporin use.    Penicillins Hives and Shortness Of Breath    Has patient had a PCN reaction causing immediate rash, facial/tongue/throat swelling, SOB or lightheadedness with hypotension: Yes Has patient had a PCN reaction causing severe rash involving mucus membranes or skin necrosis: No Has patient had a PCN reaction that required hospitalization No Has patient had a PCN reaction occurring within the last 10 years: No If all of the above answers are "NO", then may proceed with Cephalosporin use.    Crestor [Rosuvastatin Calcium]     Burning sensation   Ezetimibe-Simvastatin    Niaspan [Niacin Er]     Fatigues, aches.   Vytorin [Ezetimibe-Simvastatin]    Zetia [Ezetimibe]    Zocor [Simvastatin]    Fenofibrate     Constipation and confusion   Welchol [Colesevelam Hcl] Other (See Comments)    Severe bloating even on low dose   No results found for this or any previous visit (from the past 2160 hour(s)). Objective  Body mass index is 31.86 kg/m. Wt Readings from Last 3 Encounters:  06/01/22 168 lb 9.6 oz (76.5 kg)  12/07/21 170 lb (77.1 kg)  09/21/21 165 lb (74.8 kg)   Temp Readings from Last 3  Encounters:  06/01/22 98.4 F (36.9 C) (Oral)  03/16/21 98 F (36.7 C) (Oral)  02/23/21 99.3 F (37.4 C) (Oral)   BP Readings from Last 3 Encounters:  06/01/22 130/68  12/07/21 130/78  06/08/21 140/78   Pulse Readings from Last 3 Encounters:  06/01/22 73  12/07/21 64  06/08/21 62    Physical Exam Vitals and nursing note reviewed.  Constitutional:      Appearance: Normal appearance. She is well-developed and well-groomed.  HENT:     Head: Normocephalic and atraumatic.  Eyes:     Conjunctiva/sclera: Conjunctivae normal.     Pupils: Pupils are equal, round, and reactive to light.  Cardiovascular:     Rate and Rhythm: Normal rate and regular rhythm.     Heart sounds: Normal heart sounds. No murmur heard. Pulmonary:     Effort: Pulmonary effort is normal.     Breath sounds: Normal breath sounds.  Abdominal:     General: Abdomen is flat. Bowel sounds are normal.     Tenderness: There is no abdominal tenderness.  Musculoskeletal:        General: No tenderness.  Skin:    General: Skin is warm and dry.  Neurological:     General: No focal deficit present.     Mental Status: She is alert and oriented to person, place, and time. Mental status is at baseline.     Cranial Nerves: Cranial nerves 2-12 are intact.     Motor: Motor function is intact.     Coordination: Coordination is intact.     Gait: Gait is intact.  Psychiatric:        Attention and Perception: Attention and perception normal.        Mood  and Affect: Mood and affect normal.        Speech: Speech normal.        Behavior: Behavior normal. Behavior is cooperative.        Thought Content: Thought content normal.        Cognition and Memory: Cognition and memory normal.        Judgment: Judgment normal.     Assessment  Plan  Essential hypertension - Plan: amLODipine (NORVASC) 5 MG tablet Lasix 20 mg qd     Rosacea - Plan: doxycycline (PERIOSTAT) 20 MG tablet bid has metrogel at home    Leg edema -  Plan: furosemide (LASIX) 20 MG tablet  Anxiety and depression - Plan: citalopram (CELEXA) 20 MG tablet, buPROPion (WELLBUTRIN XL) 150 MG 24 hr tablet  Anxiety - Plan: citalopram (CELEXA) 20 MG tablet  Insomnia, unspecified type - Plan: traZODone (DESYREL) 50 MG tablet  Gastroesophageal reflux disease - Plan: pantoprazole (PROTONIX) 40 MG tablet  Hiatal hernia - Plan: pantoprazole (PROTONIX) 40 MG tablet   HM Flu shot given today  prevnar utd pna had in 2017  Rx prevnar 20  Consider rsv vaccine  2/2shingrix Tdap 04/07/19 covid 19 2/2 consider booster [facility]   Mammogram neg 01/12/2021 ordered sch 06/2022   Out of age window pap 04/22/14 negative DEXA 06/10/18 osteopenia ordered DEXA same 01/12/21  Colonoscopy In 2007 hemorrhoids had FIT test negative 07/06/17 due at f/u 06/2020 or after resch 11/24/20 Dr. Vicente Males had and no further colonoscopies   Skin Dr. 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 Olivia Mackie McLean-Scocuzza-Internal Medicine

## 2022-06-01 NOTE — Patient Instructions (Addendum)
Can add Tums or pepcid if needed for heart burn they are over the counter   Can use metronidazole gel  +  Periostat 20 mg 2x per day with food   Phone Fax E-mail Address  [phone] Not available [address]   [place] [address]     Specialties     Dermatology       Rosacea Rosacea is a long-term (chronic) condition that affects the skin of the face, including the cheeks, nose, forehead, and chin. This condition can also affect the eyes. Rosacea causes blood vessels near the surface of the skin to enlarge, which results in redness. What are the causes? The cause of this condition is not known. Certain triggers can make rosacea worse, including: Exercise. Sunlight. Very hot or cold temperatures. Hot or spicy foods and drinks. Drinking alcohol. Stress. Taking blood pressure medicine. Long-term use of topical steroids on the face. What increases the risk? You are more likely to develop this condition if you: Are older than 79 years of age. Are a woman. Have light-colored skin (light complexion). Have a family history of rosacea. What are the signs or symptoms? Symptoms of this condition include: Redness of the face. Red bumps or pimples on the face. A red, enlarged nose. Blushing easily. Red lines on the skin. Eye problems such as: Irritated, burning, or itchy feeling in the eyes. Swollen eyelids. Drainage from the eyes. Feeling like there is something in your eye. How is this diagnosed? This condition is diagnosed with a medical history and physical exam. How is this treated? There is no cure for this condition, but treatment can help to control your symptoms. Your health care provider may recommend that you see a skin specialist (dermatologist). Treatment may include: Medicines that are applied to the skin or taken by mouth (orally). This can include antibiotic medicines. Laser treatment to improve the appearance of the skin. Surgery. This is  rare. Your health care provider will also recommend the best way to take care of your skin. Even after your skin improves, you will likely need to continue treatment to prevent your rosacea from coming back. Follow these instructions at home: Skin care Take care of your skin as told by your health care provider. You may be told to do these things: Wash your skin gently two or more times each day. Use mild soap. Use a sunscreen or sunblock with SPF 30 or greater. Use gentle cosmetics that are meant for sensitive skin. Shave with an electric shaver instead of a blade. Lifestyle Try to keep track of what foods trigger this condition. Avoid any triggers. These may include: Spicy foods. Seafood. Cheese. Hot liquids. Nuts. Chocolate. Iodized salt. Do not drink alcohol. Avoid extremely cold or hot temperatures. Try to reduce your stress. If you need help, talk with your health care provider. When you exercise, do these things to stay cool: Limit sun exposure to your face. Use a fan. Do shorter and more frequent intervals of exercise. General instructions Take and apply over-the-counter and prescription medicines only as told by your health care provider. If you were prescribed antibiotics, apply it or take them as told by your health care provider. Do not stop using the antibiotic even if your condition improves. If your eyelids are affected, apply warm compresses to them. Do this as told by your health care provider. Keep all follow-up visits. Contact a health care provider if: Your symptoms get worse. Your symptoms do not improve after 2 months  of treatment. You have new symptoms. You have any changes in vision or you have problems with your eyes, such as redness or itching. You feel depressed. You lose your appetite. You have trouble concentrating. Summary Rosacea is a long-term (chronic) condition that affects the skin of the face, including the cheeks, nose, forehead, and  chin. Take care of your skin as told by your health care provider. Take and apply over-the-counter and prescription medicines only as told by your health care provider. Contact a health care provider if your symptoms get worse or if you have any changes in vision or other problems with your eyes, such as redness or itching. Keep all follow-up visits. This information is not intended to replace advice given to you by your health care provider. Make sure you discuss any questions you have with your health care provider. Document Revised: 10/05/2021 Document Reviewed: 10/05/2021 Elsevier Patient Education  [place] for Gastroesophageal Reflux Disease, Adult When you have gastroesophageal reflux disease (GERD), the foods you eat and your eating habits are very important. Choosing the right foods can help ease the discomfort of GERD. Consider working with a dietitian to help you make healthy food choices. What are tips for following this plan? Reading food labels Look for foods that are low in saturated fat. Foods that have less than 5% of daily value (DV) of fat and 0 g of trans fats may help with your symptoms. Cooking Cook foods using methods other than frying. This may include baking, steaming, grilling, or broiling. These are all methods that do not need a lot of fat for cooking. To add flavor, try to use herbs that are low in spice and acidity. Meal planning  Choose healthy foods that are low in fat, such as fruits, vegetables, whole grains, low-fat dairy products, lean meats, fish, and poultry. Eat frequent, small meals instead of three large meals each day. Eat your meals slowly, in a relaxed setting. Avoid bending over or lying down until 2-3 hours after eating. Limit high-fat foods such as fatty meats or fried foods. Limit your intake of fatty foods, such as oils, butter, and shortening. Avoid the following as told by your health care provider: Foods that cause  symptoms. These may be different for different people. Keep a food diary to keep track of foods that cause symptoms. Alcohol. Drinking large amounts of liquid with meals. Eating meals during the 2-3 hours before bed. Lifestyle Maintain a healthy weight. Ask your health care provider what weight is healthy for you. If you need to lose weight, work with your health care provider to do so safely. Exercise for at least 30 minutes on 5 or more days each week, or as told by your health care provider. Avoid wearing clothes that fit tightly around your waist and chest. Do not use any products that contain nicotine or tobacco. These products include cigarettes, chewing tobacco, and vaping devices, such as e-cigarettes. If you need help quitting, ask your health care provider. Sleep with the head of your bed raised. Use a wedge under the mattress or blocks under the bed frame to raise the head of the bed. Chew sugar-free gum after mealtimes. What foods should I eat?  Eat a healthy, well-balanced diet of fruits, vegetables, whole grains, low-fat dairy products, lean meats, fish, and poultry. Each person is different. Foods that may trigger symptoms in one person may not trigger any symptoms in another person. Work with your health care provider to  identify foods that are safe for you. The items listed above may not be a complete list of recommended foods and beverages. Contact a dietitian for more information. What foods should I avoid? Limiting some of these foods may help manage the symptoms of GERD. Everyone is different. Consult a dietitian or your health care provider to help you identify the exact foods to avoid, if any. Fruits Any fruits prepared with added fat. Any fruits that cause symptoms. For some people this may include citrus fruits, such as oranges, grapefruit, pineapple, and lemons. Vegetables Deep-fried vegetables. [place] fries. Any vegetables prepared with added fat. Any vegetables that  cause symptoms. For some people, this may include tomatoes and tomato products, chili peppers, onions and garlic, and horseradish. Grains Pastries or quick breads with added fat. Meats and other proteins High-fat meats, such as fatty beef or pork, hot dogs, ribs, ham, sausage, salami, and bacon. Fried meat or protein, including fried fish and fried chicken. Nuts and nut butters, in large amounts. Dairy Whole milk and chocolate milk. Sour cream. Cream. Ice cream. Cream cheese. Milkshakes. Fats and oils Butter. Margarine. Shortening. Ghee. Beverages Coffee and tea, with or without caffeine. Carbonated beverages. Sodas. Energy drinks. Fruit juice made with acidic fruits, such as orange or grapefruit. Tomato juice. Alcoholic drinks. Sweets and desserts Chocolate and cocoa. Donuts. Seasonings and condiments Pepper. Peppermint and spearmint. Added salt. Any condiments, herbs, or seasonings that cause symptoms. For some people, this may include curry, hot sauce, or vinegar-based salad dressings. The items listed above may not be a complete list of foods and beverages to avoid. Contact a dietitian for more information. Questions to ask your health care provider Diet and lifestyle changes are usually the first steps that are taken to manage symptoms of GERD. If diet and lifestyle changes do not improve your symptoms, talk with your health care provider about taking medicines. Where to find more information International Foundation for Gastrointestinal Disorders: aboutgerd.org Summary When you have gastroesophageal reflux disease (GERD), food and lifestyle choices may be very helpful in easing the discomfort of GERD. Eat frequent, small meals instead of three large meals each day. Eat your meals slowly, in a relaxed setting. Avoid bending over or lying down until 2-3 hours after eating. Limit high-fat foods such as fatty meats or fried foods. This information is not intended to replace advice given to  you by your health care provider. Make sure you discuss any questions you have with your health care provider. Document Revised: 02/23/2020 Document Reviewed: 02/23/2020 Elsevier Patient Education  [place].  Consider prevnar 20   Pneumococcal Conjugate Vaccine (Prevnar 20) Suspension for Injection What is this medication? PNEUMOCOCCAL VACCINE (NEU mo KOK al vak SEEN) is a vaccine. It prevents pneumococcus bacterial infections. These bacteria can cause serious infections like pneumonia, meningitis, and blood infections. This vaccine will not treat an infection and will not cause infection. This vaccine is recommended for adults 18 years and older. This medicine may be used for other purposes; ask your health care provider or pharmacist if you have questions. COMMON BRAND NAME(S): Prevnar 20 What should I tell my care team before I take this medication? They need to know if you have any of these conditions: bleeding disorder fever immune system problems an unusual or allergic reaction to pneumococcal vaccine, diphtheria toxoid, other vaccines, other medicines, foods, dyes, or preservatives pregnant or trying to get pregnant breast-feeding How should I use this medication? This vaccine is injected into a muscle. It  is given by a health care provider. A copy of Vaccine Information Statements will be given before each vaccination. Be sure to read this information carefully each time. This sheet may change often. Talk to your health care provider about the use of this medicine in children. Special care may be needed. Overdosage: If you think you have taken too much of this medicine contact a poison control center or emergency room at once. NOTE: This medicine is only for you. Do not share this medicine with others. What if I miss a dose? This does not apply. This medicine is not for regular use. What may interact with this medication? medicines for cancer chemotherapy medicines that  suppress your immune function steroid medicines like prednisone or cortisone This list may not describe all possible interactions. Give your health care provider a list of all the medicines, herbs, non-prescription drugs, or dietary supplements you use. Also tell them if you smoke, drink alcohol, or use illegal drugs. Some items may interact with your medicine. What should I watch for while using this medication? Mild fever and pain should go away in 3 days or less. Report any unusual symptoms to your health care provider. What side effects may I notice from receiving this medication? Side effects that you should report to your doctor or health care professional as soon as possible: allergic reactions (skin rash, itching or hives; swelling of the face, lips, or tongue) confusion fast, irregular heartbeat fever over 102 degrees F muscle weakness seizures trouble breathing unusual bruising or bleeding Side effects that usually do not require medical attention (report to your doctor or health care professional if they continue or are bothersome): fever of 102 degrees F or less headache joint pain muscle cramps, pain pain, tender at site where injected This list may not describe all possible side effects. Call your doctor for medical advice about side effects. You may report side effects to FDA at 1-800-FDA-1088. Where should I keep my medication? This vaccine is only given by a health care provider. It will not be stored at home. NOTE: This sheet is a summary. It may not cover all possible information. If you have questions about this medicine, talk to your doctor, pharmacist, or health care provider.  2023 Elsevier/Gold Standard (2020-04-16 00:00:00)

## 2022-06-06 ENCOUNTER — Ambulatory Visit (INDEPENDENT_AMBULATORY_CARE_PROVIDER_SITE_OTHER): Payer: Medicare Other

## 2022-06-06 VITALS — Ht 61.0 in | Wt 168.0 lb

## 2022-06-06 DIAGNOSIS — Z0000 Encounter for general adult medical examination without abnormal findings: Secondary | ICD-10-CM

## 2022-06-06 NOTE — Patient Instructions (Addendum)
Sharon Mcclure , Thank you for taking time to come for your Medicare Wellness Visit. I appreciate your ongoing commitment to your health goals. Please review the following plan we discussed and let me know if I can assist you in the future.   These are the goals we discussed:  Goals       Patient Stated     I want to lose weight (pt-stated)      Less 167lb Stay active  Stay hydrated Portion control        This is a list of the screening recommended for you and due dates:  Health Maintenance  Topic Date Due   COVID-19 Vaccine (3 - Pfizer risk series) 11/14/2019   Tetanus Vaccine  04/06/2029   Pneumonia Vaccine  Completed   Flu Shot  Completed   DEXA scan (bone density measurement)  Completed   Hepatitis C Screening: USPSTF Recommendation to screen - Ages 4-79 yo.  Completed   Zoster (Shingles) Vaccine  Completed   HPV Vaccine  Aged Out    Advanced directives: End of life planning; Advance aging; Advanced directives discussed.  Copy of current HCPOA/Living Will requested.    Conditions/risks identified: none new  Next appointment: Follow up in one year for your annual wellness visit    Preventive Care 65 Years and Older, Female Preventive care refers to lifestyle choices and visits with your health care provider that can promote health and wellness. What does preventive care include? A yearly physical exam. This is also called an annual well check. Dental exams once or twice a year. Routine eye exams. Ask your health care provider how often you should have your eyes checked. Personal lifestyle choices, including: Daily care of your teeth and gums. Regular physical activity. Eating a healthy diet. Avoiding tobacco and drug use. Limiting alcohol use. Practicing safe sex. Taking low-dose aspirin every day. Taking vitamin and mineral supplements as recommended by your health care provider. What happens during an annual well check? The services and screenings done by your  health care provider during your annual well check will depend on your age, overall health, lifestyle risk factors, and family history of disease. Counseling  Your health care provider may ask you questions about your: Alcohol use. Tobacco use. Drug use. Emotional well-being. Home and relationship well-being. Sexual activity. Eating habits. History of falls. Memory and ability to understand (cognition). Work and work Statistician. Reproductive health. Screening  You may have the following tests or measurements: Height, weight, and BMI. Blood pressure. Lipid and cholesterol levels. These may be checked every 5 years, or more frequently if you are over 27 years old. Skin check. Lung cancer screening. You may have this screening every year starting at age 9 if you have a 30-pack-year history of smoking and currently smoke or have quit within the past 15 years. Fecal occult blood test (FOBT) of the stool. You may have this test every year starting at age 10. Flexible sigmoidoscopy or colonoscopy. You may have a sigmoidoscopy every 5 years or a colonoscopy every 10 years starting at age 13. Hepatitis C blood test. Hepatitis B blood test. Sexually transmitted disease (STD) testing. Diabetes screening. This is done by checking your blood sugar (glucose) after you have not eaten for a while (fasting). You may have this done every 1-3 years. Bone density scan. This is done to screen for osteoporosis. You may have this done starting at age 53. Mammogram. This may be done every 1-2 years. Talk to your health  care provider about how often you should have regular mammograms. Talk with your health care provider about your test results, treatment options, and if necessary, the need for more tests. Vaccines  Your health care provider may recommend certain vaccines, such as: Influenza vaccine. This is recommended every year. Tetanus, diphtheria, and acellular pertussis (Tdap, Td) vaccine. You may  need a Td booster every 10 years. Zoster vaccine. You may need this after age 81. Pneumococcal 13-valent conjugate (PCV13) vaccine. One dose is recommended after age 83. Pneumococcal polysaccharide (PPSV23) vaccine. One dose is recommended after age 59. Talk to your health care provider about which screenings and vaccines you need and how often you need them. This information is not intended to replace advice given to you by your health care provider. Make sure you discuss any questions you have with your health care provider. Document Released: 09/10/2015 Document Revised: 05/03/2016 Document Reviewed: 06/15/2015 Elsevier Interactive Patient Education  2017 [place] Prevention in the Home Falls can cause injuries. They can happen to people of all ages. There are many things you can do to make your home safe and to help prevent falls. What can I do on the outside of my home? Regularly fix the edges of walkways and driveways and fix any cracks. Remove anything that might make you trip as you walk through a door, such as a raised step or threshold. Trim any bushes or trees on the path to your home. Use bright outdoor lighting. Clear any walking paths of anything that might make someone trip, such as rocks or tools. Regularly check to see if handrails are loose or broken. Make sure that both sides of any steps have handrails. Any raised decks and porches should have guardrails on the edges. Have any leaves, snow, or ice cleared regularly. Use sand or salt on walking paths during winter. Clean up any spills in your garage right away. This includes oil or grease spills. What can I do in the bathroom? Use night lights. Install grab bars by the toilet and in the tub and shower. Do not use towel bars as grab bars. Use non-skid mats or decals in the tub or shower. If you need to sit down in the shower, use a plastic, non-slip stool. Keep the floor dry. Clean up any water that spills on  the floor as soon as it happens. Remove soap buildup in the tub or shower regularly. Attach bath mats securely with double-sided non-slip rug tape. Do not have throw rugs and other things on the floor that can make you trip. What can I do in the bedroom? Use night lights. Make sure that you have a light by your bed that is easy to reach. Do not use any sheets or blankets that are too big for your bed. They should not hang down onto the floor. Have a firm chair that has side arms. You can use this for support while you get dressed. Do not have throw rugs and other things on the floor that can make you trip. What can I do in the kitchen? Clean up any spills right away. Avoid walking on wet floors. Keep items that you use a lot in easy-to-reach places. If you need to reach something above you, use a strong step stool that has a grab bar. Keep electrical cords out of the way. Do not use floor polish or wax that makes floors slippery. If you must use wax, use non-skid floor wax. Do not have throw  rugs and other things on the floor that can make you trip. What can I do with my stairs? Do not leave any items on the stairs. Make sure that there are handrails on both sides of the stairs and use them. Fix handrails that are broken or loose. Make sure that handrails are as long as the stairways. Check any carpeting to make sure that it is firmly attached to the stairs. Fix any carpet that is loose or worn. Avoid having throw rugs at the top or bottom of the stairs. If you do have throw rugs, attach them to the floor with carpet tape. Make sure that you have a light switch at the top of the stairs and the bottom of the stairs. If you do not have them, ask someone to add them for you. What else can I do to help prevent falls? Wear shoes that: Do not have high heels. Have rubber bottoms. Are comfortable and fit you well. Are closed at the toe. Do not wear sandals. If you use a stepladder: Make sure  that it is fully opened. Do not climb a closed stepladder. Make sure that both sides of the stepladder are locked into place. Ask someone to hold it for you, if possible. Clearly mark and make sure that you can see: Any grab bars or handrails. First and last steps. Where the edge of each step is. Use tools that help you move around (mobility aids) if they are needed. These include: Canes. Walkers. Scooters. Crutches. Turn on the lights when you go into a dark area. Replace any light bulbs as soon as they burn out. Set up your furniture so you have a clear path. Avoid moving your furniture around. If any of your floors are uneven, fix them. If there are any pets around you, be aware of where they are. Review your medicines with your doctor. Some medicines can make you feel dizzy. This can increase your chance of falling. Ask your doctor what other things that you can do to help prevent falls. This information is not intended to replace advice given to you by your health care provider. Make sure you discuss any questions you have with your health care provider. Document Released: 06/10/2009 Document Revised: 01/20/2016 Document Reviewed: 09/18/2014 Elsevier Interactive Patient Education  2017 [employer].

## 2022-06-06 NOTE — Progress Notes (Signed)
Subjective:   Sharon Mcclure is a 81 y.o. female who presents for Medicare Annual (Subsequent) preventive examination.  Review of Systems    No ROS.  Medicare Wellness Virtual Visit.  Visual/audio telehealth visit, UTA vital signs.   See social history for additional risk factors.   Cardiac Risk Factors include: advanced age (>65mn, >>6women)     Objective:    Today's Vitals   06/06/22 1045  Weight: 168 lb (76.2 kg)  Height: '5\' 1"'$  (1.549 m)   Body mass index is 31.74 kg/m.     06/06/2022   10:53 AM 05/18/2021   10:47 AM 02/22/2021    5:45 PM 05/17/2020   10:47 AM 05/15/2019   10:43 AM 05/13/2018    9:42 AM 02/22/2016    6:11 AM  Advanced Directives  Does Patient Have a Medical Advance Directive? Yes No  Yes No No Yes  Type of AParamedicof [place] will  [place] will [place] will   [place] will  Does patient want to make changes to medical advance directive? No - Patient declined  No - Patient declined No - Patient declined     Copy of [place] Chart? No - copy requested  No - copy requested No - copy requested     Would patient like information on creating a medical advance directive?  No - Patient declined   Yes (MAU/Ambulatory/Procedural Areas - Information given) Yes (Inpatient - patient requests chaplain consult to create a medical advance directive)     Current Medications (verified) Outpatient Encounter Medications as of 06/06/2022  Medication Sig   amLODipine (NORVASC) 5 MG tablet Take 1 tablet (5 mg total) by mouth daily.   aspirin 81 MG tablet Take 81 mg by mouth daily.   Biotin 10000 MCG TABS Take 1 tablet by mouth daily.   buPROPion (WELLBUTRIN XL) 150 MG 24 hr tablet Take 1 tablet (150 mg total) by mouth daily.   Calcium Carb-Cholecalciferol (CALCIUM+D3 PO) Take 1 tablet by mouth daily.   citalopram (CELEXA) 20 MG tablet  Take 1 tablet (20 mg total) by mouth daily.   Coenzyme Q10 (CO Q 10 PO) Take 2 capsules by mouth daily.   doxycycline (PERIOSTAT) 20 MG tablet Take 1 tablet (20 mg total) by mouth 2 (two) times daily.   Evolocumab (REPATHA SURECLICK) 1250MG/ML SOAJ Inject 140 mg as directed every 14 (fourteen) days.   furosemide (LASIX) 20 MG tablet Take 1 tablet (20 mg total) by mouth daily as needed.   Multiple Vitamin (MULTIVITAMIN) capsule Take 1 capsule by mouth daily.   mupirocin ointment (BACTROBAN) 2 % Apply 1 Application topically 2 (two) times daily. Left arm prn   Nutritional Supplements (FRUIT & VEGETABLE DAILY) CAPS Take by mouth daily.   pantoprazole (PROTONIX) 40 MG tablet Try to take 1x per day 30 minutes before food if needed you may take 2x per day 30 minutes before food   traZODone (DESYREL) 50 MG tablet TAKE 1 TABLET BY MOUTH EVERY NIGHT AT BEDTIME FOR SLEEP   VITAMIN D, ERGOCALCIFEROL, PO Take 5,000 Units by mouth daily.   No facility-administered encounter medications on file as of 06/06/2022.    Allergies (verified) Ampicillin, Penicillins, Crestor [rosuvastatin calcium], Ezetimibe-simvastatin, Niaspan [niacin er], Vytorin [ezetimibe-simvastatin], Zetia [ezetimibe], Zocor [simvastatin], Fenofibrate, and Welchol [colesevelam hcl]   History: Past Medical History:  Diagnosis Date   Anxiety    CAD (coronary artery  disease)    Cancer ([place])    basal cell-right nostril   COVID-19    09/15/21   Fatty liver    GERD (gastroesophageal reflux disease)    Heart murmur    Hemorrhoids    Hiatal hernia    History of skin cancer    Right arm   HTN (hypertension)    Hyperlipidemia    Insomnia    Insomnia    Pneumonia 05/2015   Rosacea 11/24/2013   Vitamin D deficiency    Past Surgical History:  Procedure Laterality Date   APPENDECTOMY     BASAL CELL CARCINOMA EXCISION     right nostril   CATARACT EXTRACTION W/PHACO Left 01/25/2016   Procedure: CATARACT EXTRACTION PHACO AND  INTRAOCULAR LENS PLACEMENT ([place]);  Surgeon: Birder Robson, MD;  Location: [facility] ORS;  Service: Ophthalmology;  Laterality: Left;  [place] 33.9 AP% 15.5 CDE 5.28 Fluid Pack Lot # [id number]   CATARACT EXTRACTION W/PHACO Right 02/22/2016   Procedure: CATARACT EXTRACTION PHACO AND INTRAOCULAR LENS PLACEMENT ([facility]);  Surgeon: Birder Robson, MD;  Location: [facility] ORS;  Service: Ophthalmology;  Laterality: Right;  [place] 00:40 AP% 23.6 CDE 9.45 fluid pack lot # [id number]   MOHS SURGERY     bcc right nasal ala 01/2020   TUBAL LIGATION  1981   Family History  Problem Relation Age of Onset   Heart disease Mother 37       aortic valve- had AVR- died 58 wks after   Hyperlipidemia Mother    Aneurysm Father 2       aortic aneursym   Heart disease Father    Heart disease Brother 23       CABG   Colon cancer Neg Hx    Stomach cancer Neg Hx    Social History   Socioeconomic History   Marital status: Single    Spouse name: Not on file   Number of children: Not on file   Years of education: Not on file   Highest education level: Not on file  Occupational History   Not on file  Tobacco Use   Smoking status: Former    Packs/day: 0.25    Years: 22.00    Total pack years: 5.50    Types: Cigarettes    Quit date: 1980    Years since quitting: 43.8   Smokeless tobacco: Never  Vaping Use   Vaping Use: Never used  Substance and Sexual Activity   Alcohol use: No   Drug use: No   Sexual activity: Not on file  Other Topics Concern   Not on file  Social History Narrative   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    1 daughter    [place] farm    Social Determinants of Health   Financial Resource Strain: Low Risk  (05/18/2021)   Overall Financial Resource Strain (CARDIA)    Difficulty of Paying Living Expenses: Not hard at all  Food Insecurity: No Food Insecurity (06/06/2022)   Hunger Vital Sign    Worried About Running Out of Food in the Last Year: Never true    Ran Out of Food in the Last  Year: Never true  Transportation Needs: No Transportation Needs (06/06/2022)   PRAPARE - Hydrologist (Medical): No    Lack of Transportation (Non-Medical): No  Physical Activity: Insufficiently Active (06/06/2022)   Exercise Vital Sign    Days of Exercise per Week: 4 days    Minutes of Exercise  per Session: 20 min  Stress: No Stress Concern Present (06/06/2022)   [place]    Feeling of Stress : Not at all  Social Connections: Unknown (05/18/2021)   Social Connection and Isolation Panel [NHANES]    Frequency of Communication with Friends and Family: More than three times a week    Frequency of Social Gatherings with Friends and Family: Not on file    Attends Religious Services: Not on Advertising copywriter or Organizations: Not on file    Attends Archivist Meetings: Not on file    Marital Status: Not on file    Tobacco Counseling Counseling given: Not Answered   Clinical Intake:  Pre-visit preparation completed: Yes        Diabetes: No  How often do you need to have someone help you when you read instructions, pamphlets, or other written materials from your doctor or pharmacy?: 1 - Never   Interpreter Needed?: No      Activities of Daily Living    06/06/2022   10:47 AM  In your present state of health, do you have any difficulty performing the following activities:  Hearing? 0  Vision? 0  Difficulty concentrating or making decisions? 0  Walking or climbing stairs? 1  Comment Cane in use when ambulating as needed.  Dressing or bathing? 0  Doing errands, shopping? 0  Preparing Food and eating ? N  Using the Toilet? N  In the past six months, have you accidently leaked urine? Y  Comment Managed with daily pad. Followed by Urology and PCP.  Do you have problems with loss of bowel control? N  Managing your Medications? N  Managing your Finances? N   Housekeeping or managing your Housekeeping? N    Patient Care Team: McLean-Scocuzza, Nino Glow, MD as PCP - General (Internal Medicine) End, Harrell Gave, MD as PCP - Cardiology (Cardiology)  Indicate any recent Medical Services you may have received from other than [facility] providers in the past year (date may be approximate).     Assessment:   This is a routine wellness examination for [place].  I connected with  Sharon Mcclure on 06/06/22 by a audio enabled telemedicine application and verified that I am speaking with the correct person using two identifiers.  Patient Location: Home  Provider Location: Office/Clinic  I discussed the limitations of evaluation and management by telemedicine. The patient expressed understanding and agreed to proceed.   Hearing/Vision screen Hearing Screening - Comments:: Denies difficulty hearing conversational tones. No hearing aids worn.  Vision Screening - Comments:: Followed [facility] Wears corrective lenses  Cataract extraction, bilateral  Visual acuity not assessed, virtual visit.   Dietary issues and exercise activities discussed: Current Exercise Habits: Home exercise routine, Type of exercise: walking, Time (Minutes): 20, Frequency (Times/Week): 4, Weekly Exercise (Minutes/Week): 80, Intensity: Mild Healthy diet Minimal sweets Good water intake   Goals Addressed               This Visit's Progress     Patient Stated     I want to lose weight (pt-stated)   On track     Less 167lb Stay active  Stay hydrated Portion control       Depression Screen    06/06/2022   10:55 AM 06/01/2022    1:25 PM 05/18/2021   10:50 AM 05/17/2020   10:37 AM 04/07/2020    9:35 AM 12/02/2019  9:47 AM 05/30/2019    9:32 AM  PHQ 2/9 Scores  PHQ - 2 Score 0 0 0 0 0 3 0  PHQ- 9 Score    0 0 9     Fall Risk    06/06/2022   10:57 AM 06/01/2022    1:24 PM 12/21/2021   10:44 AM 05/18/2021   10:49 AM 03/16/2021    9:38 AM  Fall Risk    Falls in the past year?  1 0  1  Comment No falls since last asked one week ago.      Number falls in past yr:  0 0  1  Injury with Fall?  0 0  0  Risk for fall due to : No Fall Risks No Fall Risks No Fall Risks  History of fall(s)  Follow up Falls evaluation completed Falls evaluation completed Falls evaluation completed Falls evaluation completed Falls evaluation completed    [place]: Any stairs in or around the home? Yes If so, are there any without handrails? No  Home free of loose throw rugs in walkways, pet beds, electrical cords, etc? Yes  Adequate lighting in your home to reduce risk of falls? Yes   ASSISTIVE DEVICES UTILIZED TO PREVENT FALLS: Life alert? Yes  Use of a cane, walker or w/c? Yes  Grab bars in the bathroom? Yes  Shower chair or bench in shower? Yes  Elevated toilet seat or a handicapped toilet? Yes   TIMED UP AND GO: Was the test performed? No .    Cognitive Function:        06/06/2022   10:59 AM 05/17/2020   10:51 AM 05/15/2019   10:58 AM 05/13/2018    9:47 AM  6CIT Screen  What Year? 0 points 0 points 0 points 0 points  What month? 0 points 0 points 0 points 0 points  What time? 0 points 0 points 0 points 0 points  Count back from 20 0 points 0 points 0 points 0 points  Months in reverse 0 points 0 points 0 points 0 points  Repeat phrase 2 points 2 points 0 points 2 points  Total Score 2 points 2 points 0 points 2 points    Immunizations Immunization History  Administered Date(s) Administered   Fluad Quad(high Dose 65+) 06/02/2019   Influenza Whole 05/29/2007   Influenza, High Dose Seasonal PF 06/01/2014, 04/26/2020   Influenza,inj,Quad PF,6+ Mos 06/29/2015, 05/13/2018, 06/01/2022   Influenza-Unspecified 05/28/2013, 05/28/2021   PFIZER(Purple Top)SARS-COV-2 Vaccination 09/26/2019, 10/17/2019   Pneumococcal Conjugate-13 04/22/2014   Pneumococcal Polysaccharide-23 06/12/2005, 09/13/2015   Tdap 04/07/2019    Zoster Recombinat (Shingrix) 03/06/2019, 05/21/2019   Zoster, Live 10/08/2007   Covid-19 vaccine status: Completed vaccines x2  Screening Tests Health Maintenance  Topic Date Due   COVID-19 Vaccine (3 - Pfizer risk series) 11/14/2019   TETANUS/TDAP  04/06/2029   Pneumonia Vaccine 37+ Years old  Completed   INFLUENZA VACCINE  Completed   DEXA SCAN  Completed   Hepatitis C Screening  Completed   Zoster Vaccines- Shingrix  Completed   HPV VACCINES  Aged Out    Health Maintenance  Health Maintenance Due  Topic Date Due   COVID-19 Vaccine (3 - Pfizer risk series) 11/14/2019   Lung Cancer Screening: (Low Dose CT Chest recommended if Age 43-80 years, 30 pack-year currently smoking OR have quit w/in 15years.) does not qualify.   Hepatitis C Screening: does not qualify.  Vision Screening: Recommended annual ophthalmology exams  for early detection of glaucoma and other disorders of the eye.  Dental Screening: Recommended annual dental exams for proper oral hygiene. Visits every 6 months.   Community Resource Referral / Chronic Care Management: CRR required this visit?  No   CCM required this visit?  No      Plan:     I have personally reviewed and noted the following in the patient's chart:   Medical and social history Use of alcohol, tobacco or illicit drugs  Current medications and supplements including opioid prescriptions. Patient is not currently taking opioid prescriptions. Functional ability and status Nutritional status Physical activity Advanced directives List of other physicians Hospitalizations, surgeries, and ER visits in previous 12 months Vitals Screenings to include cognitive, depression, and falls Referrals and appointments  In addition, I have reviewed and discussed with patient certain preventive protocols, quality metrics, and best practice recommendations. A written personalized care plan for preventive services as well as general preventive  health recommendations were provided to patient.     Varney Biles, LPN   07/13/5207

## 2022-06-28 ENCOUNTER — Ambulatory Visit
Admission: RE | Admit: 2022-06-28 | Discharge: 2022-06-28 | Disposition: A | Discharge status: Home / Self Care | Payer: Medicare Other | Source: Ambulatory Visit | Attending: Internal Medicine | Admitting: Internal Medicine

## 2022-06-28 DIAGNOSIS — Z1231 Encounter for screening mammogram for malignant neoplasm of breast: Secondary | ICD-10-CM | POA: Insufficient documentation

## 2022-06-30 ENCOUNTER — Other Ambulatory Visit: Payer: Self-pay | Admitting: Family Medicine

## 2022-06-30 DIAGNOSIS — R928 Other abnormal and inconclusive findings on diagnostic imaging of breast: Secondary | ICD-10-CM

## 2022-06-30 DIAGNOSIS — N63 Unspecified lump in unspecified breast: Secondary | ICD-10-CM

## 2022-07-26 ENCOUNTER — Other Ambulatory Visit: Payer: Medicare Other

## 2022-08-03 ENCOUNTER — Other Ambulatory Visit: Payer: Self-pay | Admitting: Internal Medicine

## 2022-08-09 ENCOUNTER — Other Ambulatory Visit: Payer: Medicare Other

## 2022-08-10 ENCOUNTER — Ambulatory Visit
Admission: RE | Admit: 2022-08-10 | Discharge: 2022-08-10 | Disposition: A | Discharge status: Home / Self Care | Payer: Medicare Other | Source: Ambulatory Visit | Attending: Family Medicine | Admitting: Family Medicine

## 2022-08-10 DIAGNOSIS — N63 Unspecified lump in unspecified breast: Secondary | ICD-10-CM

## 2022-08-10 DIAGNOSIS — R928 Other abnormal and inconclusive findings on diagnostic imaging of breast: Secondary | ICD-10-CM | POA: Diagnosis not present

## 2022-08-15 ENCOUNTER — Telehealth: Payer: Self-pay

## 2022-08-15 NOTE — Telephone Encounter (Signed)
Prior authorization initiated via CoverMyMeds. Key: [id number]  Response: Prior authorization for Repatha has been approved. Request Reference Number: [id number]. REPATHA SURE INJ '140MG'$ /ML is approved through 08/28/2023. Your patient may now fill this prescription and it will be covered.

## 2022-08-25 ENCOUNTER — Other Ambulatory Visit: Payer: Self-pay

## 2022-08-25 DIAGNOSIS — K449 Diaphragmatic hernia without obstruction or gangrene: Secondary | ICD-10-CM

## 2022-08-25 DIAGNOSIS — K219 Gastro-esophageal reflux disease without esophagitis: Secondary | ICD-10-CM

## 2022-08-25 MED ORDER — PANTOPRAZOLE SODIUM 40 MG PO TBEC: DELAYED_RELEASE_TABLET | ORAL | 1 refills | Status: DC

## 2022-09-01 ENCOUNTER — Other Ambulatory Visit (HOSPITAL_COMMUNITY): Payer: Self-pay

## 2022-09-12 ENCOUNTER — Other Ambulatory Visit: Payer: Self-pay

## 2022-09-12 DIAGNOSIS — K219 Gastro-esophageal reflux disease without esophagitis: Secondary | ICD-10-CM

## 2022-09-12 DIAGNOSIS — K449 Diaphragmatic hernia without obstruction or gangrene: Secondary | ICD-10-CM

## 2022-09-12 MED ORDER — PANTOPRAZOLE SODIUM 40 MG PO TBEC: DELAYED_RELEASE_TABLET | ORAL | 1 refills | Status: DC

## 2022-10-01 ENCOUNTER — Other Ambulatory Visit: Payer: Self-pay | Admitting: Internal Medicine

## 2022-10-05 ENCOUNTER — Other Ambulatory Visit: Payer: Self-pay

## 2022-10-05 DIAGNOSIS — K219 Gastro-esophageal reflux disease without esophagitis: Secondary | ICD-10-CM

## 2022-10-05 DIAGNOSIS — K449 Diaphragmatic hernia without obstruction or gangrene: Secondary | ICD-10-CM

## 2022-10-05 MED ORDER — PANTOPRAZOLE SODIUM 40 MG PO TBEC: DELAYED_RELEASE_TABLET | ORAL | 1 refills | Status: DC

## 2022-10-10 ENCOUNTER — Encounter: Payer: Medicare Other | Admitting: Family Medicine

## 2022-10-11 IMAGING — MG MM DIGITAL SCREENING BILAT W/ TOMO AND CAD
6 of 12 series · 6 of 36 positions shown · non-contrast
Comparison: Previous exam(s).

CLINICAL DATA: Screening.

EXAM:
DIGITAL SCREENING BILATERAL MAMMOGRAM WITH TOMOSYNTHESIS AND CAD
TECHNIQUE: Bilateral screening digital craniocaudal and mediolateral oblique
mammograms were obtained. Bilateral screening digital breast
tomosynthesis was performed. The images were evaluated with
computer-aided detection.

[L MLO synth-2D (1 of 2)]
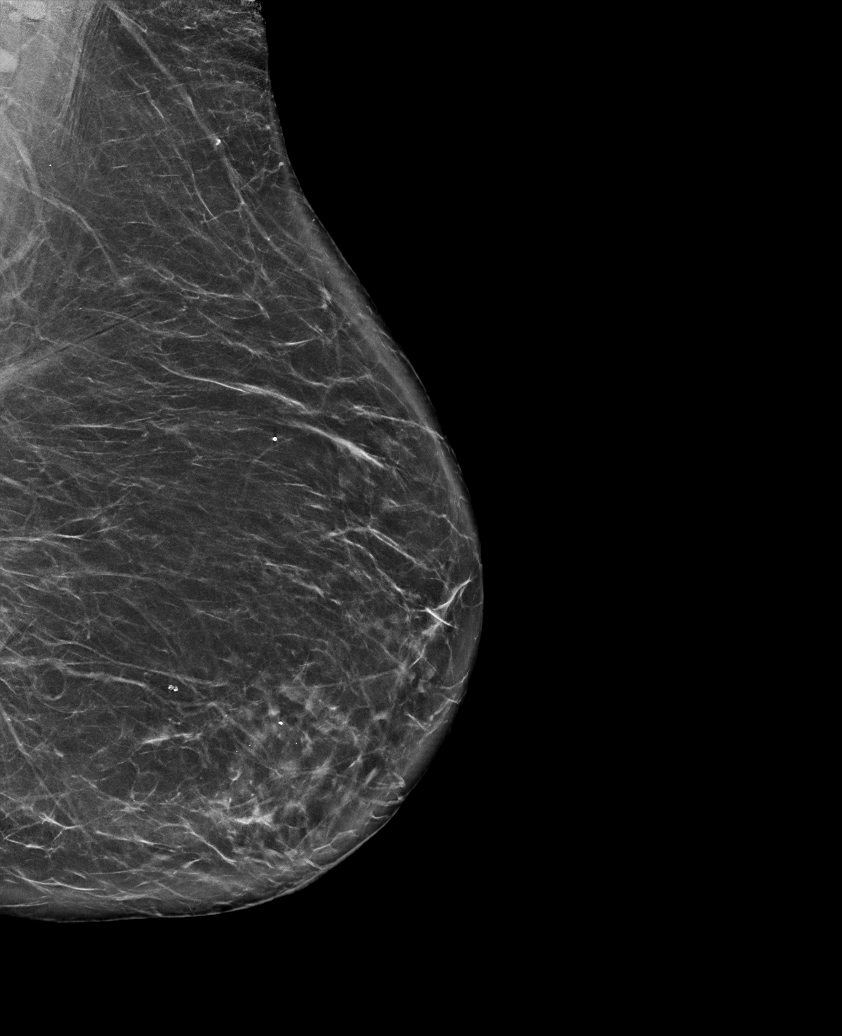

[R MLO synth-2D (1 of 2)]
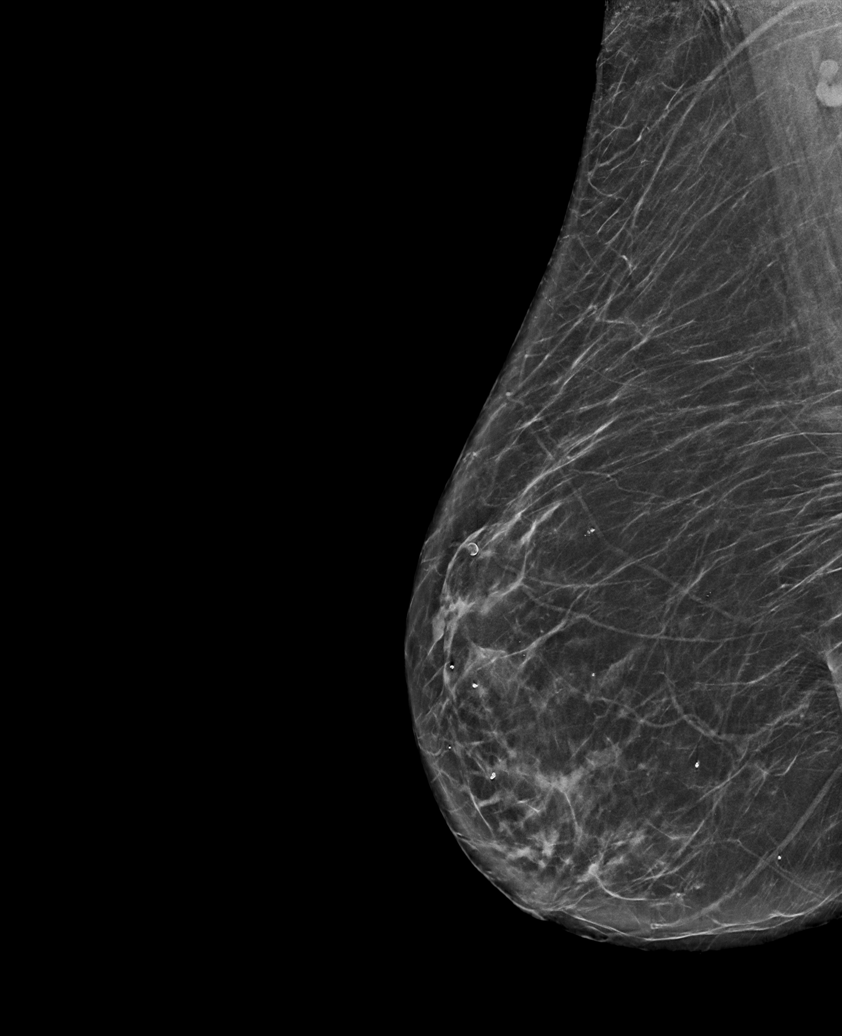

[R CC synth-2D]
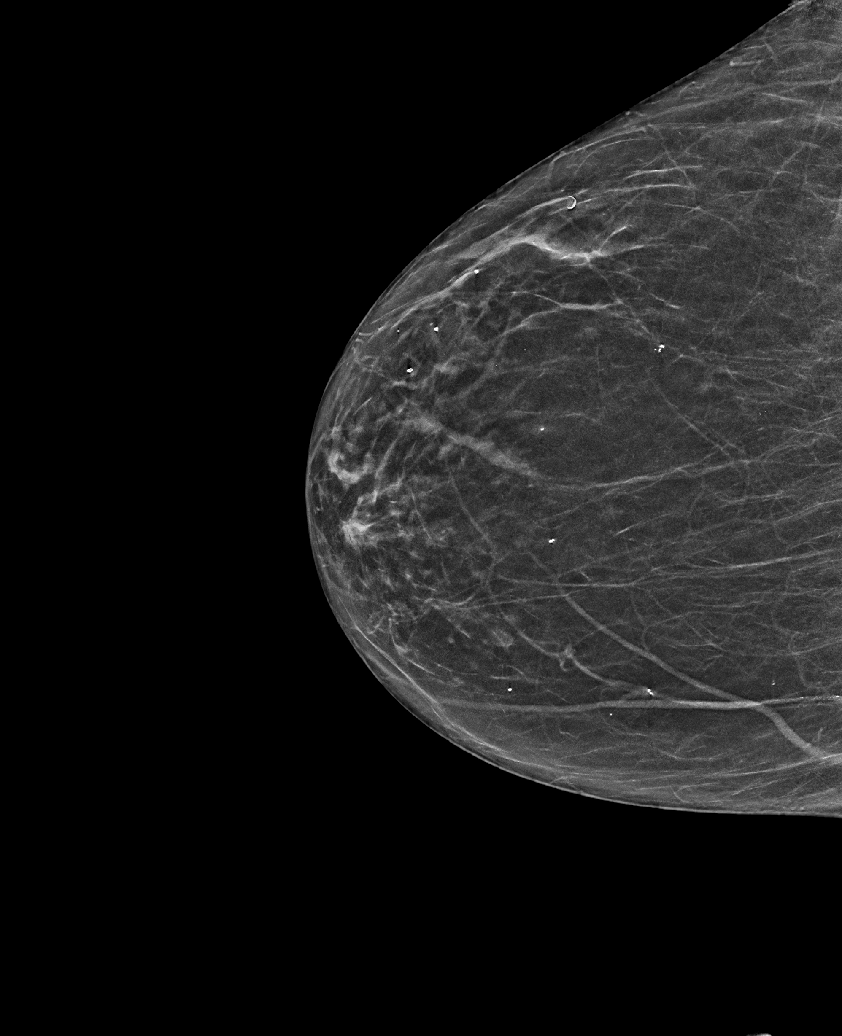

[R MLO synth-2D (2 of 2)]
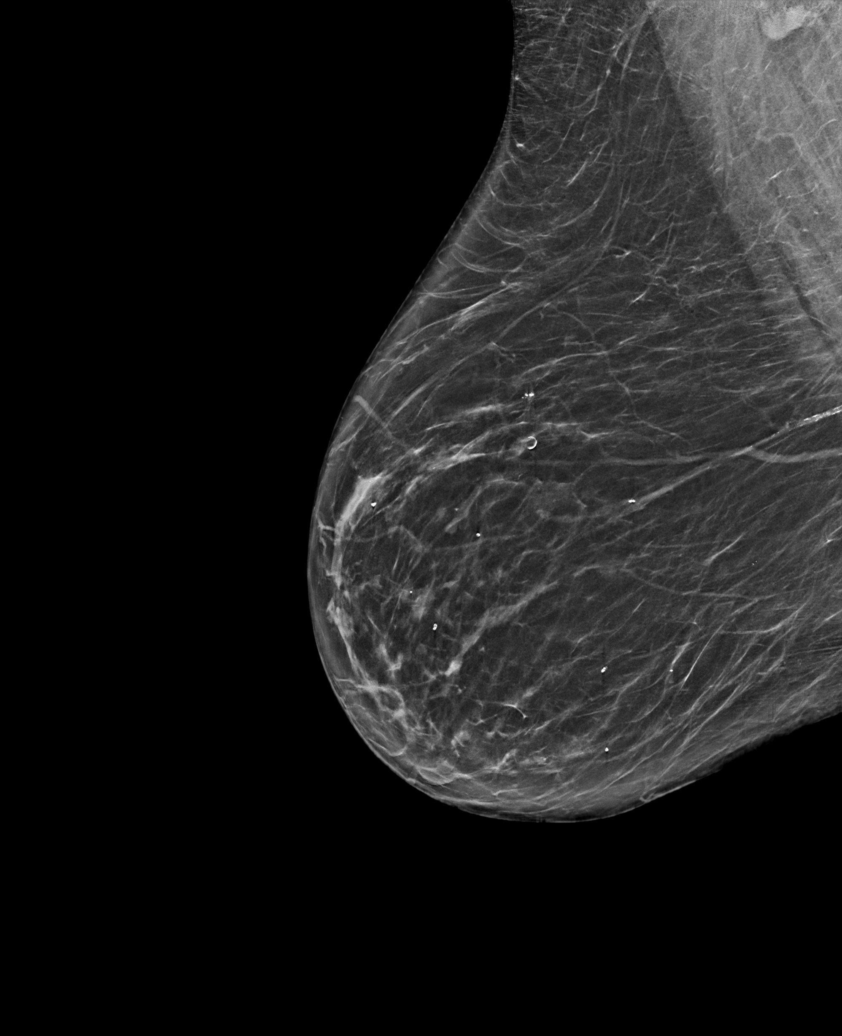

[L MLO synth-2D (2 of 2)]
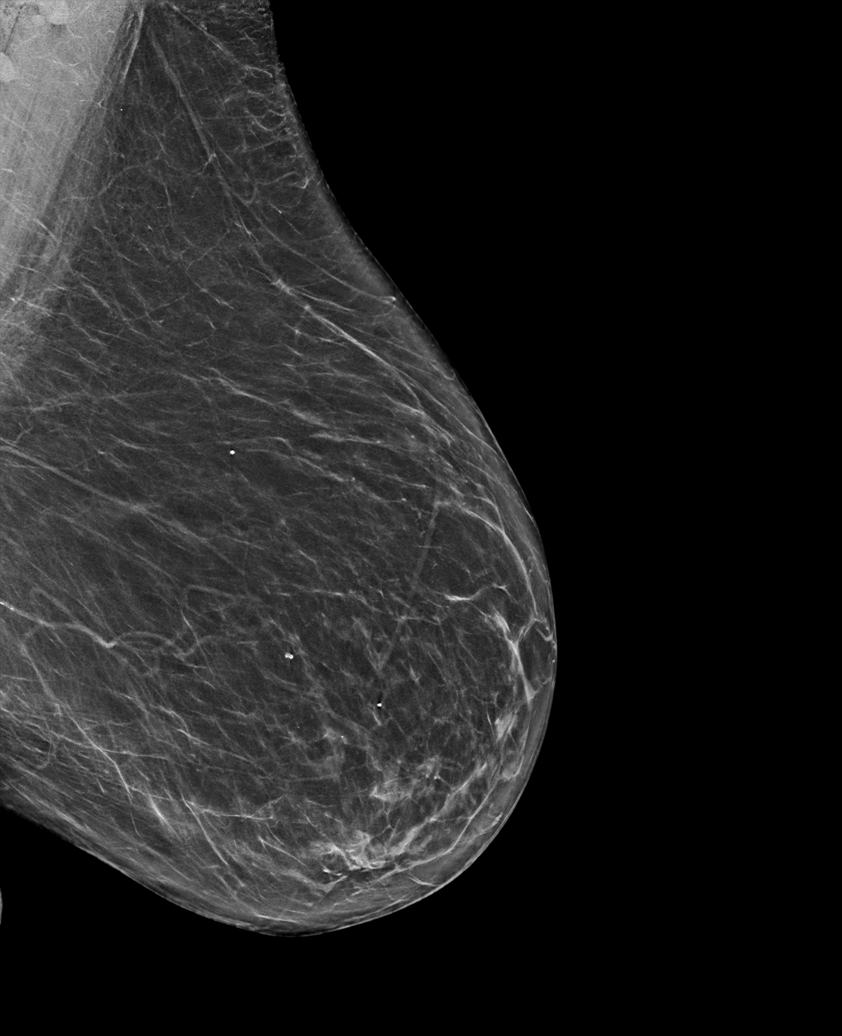

[L CC synth-2D]
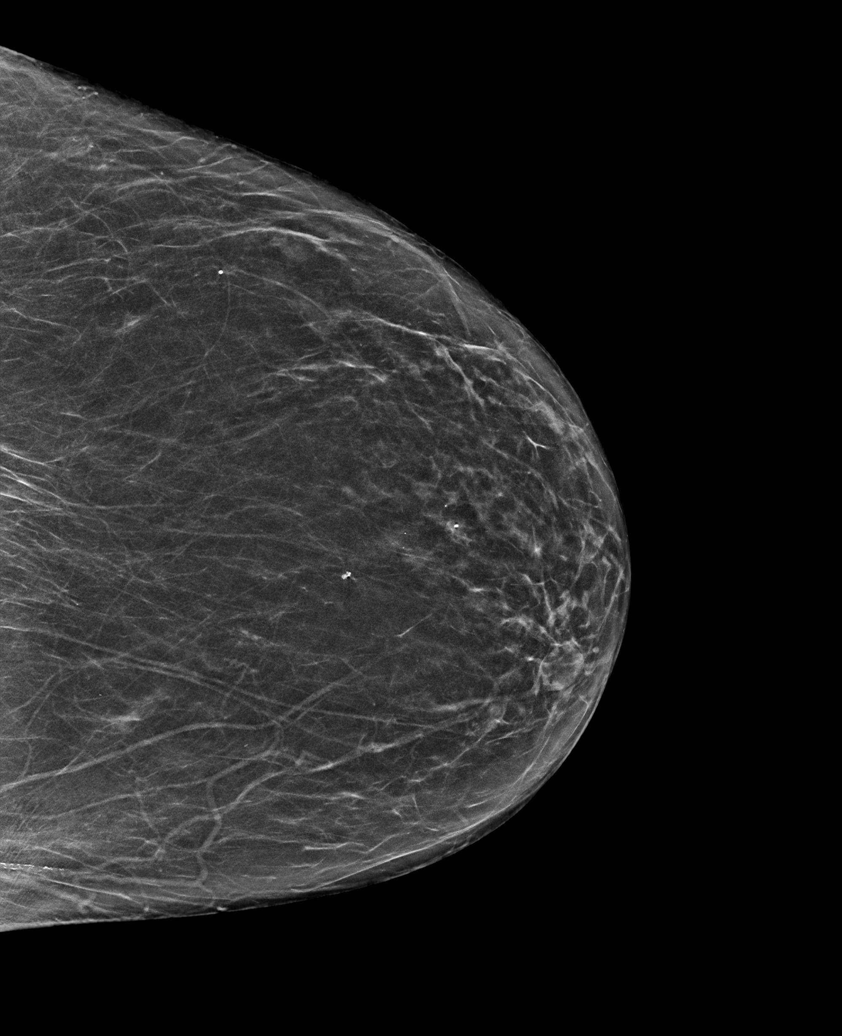

[6 of 36 positions shown; findings below may reference images not displayed]

ACR Breast Density Category b: There are scattered areas of
fibroglandular density.
FINDINGS: There are no findings suspicious for malignancy. The images were
evaluated with computer-aided detection.
IMPRESSION: No mammographic evidence of malignancy. A result letter of this
screening mammogram will be mailed directly to the patient.

RECOMMENDATION:
Screening mammogram in one year. (Code:[id number])

BI-RADS CATEGORY  1: Negative.

## 2022-10-17 NOTE — Progress Notes (Signed)
SUBJECTIVE:   Chief Complaint  Patient presents with   Transitions Of Care   HPI Patient presents to clinic with daughter to transfer care.  No acute concerns today.  Hypertension Asymptomatic.  Compliant with amlodipine 5 mg daily.  Tolerating medication well.  Self discontinued Lasix 2 to 3 months ago.  Denies any headaches, visual changes, chest pain, shortness of breath or lower extremity edema.  Hyperlipidemia History of statin myopathy.  Prescribed Repatha but self discontinued 2 to 3 months ago.  Patient felt medication was related to having increased falls.  Mood disorder Previously prescribed Wellbutrin 150 mg daily, Celexa 20 mg daily and trazodone 50 mg nightly.  Self discontinued Wellbutrin and Celexa.  Continues to take trazodone to help with sleep.  Denies any SI/HI.  Reports no depressed mood and does not feel has increasing anxiety.  Daughter reports more agitation at times.  Patient not interested in restarting medications at this time.  GERD Asymptomatic.  Self discontinued Protonix 40 mg daily.  Rosacea Self discontinued doxycycline 20 mg twice daily.  Reports medication was not working.  Currently has flare up on chin.    Takes over-the-counter herbal medications include CoQ 10 2 capsules daily, Calcium 1 tablet daily, Turmeric 1000 mg twice daily, Brainol with Bioprene daily and immuno 50 mg daily  PERTINENT PMH / PSH: Rosacea Osteopenia Statin myopathy Mood disorder Right parietal meningioma, 7 mm calcified  OBJECTIVE:  BP 118/70   Pulse 76   Temp 98.3 F (36.8 C) (Oral)   Ht '5\' 1"'$  (1.549 m)   Wt 162 lb 12.8 oz (73.8 kg)   SpO2 97%   BMI 30.76 kg/m    Physical Exam Vitals reviewed.  Constitutional:      General: She is not in acute distress.    Appearance: She is not ill-appearing.  HENT:     Head: Normocephalic.     Nose: Nose normal.  Eyes:     Conjunctiva/sclera: Conjunctivae normal.  Neck:     Thyroid: No thyromegaly or thyroid  tenderness.  Cardiovascular:     Rate and Rhythm: Normal rate and regular rhythm.     Heart sounds: Normal heart sounds.  Pulmonary:     Effort: Pulmonary effort is normal.     Breath sounds: Normal breath sounds.  Abdominal:     General: Abdomen is flat. Bowel sounds are normal.     Palpations: Abdomen is soft.  Musculoskeletal:        General: Normal range of motion.     Cervical back: Normal range of motion.  Skin:    Findings: Erythema and rash present.     Comments: Rash on lower chin area consistent with rosacea  Neurological:     Mental Status: She is alert and oriented to person, place, and time. Mental status is at baseline.  Psychiatric:        Mood and Affect: Mood normal.        Behavior: Behavior normal.        Thought Content: Thought content normal.        Judgment: Judgment normal.     ASSESSMENT/PLAN:  Mood disorder (HCC) Assessment & Plan: Chronic.  Stable.  Denies any SI/HI. Self discontinued Wellbutrin and Celexa. Continues to have issues with sleep and remains on trazodone. Continue trazodone 50 mg at night Monitor for worsening mood/anxiety. Follow-up as needed   Essential hypertension Assessment & Plan: Chronic.  Stable. Continue amlodipine 5 mg daily Monitor blood pressure.  Goal less  than 140/90 per JNC 8 guidelines for age. CMET today  Orders: -     CBC with Differential/Platelet -     Comprehensive metabolic panel  Meningioma [facility]) Assessment & Plan: Initially noted on CT head 2021.  MRI 2022 redemonstrated 7 mm partially calcified meningioma right parietal lobe. Patient holding off on neurological evaluation.  Currently asymptomatic Plan to discuss at next visit    Hyperlipidemia LDL goal <70 -     Lipid panel  Vitamin D deficiency Assessment & Plan: Chronic.  Takes vitamin D supplementation. Check vitamin D levels. Continue vitamin D 5000 units daily  Orders: -     VITAMIN D 25 Hydroxy (Vit-D Deficiency,  Fractures)  Prediabetes -     Hemoglobin A1c -     Vitamin B12  Rosacea Assessment & Plan: Chronic.  Self discontinued doxycycline as not effective. Having flareup on chin noted today. Will trial metronidazole gel twice daily  Orders: -     metroNIDAZOLE; Apply 1 Application topically 2 (two) times daily.  Dispense: 45 g; Refill: 3  Coronary artery disease involving native coronary artery of native heart without angina pectoris Assessment & Plan: Chronic.  Asymptomatic.  Unable to tolerate statin secondary to history of statin myalgia. Prescribed Repatha self discontinued 2 to 3 months ago secondary to reported side effect of increased falls. Checking lipids Follow-up with cardiology as scheduled   Gastroesophageal reflux disease, unspecified whether esophagitis present Assessment & Plan: Chronic.  Doing well without PPI. Self discontinued Protonix Recommend healthy lifestyle.   Statin myopathy Assessment & Plan: History of statin intolerance.  Tried multiple statins and reports increasing joint pain and muscle aches.   HCM Medicare annual wellness visit due.  Patient to schedule. Pneumonia up-to-date DEXA up-to-date Flu up-to-date Tdap up-to-date Mammogram up-to-date.  Due 12/23. Colonoscopy 2007.  Tubular adenomas removed.  Recommendation at that time repeat colonoscopy in 5 years.  Do not see repeat colonoscopy.  Will need to follow-up with patient at next visit.  PDMP reviewed  Return in about 6 months (around 04/18/2023).  Carollee Leitz, MD

## 2022-10-17 NOTE — Patient Instructions (Incomplete)
It was a pleasure meeting you today. Thank you for allowing me to take part in your health care.  Our goals for today as we discussed include:  We will get some labs today.  If they are abnormal or we need to do something about them, I will call you.  If they are normal, I will send you a message on MyChart (if it is active) or a letter in the mail.  If you don't hear from [place] in 2 weeks, please call the office at the number below.   Use metronidazole gel 2 times a day for your rosacea  Follow-up in 6 months  If you have any questions or concerns, please do not hesitate to call the office at [phone].  I look forward to our next visit and until then take care and stay safe.  Regards,   Dana Allan, MD   [facility]

## 2022-10-18 ENCOUNTER — Ambulatory Visit (INDEPENDENT_AMBULATORY_CARE_PROVIDER_SITE_OTHER): Payer: Medicare Other | Admitting: Family Medicine

## 2022-10-18 ENCOUNTER — Encounter: Payer: Self-pay | Admitting: Family Medicine

## 2022-10-18 VITALS — BP 118/70 | HR 76 | Temp 98.3°F | Ht 61.0 in | Wt 162.8 lb

## 2022-10-18 DIAGNOSIS — F39 Unspecified mood [affective] disorder: Secondary | ICD-10-CM

## 2022-10-18 DIAGNOSIS — I10 Essential (primary) hypertension: Secondary | ICD-10-CM

## 2022-10-18 DIAGNOSIS — G720 Drug-induced myopathy: Secondary | ICD-10-CM | POA: Diagnosis not present

## 2022-10-18 DIAGNOSIS — E559 Vitamin D deficiency, unspecified: Secondary | ICD-10-CM

## 2022-10-18 DIAGNOSIS — K219 Gastro-esophageal reflux disease without esophagitis: Secondary | ICD-10-CM | POA: Diagnosis not present

## 2022-10-18 DIAGNOSIS — D329 Benign neoplasm of meninges, unspecified: Secondary | ICD-10-CM | POA: Diagnosis not present

## 2022-10-18 DIAGNOSIS — T466X5A Adverse effect of antihyperlipidemic and antiarteriosclerotic drugs, initial encounter: Secondary | ICD-10-CM | POA: Diagnosis not present

## 2022-10-18 DIAGNOSIS — E785 Hyperlipidemia, unspecified: Secondary | ICD-10-CM | POA: Diagnosis not present

## 2022-10-18 DIAGNOSIS — L719 Rosacea, unspecified: Secondary | ICD-10-CM | POA: Diagnosis not present

## 2022-10-18 DIAGNOSIS — R7303 Prediabetes: Secondary | ICD-10-CM

## 2022-10-18 DIAGNOSIS — I2510 Atherosclerotic heart disease of native coronary artery without angina pectoris: Secondary | ICD-10-CM

## 2022-10-18 LAB — CBC WITH DIFFERENTIAL/PLATELET
Basophils Absolute: 0.1 10*3/uL (ref 0.0–0.1)
Basophils Relative: 1 % (ref 0.0–3.0)
Eosinophils Absolute: 0.2 10*3/uL (ref 0.0–0.7)
Eosinophils Relative: 2.7 % (ref 0.0–5.0)
HCT: 39.9 % (ref 36.0–46.0)
Hemoglobin: 13 g/dL (ref 12.0–15.0)
Lymphocytes Relative: 29 % (ref 12.0–46.0)
Lymphs Abs: 1.8 10*3/uL (ref 0.7–4.0)
MCHC: 32.5 g/dL (ref 30.0–36.0)
MCV: 83.6 fl (ref 78.0–100.0)
Monocytes Absolute: 0.5 10*3/uL (ref 0.1–1.0)
Monocytes Relative: 8.5 % (ref 3.0–12.0)
Neutro Abs: 3.6 10*3/uL (ref 1.4–7.7)
Neutrophils Relative %: 58.8 % (ref 43.0–77.0)
Platelets: 277 10*3/uL (ref 150.0–400.0)
RBC: 4.77 Mil/uL (ref 3.87–5.11)
RDW: 15.1 % (ref 11.5–15.5)
WBC: 6.2 10*3/uL (ref 4.0–10.5)

## 2022-10-18 LAB — HEMOGLOBIN A1C: Hgb A1c MFr Bld: 5.8 % (ref 4.6–6.5)

## 2022-10-18 LAB — VITAMIN D 25 HYDROXY (VIT D DEFICIENCY, FRACTURES): VITD: 35.14 ng/mL (ref 30.00–100.00)

## 2022-10-18 LAB — VITAMIN B12: Vitamin B-12: 1077 pg/mL — ABNORMAL HIGH (ref 211–911)

## 2022-10-18 MED ORDER — METRONIDAZOLE 0.75 % EX GEL: 1.0000 | Freq: Two times a day (BID) | CUTANEOUS | 3 refills | Status: DC

## 2022-10-19 LAB — LIPID PANEL
Cholesterol: 262 mg/dL — ABNORMAL HIGH (ref 0–200)
HDL: 58.3 mg/dL (ref >39.00)
LDL Cholesterol: 167 mg/dL — ABNORMAL HIGH (ref 0–99)
NonHDL: 203.98
Total CHOL/HDL Ratio: 4
Triglycerides: 187 mg/dL — ABNORMAL HIGH (ref 0.0–149.0)
VLDL: 37.4 mg/dL (ref 0.0–40.0)

## 2022-10-19 LAB — COMPREHENSIVE METABOLIC PANEL
ALT: 17 U/L (ref 0–35)
AST: 21 U/L (ref 0–37)
Albumin: 4.4 g/dL (ref 3.5–5.2)
Alkaline Phosphatase: 51 U/L (ref 39–117)
BUN: 21 mg/dL (ref 6–23)
CO2: 28 mEq/L (ref 19–32)
Calcium: 10.2 mg/dL (ref 8.4–10.5)
Chloride: 104 mEq/L (ref 96–112)
Creatinine, Ser: 0.79 mg/dL (ref 0.40–1.20)
GFR: 70.8 mL/min (ref >60.00)
Glucose, Bld: 103 mg/dL — ABNORMAL HIGH (ref 70–99)
Potassium: 4.7 mEq/L (ref 3.5–5.1)
Sodium: 143 mEq/L (ref 135–145)
Total Bilirubin: 0.5 mg/dL (ref 0.2–1.2)
Total Protein: 6.9 g/dL (ref 6.0–8.3)

## 2022-10-21 ENCOUNTER — Encounter: Payer: Self-pay | Admitting: Family Medicine

## 2022-10-21 DIAGNOSIS — F39 Unspecified mood [affective] disorder: Secondary | ICD-10-CM | POA: Insufficient documentation

## 2022-10-21 DIAGNOSIS — R7303 Prediabetes: Secondary | ICD-10-CM | POA: Insufficient documentation

## 2022-10-21 NOTE — Assessment & Plan Note (Signed)
Chronic.  Stable.  Denies any SI/HI. Self discontinued Wellbutrin and Celexa. Continues to have issues with sleep and remains on trazodone. Continue trazodone 50 mg at night Monitor for worsening mood/anxiety. Follow-up as needed

## 2022-10-21 NOTE — Assessment & Plan Note (Signed)
Chronic.  Stable. Continue amlodipine 5 mg daily Monitor blood pressure.  Goal less than 140/90 per JNC 8 guidelines for age. CMET today

## 2022-10-21 NOTE — Assessment & Plan Note (Signed)
Chronic.  Doing well without PPI. Self discontinued Protonix Recommend healthy lifestyle.

## 2022-10-21 NOTE — Assessment & Plan Note (Signed)
Chronic.  Asymptomatic.  Unable to tolerate statin secondary to history of statin myalgia. Prescribed Repatha self discontinued 2 to 3 months ago secondary to reported side effect of increased falls. Checking lipids Follow-up with cardiology as scheduled

## 2022-10-21 NOTE — Assessment & Plan Note (Signed)
Chronic.  Self discontinued doxycycline as not effective. Having flareup on chin noted today. Will trial metronidazole gel twice daily

## 2022-10-21 NOTE — Assessment & Plan Note (Signed)
Initially noted on CT head 2021.  MRI 2022 redemonstrated 7 mm partially calcified meningioma right parietal lobe. Patient holding off on neurological evaluation.  Currently asymptomatic Plan to discuss at next visit

## 2022-10-21 NOTE — Assessment & Plan Note (Signed)
Chronic.  Takes vitamin D supplementation. Check vitamin D levels. Continue vitamin D 5000 units daily

## 2022-10-21 NOTE — Assessment & Plan Note (Signed)
History of statin intolerance.  Tried multiple statins and reports increasing joint pain and muscle aches.

## 2022-11-16 ENCOUNTER — Other Ambulatory Visit: Payer: Self-pay | Admitting: Family Medicine

## 2022-11-16 DIAGNOSIS — G4700 Insomnia, unspecified: Secondary | ICD-10-CM

## 2022-12-22 ENCOUNTER — Ambulatory Visit: Payer: Medicare Other | Admitting: Nurse Practitioner

## 2023-01-20 ENCOUNTER — Other Ambulatory Visit: Payer: Self-pay | Admitting: Family Medicine

## 2023-01-20 DIAGNOSIS — I10 Essential (primary) hypertension: Secondary | ICD-10-CM

## 2023-04-18 ENCOUNTER — Ambulatory Visit: Payer: Medicare Other | Admitting: Family Medicine

## 2023-04-25 ENCOUNTER — Ambulatory Visit (INDEPENDENT_AMBULATORY_CARE_PROVIDER_SITE_OTHER): Payer: Medicare Other | Admitting: Family Medicine

## 2023-04-25 ENCOUNTER — Encounter: Payer: Self-pay | Admitting: Family Medicine

## 2023-04-25 VITALS — BP 130/68 | HR 66 | Temp 98.4°F | Resp 16 | Ht 61.0 in | Wt 164.0 lb

## 2023-04-25 DIAGNOSIS — F39 Unspecified mood [affective] disorder: Secondary | ICD-10-CM

## 2023-04-25 DIAGNOSIS — L719 Rosacea, unspecified: Secondary | ICD-10-CM | POA: Diagnosis not present

## 2023-04-25 DIAGNOSIS — I10 Essential (primary) hypertension: Secondary | ICD-10-CM | POA: Diagnosis not present

## 2023-04-25 MED ORDER — METRONIDAZOLE 0.75 % EX GEL: 1.0000 | Freq: Two times a day (BID) | CUTANEOUS | 3 refills | Status: DC

## 2023-04-25 NOTE — Progress Notes (Signed)
SUBJECTIVE:   Chief Complaint  Patient presents with   Medical Management of Chronic Issues   HPI Patient presents to clinic with daughter for chronic care management  No acute concerns today.  Hypertension Asymptomatic.  Compliant with amlodipine 5 mg daily and tolerating well. Denies any headaches, visual changes, chest pain, shortness of breath or lower extremity edema.  Hyperlipidemia Remains off Repatha for concern of medication related ot falls History of statin myopathy.   Mood disorder Continues to remain off medications.  Does not endorse any depressed mood, increase in agitation or increased anxiety.  Not interested in restarting medication. Continues to take trazodone 50 mg at night to help with sleep. Patient denies any SI/HI.  Not interested in psychiatry evaluation or therapy at this time. Self discontinued Wellbutrin and Celexa.   GERD Asymptomatic.  Self discontinued Protonix 40 mg daily.  Rosacea Self discontinued doxycycline 20 mg twice daily.  Reports medication was not working.  Currently has flare up on chin.  Requesting refill for Metrogel.  Takes over-the-counter herbal medications include CoQ 10 2 capsules daily, Calcium 1 tablet daily, Turmeric 1000 mg twice daily, Brainol with Bioprene daily and immuno 50 mg daily  PERTINENT PMH / PSH: Rosacea Osteopenia Statin myopathy Mood disorder Right parietal meningioma, 7 mm calcified  OBJECTIVE:  BP 130/68   Pulse 66   Temp 98.4 F (36.9 C)   Resp 16   Ht 5\' 1"  (1.549 m)   Wt 164 lb (74.4 kg)   SpO2 98%   BMI 30.99 kg/m    Physical Exam Vitals reviewed.  Constitutional:      General: She is not in acute distress.    Appearance: She is not ill-appearing.  HENT:     Head: Normocephalic.     Nose: Nose normal.  Eyes:     Conjunctiva/sclera: Conjunctivae normal.  Neck:     Thyroid: No thyromegaly or thyroid tenderness.  Cardiovascular:     Rate and Rhythm: Normal rate and regular rhythm.      Heart sounds: Normal heart sounds.  Pulmonary:     Effort: Pulmonary effort is normal.     Breath sounds: Normal breath sounds.  Abdominal:     General: Abdomen is flat. Bowel sounds are normal.     Palpations: Abdomen is soft.  Musculoskeletal:        General: Normal range of motion.     Cervical back: Normal range of motion.  Skin:    Findings: Erythema and rash present.     Comments: Rash on lower chin area consistent with rosacea  Neurological:     Mental Status: She is alert and oriented to person, place, and time. Mental status is at baseline.  Psychiatric:        Mood and Affect: Mood normal.        Behavior: Behavior normal.        Thought Content: Thought content normal.        Judgment: Judgment normal.       10/18/2022    8:42 AM 06/06/2022   10:55 AM 06/01/2022    1:25 PM 05/18/2021   10:50 AM 05/17/2020   10:37 AM  Depression screen PHQ 2/9  Decreased Interest 0 0 0 0 0  Down, Depressed, Hopeless 0 0 0 0 0  PHQ - 2 Score 0 0 0 0 0  Altered sleeping     0  Tired, decreased energy     0  Change in appetite  0  Feeling bad or failure about yourself      0  Trouble concentrating     0  Moving slowly or fidgety/restless     0  Suicidal thoughts     0  PHQ-9 Score     0     ASSESSMENT/PLAN:  Mood disorder (HCC) Assessment & Plan: Chronic.  Stable.  Denies any SI/HI. Self discontinued Wellbutrin and Celexa. Continue trazodone 50 mg at night Monitor for worsening mood/anxiety. Can restart Celexa if needed, would be lower dose and would also recommend CBT and psych evaluation   Rosacea Assessment & Plan: Chronic.  Self discontinued doxycycline as not effective. Having flareup on chin noted today. Refill metronidazole gel twice daily  Orders: -     metroNIDAZOLE; Apply 1 Application topically 2 (two) times daily.  Dispense: 45 g; Refill: 3  Essential hypertension Assessment & Plan: Chronic.  Stable. Continue amlodipine 5 mg daily Monitor blood  pressure.  Goal less than 150/90 per JNC 8 guidelines for age.    HCM Medicare annual wellness visit due.  Patient to schedule. Pneumonia completed Shingles completed DEXA up-to-date Flu up-to-date Tdap up-to-date Mammogram due 06/2023 Recommend regular self breast exams Colonoscopy 2007.  Tubular adenomas removed.  Recommendation at that time repeat colonoscopy in 5 years.  Do not see repeat colonoscopy.  Patient not interested in follow up   PDMP reviewed  Return in about 6 months (around 10/26/2023).  Addendum 05/10/2023  Mood Disorder Patient called to request restarting Celexa for increased agitation Restarted Celexa 10 mg daily x 30 tabs Follow up in 4 weeks for evaluation  Dana Allan, MD

## 2023-04-25 NOTE — Patient Instructions (Addendum)
It was a pleasure meeting you today. Thank you for allowing me to take part in your health care.  Our goals for today as we discussed include:  Schedule Medicare Annual Wellness Visit   Blood pressure is good  Refill sent for requested medication  Follow up in 6 months   If you have any questions or concerns, please do not hesitate to call the office at [phone].  I look forward to our next visit and until then take care and stay safe.  Regards,   Dana Allan, MD   [facility]

## 2023-05-08 ENCOUNTER — Other Ambulatory Visit: Payer: Self-pay | Admitting: Family

## 2023-05-10 ENCOUNTER — Telehealth: Payer: Self-pay | Admitting: Family Medicine

## 2023-05-10 NOTE — Telephone Encounter (Signed)
Prescription Request  05/10/2023  LOV: Visit date not found  What is the name of the medication or equipment? citalopram  Have you contacted your pharmacy to request a refill? Yes   Which pharmacy would you like this sent to?  [facility] - [place], [place] - [address] [place] [address] Phone: [phone] Fax: [phone]    Patient notified that their request is being sent to the clinical staff for review and that they should receive a response within 2 business days.   Please advise at Mobile [phone] (mobile)

## 2023-05-10 NOTE — Telephone Encounter (Signed)
Called pt and made her aware that prescription had been sent to pharmacy.

## 2023-05-10 NOTE — Telephone Encounter (Signed)
I called pt, in looking back at Dr. Claris Che note from 8/28 that pt had stopped taking medication.  Pt reports increase irritability and would like to restart medication.  Pt asks that we call her back when prescription has been sent it.

## 2023-05-13 ENCOUNTER — Encounter: Payer: Self-pay | Admitting: Family Medicine

## 2023-05-13 NOTE — Assessment & Plan Note (Addendum)
Chronic.  Stable. Continue amlodipine 5 mg daily Monitor blood pressure.  Goal less than 150/90 per JNC 8 guidelines for age.

## 2023-05-13 NOTE — Assessment & Plan Note (Signed)
Chronic.  Stable.  Denies any SI/HI. Self discontinued Wellbutrin and Celexa. Continue trazodone 50 mg at night Monitor for worsening mood/anxiety. Can restart Celexa if needed, would be lower dose and would also recommend CBT and psych evaluation

## 2023-05-13 NOTE — Assessment & Plan Note (Signed)
Chronic.  Self discontinued doxycycline as not effective. Having flareup on chin noted today. Refill metronidazole gel twice daily

## 2023-05-14 NOTE — Telephone Encounter (Signed)
Appointment made for 05/15/23 @ 11am. Dr. Clent Ridges your note mentioned that you were calling in Celexa 5mg  30 tabs, the prescription was sent in for Celexa 10mg  10 tabs.  Pt plans to pick up medication today.

## 2023-05-15 ENCOUNTER — Encounter: Payer: Self-pay | Admitting: Family Medicine

## 2023-05-15 ENCOUNTER — Ambulatory Visit (INDEPENDENT_AMBULATORY_CARE_PROVIDER_SITE_OTHER): Payer: Medicare Other | Admitting: Family Medicine

## 2023-05-15 VITALS — BP 126/66 | HR 66 | Temp 98.2°F | Resp 16 | Ht 61.0 in | Wt 163.4 lb

## 2023-05-15 DIAGNOSIS — F39 Unspecified mood [affective] disorder: Secondary | ICD-10-CM

## 2023-05-15 DIAGNOSIS — Z23 Encounter for immunization: Secondary | ICD-10-CM | POA: Diagnosis not present

## 2023-05-15 MED ORDER — CITALOPRAM HYDROBROMIDE 10 MG PO TABS
10.0000 mg | ORAL_TABLET | Freq: Every day | ORAL | 3 refills | Status: DC
Start: 2023-05-15 — End: 2023-08-27

## 2023-05-15 NOTE — Progress Notes (Unsigned)
SUBJECTIVE:   Chief Complaint  Patient presents with   Anxiety   HPI Patient presents to clinic with son for follow up Mood disorder  No acute concerns today.  Mood disorder Recently requested to restart Celexa.  Was previously taking 20 mg daily.  Had recently started to have low mood and more irritability after last visit.  Felt that needed to restart SSRI.  Since restarting symptoms have slightly improved and reports feeling a little better.  Mood has lifted and is not as irritated as previously.  Son reports thinks was situational when too much stimulation in home for a few days.    PERTINENT PMH / PSH: Rosacea Osteopenia Statin myopathy Mood disorder Right parietal meningioma, 7 mm calcified  OBJECTIVE:  BP 126/66   Pulse 66   Temp 98.2 F (36.8 C)   Resp 16   Ht 5\' 1"  (1.549 m)   Wt 163 lb 6 oz (74.1 kg)   SpO2 97%   BMI 30.87 kg/m    Physical Exam Vitals reviewed.  Constitutional:      General: She is not in acute distress.    Appearance: Normal appearance. She is obese. She is not ill-appearing, toxic-appearing or diaphoretic.  Eyes:     General:        Right eye: No discharge.        Left eye: No discharge.     Conjunctiva/sclera: Conjunctivae normal.  Cardiovascular:     Rate and Rhythm: Normal rate.  Pulmonary:     Effort: Pulmonary effort is normal.  Musculoskeletal:        General: Normal range of motion.  Skin:    General: Skin is warm and dry.  Neurological:     Mental Status: She is alert and oriented to person, place, and time. Mental status is at baseline.  Psychiatric:        Mood and Affect: Mood normal.        Behavior: Behavior normal. Behavior is not agitated.        Thought Content: Thought content normal.        Judgment: Judgment normal.       05/15/2023   11:19 AM 10/18/2022    8:42 AM 06/06/2022   10:55 AM 06/01/2022    1:25 PM 05/18/2021   10:50 AM  Depression screen PHQ 2/9  Decreased Interest 1 0 0 0 0  Down,  Depressed, Hopeless 0 0 0 0 0  PHQ - 2 Score 1 0 0 0 0  Altered sleeping 0      Tired, decreased energy 1      Change in appetite 0      Feeling bad or failure about yourself  1      Trouble concentrating 0      Moving slowly or fidgety/restless 0      Suicidal thoughts 0      PHQ-9 Score 3           05/15/2023   11:20 AM 10/18/2022    8:42 AM 04/07/2020    9:35 AM 12/02/2019    9:48 AM  GAD 7 : Generalized Anxiety Score  Nervous, Anxious, on Edge 1 0 0 0  Control/stop worrying 1 0 0 1  Worry too much - different things 1 0 0 1  Trouble relaxing 0 0 0 0  Restless 0 0 0 0  Easily annoyed or irritable 1 0 0 1  Afraid - awful might happen 1 0 0 1  Total GAD 7 Score 5 0 0 4  Anxiety Difficulty Somewhat difficult Not difficult at all Not difficult at all Somewhat difficult     ASSESSMENT/PLAN:  Mood disorder [facility]) Assessment & Plan: Chronic.  Stable.  Denies any SI/HI. Continue trazodone 50 mg at night Monitor for worsening mood/anxiety. Continue Celexa 10 mg daily Recommend CBT Mental health resources provided  Orders: -     Citalopram Hydrobromide; Take 1 tablet (10 mg total) by mouth daily.  Dispense: 90 tablet; Refill: 3  Need for influenza vaccination -     Flu Vaccine Trivalent High Dose (Fluad)    PDMP reviewed  Return if symptoms worsen or fail to improve, for PCP.   Dana Allan, MD

## 2023-05-15 NOTE — Patient Instructions (Signed)
It was a pleasure meeting you today. Thank you for allowing me to take part in your health care.  Our goals for today as we discussed include:  Continue Celexa 10 mg daily  If any worsening symptoms please notify MD  [facility]  [address]  [place] [address] [phone]    [email or web address] [address] [place] [phone]   Follow up as scheduled   If you have any questions or concerns, please do not hesitate to call the office at [phone].  I look forward to our next visit and until then take care and stay safe.  Regards,   Dana Allan, MD   [facility]

## 2023-05-20 ENCOUNTER — Encounter: Payer: Self-pay | Admitting: Family Medicine

## 2023-05-20 DIAGNOSIS — Z23 Encounter for immunization: Secondary | ICD-10-CM | POA: Insufficient documentation

## 2023-05-20 NOTE — Assessment & Plan Note (Addendum)
Chronic.  Stable.  Denies any SI/HI. Continue trazodone 50 mg at night Monitor for worsening mood/anxiety. Continue Celexa 10 mg daily Recommend CBT Mental health resources provided

## 2023-06-11 ENCOUNTER — Ambulatory Visit (INDEPENDENT_AMBULATORY_CARE_PROVIDER_SITE_OTHER): Payer: Medicare Other | Admitting: *Deleted

## 2023-06-11 VITALS — Ht 61.0 in | Wt 162.0 lb

## 2023-06-11 DIAGNOSIS — Z0000 Encounter for general adult medical examination without abnormal findings: Secondary | ICD-10-CM

## 2023-06-11 NOTE — Progress Notes (Signed)
Subjective:   Sharon Mcclure is a 82 y.o. female who presents for Medicare Annual (Subsequent) preventive examination.  Visit Complete: Virtual I connected with  Lovenia Shuck on 06/11/23 by a audio enabled telemedicine application and verified that I am speaking with the correct person using two identifiers.  Patient Location: Home  Provider Location: Office/Clinic  I discussed the limitations of evaluation and management by telemedicine. The patient expressed understanding and agreed to proceed.  Vital Signs: Because this visit was a virtual/telehealth visit, some criteria may be missing or patient reported. Any vitals not documented were not able to be obtained and vitals that have been documented are patient reported.  Cardiac Risk Factors include: advanced age (>61men, >32 women);dyslipidemia;hypertension;obesity (BMI >30kg/m2);Other (see comment), Risk factor comments: CAD     Objective:    Today's Vitals   06/11/23 1512  Weight: 162 lb (73.5 kg)  Height: 5\' 1"  (1.549 m)   Body mass index is 30.61 kg/m.     06/11/2023    3:27 PM 06/06/2022   10:53 AM 05/18/2021   10:47 AM 02/22/2021    5:45 PM 05/17/2020   10:47 AM 05/15/2019   10:43 AM 05/13/2018    9:42 AM  Advanced Directives  Does Patient Have a Medical Advance Directive? Yes Yes No  Yes No No  Type of Estate agent of [place];Living will Healthcare Power of [place];Living will  Healthcare Power of [place];Living will Healthcare Power of [place];Living will    Does patient want to make changes to medical advance directive?  No - Patient declined  No - Patient declined No - Patient declined    Copy of Healthcare Power of Attorney in Chart? No - copy requested No - copy requested  No - copy requested No - copy requested    Would patient like information on creating a medical advance directive?   No - Patient declined   Yes (MAU/Ambulatory/Procedural Areas - Information given) Yes (Inpatient  - patient requests chaplain consult to create a medical advance directive)    Current Medications (verified) Outpatient Encounter Medications as of 06/11/2023  Medication Sig   amLODipine (NORVASC) 5 MG tablet TAKE ONE TABLET BY MOUTH ONCE A DAY   citalopram (CELEXA) 10 MG tablet Take 1 tablet (10 mg total) by mouth daily.   metroNIDAZOLE (METROGEL) 0.75 % gel Apply 1 Application topically 2 (two) times daily.   Multiple Vitamin (MULTIVITAMIN) capsule Take 1 capsule by mouth daily.   Nutritional Supplements (FRUIT & VEGETABLE DAILY) CAPS Take by mouth daily.   traZODone (DESYREL) 50 MG tablet TAKE ONE TABLET BY MOUTH EVERY NIGHT AT BEDTIME FOR SLEEP   No facility-administered encounter medications on file as of 06/11/2023.    Allergies (verified) Ampicillin, Penicillins, Crestor [rosuvastatin calcium], Ezetimibe-simvastatin, Niaspan [niacin er], Vytorin [ezetimibe-simvastatin], Zetia [ezetimibe], Zocor [simvastatin], Fenofibrate, and Welchol [colesevelam hcl]   History: Past Medical History:  Diagnosis Date   Abnormal EKG 05/29/2019   Annual physical exam 10/13/2020   Anxiety    CAD (coronary artery disease)    Cancer (HCC)    basal cell-right nostril   Compression fracture of L3 vertebra (HCC) 10/15/2020   Coronary artery calcification 05/29/2019   COVID-19    09/15/21   Fatty liver    GERD (gastroesophageal reflux disease)    Heart murmur    Hemorrhoids    Hiatal hernia    History of skin cancer    Right arm   HTN (hypertension)    Hyperlipidemia  Insomnia    Insomnia    Osteoarthritis of finger of right hand 01/29/2017   Overactive bladder 10/13/2020   Pain in thoracic spine 01/08/2015   Compression fracture at T9 -T11   Pneumonia 05/2015   PVC's (premature ventricular contractions) 09/23/2020   Rosacea 11/24/2013   Stroke determined by clinical assessment (HCC) 02/23/2021   Stroke-like episode 02/23/2021   Thoracic compression fracture (HCC) 01/08/2015    T9-11   Vitamin D deficiency    Past Surgical History:  Procedure Laterality Date   APPENDECTOMY     BASAL CELL CARCINOMA EXCISION     right nostril   CATARACT EXTRACTION W/PHACO Left 01/25/2016   Procedure: CATARACT EXTRACTION PHACO AND INTRAOCULAR LENS PLACEMENT (IOC);  Surgeon: Galen Manila, MD;  Location: [facility] ORS;  Service: Ophthalmology;  Laterality: Left;  [place] 33.9 AP% 15.5 CDE 5.28 Fluid Pack Lot # [id number]   CATARACT EXTRACTION W/PHACO Right 02/22/2016   Procedure: CATARACT EXTRACTION PHACO AND INTRAOCULAR LENS PLACEMENT (IOC);  Surgeon: Galen Manila, MD;  Location: [facility] ORS;  Service: Ophthalmology;  Laterality: Right;  [place] 00:40 AP% 23.6 CDE 9.45 fluid pack lot # [id number]   MOHS SURGERY     bcc right nasal ala 01/2020   TUBAL LIGATION  1981   Family History  Problem Relation Age of Onset   Heart disease Mother 59       aortic valve- had AVR- died 6 wks after   Hyperlipidemia Mother    Aneurysm Father 57       aortic aneursym   Heart disease Father    Heart disease Brother 98       CABG   Colon cancer Neg Hx    Stomach cancer Neg Hx    Breast cancer Neg Hx    Social History   Socioeconomic History   Marital status: Single    Spouse name: Not on file   Number of children: Not on file   Years of education: Not on file   Highest education level: Not on file  Occupational History   Not on file  Tobacco Use   Smoking status: Former    Current packs/day: 0.00    Average packs/day: 0.3 packs/day for 22.0 years (5.5 ttl pk-yrs)    Types: Cigarettes    Start date: 23    Quit date: 72    Years since quitting: 44.8   Smokeless tobacco: Never  Vaping Use   Vaping status: Never Used  Substance and Sexual Activity   Alcohol use: No   Drug use: No   Sexual activity: Not on file  Other Topics Concern   Not on file  Social History Narrative   Retired Scientist, 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    1 daughter    [place] Am[place] up on a farm    Social Determinants of Health    Financial Resource Strain: Low Risk  (06/11/2023)   Overall Financial Resource Strain (CARDIA)    Difficulty of Paying Living Expenses: Not hard at all  Food Insecurity: No Food Insecurity (06/11/2023)   Hunger Vital Sign    Worried About Running Out of Food in the Last Year: Never true    Ran Out of Food in the Last Year: Never true  Transportation Needs: No Transportation Needs (06/11/2023)   PRAPARE - Administrator, Civil Service (Medical): No    Lack of Transportation (Non-Medical): No  Physical Activity: Inactive (06/11/2023)   Exercise Vital Sign    Days of Exercise per Week: 0 days  Minutes of Exercise per Session: 0 min  Stress: No Stress Concern Present (06/11/2023)   [employer] of Occupational Health - Occupational Stress Questionnaire    Feeling of Stress : Only a little  Social Connections: Moderately Isolated (06/11/2023)   Social Connection and Isolation Panel [NHANES]    Frequency of Communication with Friends and Family: More than three times a week    Frequency of Social Gatherings with Friends and Family: More than three times a week    Attends Religious Services: More than 4 times per year    Active Member of [employer] or Organizations: No    Attends Engineer, structural: Never    Marital Status: Divorced    Tobacco Counseling Counseling given: Not Answered   Clinical Intake:  Pre-visit preparation completed: Yes  Pain : No/denies pain     BMI - recorded: 30.61 Nutritional Status: BMI > 30  Obese Nutritional Risks: None Diabetes: No  How often do you need to have someone help you when you read instructions, pamphlets, or other written materials from your doctor or pharmacy?: 1 - Never  Interpreter Needed?: No  Information entered by :: R. Abdulah Iqbal LPN   Activities of Daily Living    06/11/2023    3:14 PM  In your present state of health, do you have any difficulty performing the following activities:  Hearing? 0   Vision? 0  Comment glasses at times  Difficulty concentrating or making decisions? 1  Walking or climbing stairs? 1  Comment balance is not the best  Dressing or bathing? 0  Doing errands, shopping? 1  Comment family helps  Preparing Food and eating ? N  Using the Toilet? N  In the past six months, have you accidently leaked urine? Y  Comment wears pads  Do you have problems with loss of bowel control? N  Managing your Medications? N  Managing your Finances? N  Housekeeping or managing your Housekeeping? N    Patient Care Team: Dana Allan, MD as PCP - General (Family Medicine) End, Cristal Deer, MD as PCP - Cardiology (Cardiology)  Indicate any recent Medical Services you may have received from other than [facility] providers in the past year (date may be approximate).     Assessment:   This is a routine wellness examination for [place].  Hearing/Vision screen Hearing Screening - Comments:: No issues Vision Screening - Comments:: glasses   Goals Addressed             This Visit's Progress    Patient Stated       Continue to eat well and maintain       Depression Screen    06/11/2023    3:21 PM 05/15/2023   11:19 AM 10/18/2022    8:42 AM 06/06/2022   10:55 AM 06/01/2022    1:25 PM 05/18/2021   10:50 AM 05/17/2020   10:37 AM  PHQ 2/9 Scores  PHQ - 2 Score 0 1 0 0 0 0 0  PHQ- 9 Score 0 3     0    Fall Risk    06/11/2023    3:19 PM 05/15/2023   11:19 AM 04/25/2023    9:58 AM 10/18/2022    8:42 AM 06/06/2022   10:57 AM  Fall Risk   Falls in the past year? 0 1 0 0 --  Comment     No falls since last asked one week ago.  Number falls in past yr: 0 1 0 0   Injury  with Fall? 0 1 0 0   Risk for fall due to : No Fall Risks  No Fall Risks No Fall Risks No Fall Risks  Follow up Falls prevention discussed;Falls evaluation completed Falls evaluation completed;Education provided Falls evaluation completed Falls evaluation completed Falls evaluation completed     MEDICARE RISK AT HOME: Medicare Risk at Home Any stairs in or around the home?: Yes If so, are there any without handrails?: No Home free of loose throw rugs in walkways, pet beds, electrical cords, etc?: Yes Adequate lighting in your home to reduce risk of falls?: Yes Life alert?: No Use of a cane, walker or w/c?: Yes (cane) Grab bars in the bathroom?: Yes Shower chair or bench in shower?: Yes Elevated toilet seat or a handicapped toilet?: Yes   Cognitive Function:        06/11/2023    3:27 PM 06/06/2022   10:59 AM 05/17/2020   10:51 AM 05/15/2019   10:58 AM 05/13/2018    9:47 AM  6CIT Screen  What Year? 0 points 0 points 0 points 0 points 0 points  What month? 0 points 0 points 0 points 0 points 0 points  What time? 0 points 0 points 0 points 0 points 0 points  Count back from 20 0 points 0 points 0 points 0 points 0 points  Months in reverse 0 points 0 points 0 points 0 points 0 points  Repeat phrase 0 points 2 points 2 points 0 points 2 points  Total Score 0 points 2 points 2 points 0 points 2 points    Immunizations Immunization History  Administered Date(s) Administered   Fluad Quad(high Dose 65+) 06/02/2019   Fluad Trivalent(High Dose 65+) 05/15/2023   Influenza Whole 05/29/2007   Influenza, High Dose Seasonal PF 06/01/2014, 04/26/2020   Influenza,inj,Quad PF,6+ Mos 06/29/2015, 05/13/2018, 06/01/2022   Influenza-Unspecified 05/28/2013, 05/28/2021   PFIZER(Purple Top)SARS-COV-2 Vaccination 09/26/2019, 10/17/2019   PNEUMOCOCCAL CONJUGATE-20 06/28/2022   Pneumococcal Conjugate-13 04/22/2014   Pneumococcal Polysaccharide-23 06/12/2005, 09/13/2015   Tdap 04/07/2019   Zoster Recombinant(Shingrix) 03/06/2019, 05/21/2019   Zoster, Live 10/08/2007    TDAP status: Up to date  Flu Vaccine status: Up to date  Pneumococcal vaccine status: Up to date  Covid-19 vaccine status: Declined, Education has been provided regarding the importance of this vaccine but  patient still declined. Advised may receive this vaccine at local pharmacy or Health Dept.or vaccine clinic. Aware to provide a copy of the vaccination record if obtained from local pharmacy or Health Dept. Verbalized acceptance and understanding.  Qualifies for Shingles Vaccine? Yes   Zostavax completed Yes   Shingrix Completed?: Yes  Screening Tests Health Maintenance  Topic Date Due   COVID-19 Vaccine (3 - Pfizer risk series) 11/14/2019   Medicare Annual Wellness (AWV)  06/07/2023   MAMMOGRAM  06/29/2023   DTaP/Tdap/Td (2 - Td or Tdap) 04/06/2029   Pneumonia Vaccine 16+ Years old  Completed   INFLUENZA VACCINE  Completed   DEXA SCAN  Completed   Zoster Vaccines- Shingrix  Completed   HPV VACCINES  Aged Out   Hepatitis C Screening  Discontinued    Health Maintenance  Health Maintenance Due  Topic Date Due   COVID-19 Vaccine (3 - Pfizer risk series) 11/14/2019   Medicare Annual Wellness (AWV)  06/07/2023    Colorectal cancer screening: No longer required.   Mammogram status: No longer required due to age.  Bone Density status: Completed 12/2020. Results reflect: Bone density results: OSTEOPENIA. Repeat every 2 years. Wants  to talk with PCP  Lung Cancer Screening: (Low Dose CT Chest recommended if Age 55-80 years, 20 pack-year currently smoking OR have quit w/in 15years.) does not qualify.     Additional Screening:  Hepatitis C Screening: does not qualify; Completed 05/2020  Vision Screening: Recommended annual ophthalmology exams for early detection of glaucoma and other disorders of the eye. Is the patient up to date with their annual eye exam?  Yes  Who is the provider or what is the name of the office in which the patient attends annual eye exams?  Eye If pt is not established with a provider, would they like to be referred to a provider to establish care? No .   Dental Screening: Recommended annual dental exams for proper oral hygiene   Community  Resource Referral / Chronic Care Management: CRR required this visit?  No   CCM required this visit?  No     Plan:     I have personally reviewed and noted the following in the patient's chart:   Medical and social history Use of alcohol, tobacco or illicit drugs  Current medications and supplements including opioid prescriptions. Patient is not currently taking opioid prescriptions. Functional ability and status Nutritional status Physical activity Advanced directives List of other physicians Hospitalizations, surgeries, and ER visits in previous 12 months Vitals Screenings to include cognitive, depression, and falls Referrals and appointments  In addition, I have reviewed and discussed with patient certain preventive protocols, quality metrics, and best practice recommendations. A written personalized care plan for preventive services as well as general preventive health recommendations were provided to patient.     Sydell Axon, LPN   [id number]/05/[id number]   After Visit Summary: (MyChart) Due to this being a telephonic visit, the after visit summary with patients personalized plan was offered to patient via MyChart   Nurse Notes: None

## 2023-06-11 NOTE — Patient Instructions (Signed)
Sharon Mcclure , Thank you for taking time to come for your Medicare Wellness Visit. I appreciate your ongoing commitment to your health goals. Please review the following plan we discussed and let me know if I can assist you in the future.   Referrals/Orders/Follow-Ups/Clinician Recommendations: None  This is a list of the screening recommended for you and due dates:  Health Maintenance  Topic Date Due   COVID-19 Vaccine (3 - Pfizer risk series) 11/14/2019   Mammogram  06/29/2023   Medicare Annual Wellness Visit  06/10/2024   DTaP/Tdap/Td vaccine (2 - Td or Tdap) 04/06/2029   Pneumonia Vaccine  Completed   Flu Shot  Completed   DEXA scan (bone density measurement)  Completed   Zoster (Shingles) Vaccine  Completed   HPV Vaccine  Aged Out   Hepatitis C Screening  Discontinued    Advanced directives: (Copy Requested) Please bring a copy of your health care power of attorney and living will to the office to be added to your chart at your convenience.  Next Medicare Annual Wellness Visit scheduled for next year: Yes 06/11/24 @ 3:40

## 2023-07-12 ENCOUNTER — Ambulatory Visit: Payer: Medicare Other | Admitting: Cardiology

## 2023-07-30 ENCOUNTER — Ambulatory Visit: Payer: Medicare Other | Admitting: Cardiology

## 2023-08-05 ENCOUNTER — Emergency Department: Payer: Medicare Other

## 2023-08-05 ENCOUNTER — Encounter: Payer: Self-pay | Admitting: Emergency Medicine

## 2023-08-05 ENCOUNTER — Other Ambulatory Visit: Payer: Self-pay

## 2023-08-05 ENCOUNTER — Inpatient Hospital Stay
Admission: EM | Admit: 2023-08-05 | Discharge: 2023-08-10 | DRG: 193 | Disposition: A | Discharge status: Home Health | Payer: Medicare Other | Attending: Internal Medicine | Admitting: Internal Medicine

## 2023-08-05 DIAGNOSIS — Z79899 Other long term (current) drug therapy: Secondary | ICD-10-CM

## 2023-08-05 DIAGNOSIS — Z87891 Personal history of nicotine dependence: Secondary | ICD-10-CM

## 2023-08-05 DIAGNOSIS — F419 Anxiety disorder, unspecified: Secondary | ICD-10-CM | POA: Diagnosis present

## 2023-08-05 DIAGNOSIS — J189 Pneumonia, unspecified organism: Principal | ICD-10-CM

## 2023-08-05 DIAGNOSIS — Z8249 Family history of ischemic heart disease and other diseases of the circulatory system: Secondary | ICD-10-CM | POA: Diagnosis not present

## 2023-08-05 DIAGNOSIS — K760 Fatty (change of) liver, not elsewhere classified: Secondary | ICD-10-CM | POA: Diagnosis present

## 2023-08-05 DIAGNOSIS — Z83438 Family history of other disorder of lipoprotein metabolism and other lipidemia: Secondary | ICD-10-CM

## 2023-08-05 DIAGNOSIS — K219 Gastro-esophageal reflux disease without esophagitis: Secondary | ICD-10-CM

## 2023-08-05 DIAGNOSIS — E785 Hyperlipidemia, unspecified: Secondary | ICD-10-CM

## 2023-08-05 DIAGNOSIS — Z8673 Personal history of transient ischemic attack (TIA), and cerebral infarction without residual deficits: Secondary | ICD-10-CM

## 2023-08-05 DIAGNOSIS — I10 Essential (primary) hypertension: Secondary | ICD-10-CM

## 2023-08-05 DIAGNOSIS — Z85828 Personal history of other malignant neoplasm of skin: Secondary | ICD-10-CM

## 2023-08-05 DIAGNOSIS — N3281 Overactive bladder: Secondary | ICD-10-CM | POA: Diagnosis present

## 2023-08-05 DIAGNOSIS — J181 Lobar pneumonia, unspecified organism: Principal | ICD-10-CM | POA: Diagnosis present

## 2023-08-05 DIAGNOSIS — J9601 Acute respiratory failure with hypoxia: Secondary | ICD-10-CM | POA: Diagnosis present

## 2023-08-05 DIAGNOSIS — I2510 Atherosclerotic heart disease of native coronary artery without angina pectoris: Secondary | ICD-10-CM

## 2023-08-05 DIAGNOSIS — Z1152 Encounter for screening for COVID-19: Secondary | ICD-10-CM | POA: Diagnosis not present

## 2023-08-05 DIAGNOSIS — R7303 Prediabetes: Secondary | ICD-10-CM | POA: Diagnosis present

## 2023-08-05 DIAGNOSIS — E876 Hypokalemia: Secondary | ICD-10-CM | POA: Diagnosis present

## 2023-08-05 DIAGNOSIS — F32A Depression, unspecified: Secondary | ICD-10-CM | POA: Diagnosis present

## 2023-08-05 DIAGNOSIS — F39 Unspecified mood [affective] disorder: Secondary | ICD-10-CM | POA: Diagnosis present

## 2023-08-05 DIAGNOSIS — R5381 Other malaise: Secondary | ICD-10-CM | POA: Diagnosis present

## 2023-08-05 DIAGNOSIS — Z888 Allergy status to other drugs, medicaments and biological substances status: Secondary | ICD-10-CM

## 2023-08-05 HISTORY — DX: Acute respiratory failure with hypoxia: J96.01

## 2023-08-05 LAB — CBC WITH DIFFERENTIAL/PLATELET
Abs Immature Granulocytes: 0.75 10*3/uL — ABNORMAL HIGH (ref 0.00–0.07)
Basophils Absolute: 0.2 10*3/uL — ABNORMAL HIGH (ref 0.0–0.1)
Basophils Relative: 1 %
Eosinophils Absolute: 0 10*3/uL (ref 0.0–0.5)
Eosinophils Relative: 0 %
HCT: 36 % (ref 36.0–46.0)
Hemoglobin: 11.9 g/dL — ABNORMAL LOW (ref 12.0–15.0)
Immature Granulocytes: 3 %
Lymphocytes Relative: 6 %
Lymphs Abs: 1.3 10*3/uL (ref 0.7–4.0)
MCH: 27.3 pg (ref 26.0–34.0)
MCHC: 33.1 g/dL (ref 30.0–36.0)
MCV: 82.6 fL (ref 80.0–100.0)
Monocytes Absolute: 1.5 10*3/uL — ABNORMAL HIGH (ref 0.1–1.0)
Monocytes Relative: 6 %
Neutro Abs: 19.7 10*3/uL — ABNORMAL HIGH (ref 1.7–7.7)
Neutrophils Relative %: 84 %
Platelets: 322 10*3/uL (ref 150–400)
RBC: 4.36 MIL/uL (ref 3.87–5.11)
RDW: 14.7 % (ref 11.5–15.5)
WBC: 23.4 10*3/uL — ABNORMAL HIGH (ref 4.0–10.5)
nRBC: 0.1 % (ref 0.0–0.2)

## 2023-08-05 LAB — PHOSPHORUS: Phosphorus: 3 mg/dL (ref 2.5–4.6)

## 2023-08-05 LAB — BASIC METABOLIC PANEL
Anion gap: 13 (ref 5–15)
BUN: 11 mg/dL (ref 8–23)
CO2: 29 mmol/L (ref 22–32)
Calcium: 8.4 mg/dL — ABNORMAL LOW (ref 8.9–10.3)
Chloride: 93 mmol/L — ABNORMAL LOW (ref 98–111)
Creatinine, Ser: 0.45 mg/dL (ref 0.44–1.00)
GFR, Estimated: >60 mL/min (ref >60)
Glucose, Bld: 119 mg/dL — ABNORMAL HIGH (ref 70–99)
Potassium: 3.1 mmol/L — ABNORMAL LOW (ref 3.5–5.1)
Sodium: 135 mmol/L (ref 135–145)

## 2023-08-05 LAB — RESP PANEL BY RT-PCR (RSV, FLU A&B, COVID)  RVPGX2
Influenza A by PCR: NEGATIVE
Influenza B by PCR: NEGATIVE
Resp Syncytial Virus by PCR: NEGATIVE
SARS Coronavirus 2 by RT PCR: NEGATIVE

## 2023-08-05 LAB — MAGNESIUM: Magnesium: 2 mg/dL (ref 1.7–2.4)

## 2023-08-05 MED ORDER — ENOXAPARIN SODIUM 40 MG/0.4ML IJ SOSY
40.0000 mg | PREFILLED_SYRINGE | INTRAMUSCULAR | Status: DC
  Administered 2023-08-05 – 2023-08-10 (×6): 40 mg via SUBCUTANEOUS
  Filled 2023-08-05 (×6): qty 0.4

## 2023-08-05 MED ORDER — LEVOFLOXACIN IN D5W 750 MG/150ML IV SOLN
750.0000 mg | Freq: Once | INTRAVENOUS | Status: AC
  Administered 2023-08-05: 750 mg via INTRAVENOUS
  Filled 2023-08-05: qty 150

## 2023-08-05 MED ORDER — POTASSIUM CHLORIDE CRYS ER 20 MEQ PO TBCR
40.0000 meq | EXTENDED_RELEASE_TABLET | Freq: Once | ORAL | Status: AC
  Administered 2023-08-05: 40 meq via ORAL
  Filled 2023-08-05: qty 2

## 2023-08-05 MED ORDER — AMLODIPINE BESYLATE 5 MG PO TABS
5.0000 mg | ORAL_TABLET | Freq: Every day | ORAL | Status: DC
  Administered 2023-08-05 – 2023-08-10 (×6): 5 mg via ORAL
  Filled 2023-08-05 (×6): qty 1

## 2023-08-05 MED ORDER — LEVOFLOXACIN IN D5W 750 MG/150ML IV SOLN
750.0000 mg | INTRAVENOUS | Status: DC
  Administered 2023-08-06: 750 mg via INTRAVENOUS
  Filled 2023-08-05: qty 150

## 2023-08-05 MED ORDER — SODIUM CHLORIDE 0.9 % IV SOLN: INTRAVENOUS | Status: AC

## 2023-08-05 MED ORDER — ONDANSETRON HCL 4 MG PO TABS
4.0000 mg | ORAL_TABLET | Freq: Four times a day (QID) | ORAL | Status: DC | PRN
  Administered 2023-08-05: 4 mg via ORAL
  Filled 2023-08-05: qty 1

## 2023-08-05 MED ORDER — CITALOPRAM HYDROBROMIDE 20 MG PO TABS
10.0000 mg | ORAL_TABLET | Freq: Every day | ORAL | Status: DC
  Administered 2023-08-05 – 2023-08-10 (×6): 10 mg via ORAL
  Filled 2023-08-05 (×6): qty 1

## 2023-08-05 MED ORDER — ONDANSETRON HCL 4 MG/2ML IJ SOLN: 4.0000 mg | Freq: Four times a day (QID) | INTRAMUSCULAR | Status: DC | PRN

## 2023-08-05 MED ORDER — TRAZODONE HCL 50 MG PO TABS
50.0000 mg | ORAL_TABLET | Freq: Every evening | ORAL | Status: DC | PRN
  Administered 2023-08-05 – 2023-08-09 (×5): 50 mg via ORAL
  Filled 2023-08-05 (×5): qty 1

## 2023-08-05 NOTE — Assessment & Plan Note (Signed)
Blood sugar in 110s Monitor

## 2023-08-05 NOTE — Assessment & Plan Note (Signed)
Decompensated respiratory failure requiring 2 L nasal cannula in setting of multilobar pneumonia on imaging IV Levaquin for infectious coverage Continue supplemental oxygen Monitor

## 2023-08-05 NOTE — Assessment & Plan Note (Signed)
PPI ?

## 2023-08-05 NOTE — Progress Notes (Signed)
Pharmacy Antibiotic Note  Sharon Mcclure is a 82 y.o. female admitted on 08/05/2023 with pneumonia.  Pharmacy has been consulted for levofloxacin dosing.  -Anaphylactic reaction to penicillins. No hx of cephalosporin use  Plan: Patient received Levofloxacin 750 mg x 1 IV in ED Will continue with Levofloxacin 750 mg IV q24h for Crcl 51 ml/min  -f/u renal fxn, cultures, length of tx    Height: 5\' 1"  (154.9 cm) Weight: 72.6 kg (160 lb) IBW/kg (Calculated) : 47.8  Temp (24hrs), Avg:98.6 F (37 C), Min:98.6 F (37 C), Max:98.6 F (37 C)  Recent Labs  Lab 08/05/23 0539  WBC 23.4*  CREATININE 0.45    Estimated Creatinine Clearance: 51.1 mL/min (by C-G formula based on SCr of 0.45 mg/dL).    Allergies  Allergen Reactions   Ampicillin Hives, Shortness Of Breath and Swelling    Has patient had a PCN reaction causing immediate rash, facial/tongue/throat swelling, SOB or lightheadedness with hypotension: Yes Has patient had a PCN reaction causing severe rash involving mucus membranes or skin necrosis: No Has patient had a PCN reaction that required hospitalization No Has patient had a PCN reaction occurring within the last 10 years: No If all of the above answers are "NO", then may proceed with Cephalosporin use.    Penicillins Hives and Shortness Of Breath    Has patient had a PCN reaction causing immediate rash, facial/tongue/throat swelling, SOB or lightheadedness with hypotension: Yes Has patient had a PCN reaction causing severe rash involving mucus membranes or skin necrosis: No Has patient had a PCN reaction that required hospitalization No Has patient had a PCN reaction occurring within the last 10 years: No If all of the above answers are "NO", then may proceed with Cephalosporin use.    Crestor [Rosuvastatin Calcium]     Burning sensation   Ezetimibe-Simvastatin    Niaspan [Niacin Er (Antihyperlipidemic)]     Fatigues, aches.   Vytorin [Ezetimibe-Simvastatin]     Zetia [Ezetimibe]    Zocor [Simvastatin]    Fenofibrate     Constipation and confusion   Welchol [Colesevelam Hcl] Other (See Comments)    Severe bloating even on low dose    Antimicrobials this admission: Levofloxacin  12/8   >>       Dose adjustments this admission:    Microbiology results:   BCx:     UCx:     12/8 Sputum pending:      MRSA PCR:    Thank you for allowing pharmacy to be a part of this patient's care.  Bari Mantis PharmD Clinical Pharmacist 08/05/2023

## 2023-08-05 NOTE — H&P (Signed)
History and Physical    Patient: Sharon Mcclure DOB: July 26, 1941 DOA: 08/05/2023 DOS: the patient was seen and examined on 08/05/2023 PCP: Dana Allan, MD  Patient coming from: Home  Chief Complaint:  Chief Complaint  Patient presents with   Cough   HPI: Sharon Mcclure is a 82 y.o. female with medical history significant of CAD, GERD, hypertension, hyperlipidemia presented with acute respiratory failure hypoxia, pneumonia.  Patient reports sudden onset of progressive shortness of breath cough, malaise over the past 12 to 24 hours.  No recent sick contacts.  No chest pain.  Positive nausea and decreased p.o. intake.  No belly pain or diarrhea.  No focal hemiparesis or confusion.  Non-smoker.  Does report remote history of symptoms like this roughly 6 years ago.  Status post pneumonia shot per the daughter.  No recent sick contacts. Presented to the ER afebrile, hemodynamically stable.  Satting in the mid 80s on room air.  Transition to 2 L nasal cannula to keep O2 sats greater than 94%.  White count 23, hemoglobin 12, platelets 322, COVID flu and RSV negative.  Creatinine 0.45, glucose 119.  CTA chest with multifocal pneumonia as well as potential atypical pattern. Review of Systems: As mentioned in the history of present illness. All other systems reviewed and are negative. Past Medical History:  Diagnosis Date   Abnormal EKG 05/29/2019   Annual physical exam 10/13/2020   Anxiety    CAD (coronary artery disease)    Cancer (HCC)    basal cell-right nostril   Compression fracture of L3 vertebra (HCC) 10/15/2020   Coronary artery calcification 05/29/2019   COVID-19    09/15/21   Fatty liver    GERD (gastroesophageal reflux disease)    Heart murmur    Hemorrhoids    Hiatal hernia    History of skin cancer    Right arm   HTN (hypertension)    Hyperlipidemia    Insomnia    Insomnia    Osteoarthritis of finger of right hand 01/29/2017   Overactive bladder 10/13/2020    Pain in thoracic spine 01/08/2015   Compression fracture at T9 -T11   Pneumonia 05/2015   PVC's (premature ventricular contractions) 09/23/2020   Rosacea 11/24/2013   Stroke determined by clinical assessment (HCC) 02/23/2021   Stroke-like episode 02/23/2021   Thoracic compression fracture (HCC) 01/08/2015   T9-11   Vitamin D deficiency    Past Surgical History:  Procedure Laterality Date   APPENDECTOMY     BASAL CELL CARCINOMA EXCISION     right nostril   CATARACT EXTRACTION W/PHACO Left 01/25/2016   Procedure: CATARACT EXTRACTION PHACO AND INTRAOCULAR LENS PLACEMENT (IOC);  Surgeon: Galen Manila, MD;  Location: [facility] ORS;  Service: Ophthalmology;  Laterality: Left;  [place] 33.9 AP% 15.5 CDE 5.28 Fluid Pack Lot # [id number]   CATARACT EXTRACTION W/PHACO Right 02/22/2016   Procedure: CATARACT EXTRACTION PHACO AND INTRAOCULAR LENS PLACEMENT (IOC);  Surgeon: Galen Manila, MD;  Location: [facility] ORS;  Service: Ophthalmology;  Laterality: Right;  [place] 00:40 AP% 23.6 CDE 9.45 fluid pack lot # [id number]   MOHS SURGERY     bcc right nasal ala 01/2020   TUBAL LIGATION  1981   Social History:  reports that she quit smoking about 44 years ago. Her smoking use included cigarettes. She started smoking about 66 years ago. She has a 5.5 pack-year smoking history. She has never used smokeless tobacco. She reports that she does not drink alcohol and does not use  drugs.  Allergies  Allergen Reactions   Ampicillin Hives, Shortness Of Breath and Swelling    Has patient had a PCN reaction causing immediate rash, facial/tongue/throat swelling, SOB or lightheadedness with hypotension: Yes Has patient had a PCN reaction causing severe rash involving mucus membranes or skin necrosis: No Has patient had a PCN reaction that required hospitalization No Has patient had a PCN reaction occurring within the last 10 years: No If all of the above answers are "NO", then may proceed with Cephalosporin use.     Penicillins Hives and Shortness Of Breath    Has patient had a PCN reaction causing immediate rash, facial/tongue/throat swelling, SOB or lightheadedness with hypotension: Yes Has patient had a PCN reaction causing severe rash involving mucus membranes or skin necrosis: No Has patient had a PCN reaction that required hospitalization No Has patient had a PCN reaction occurring within the last 10 years: No If all of the above answers are "NO", then may proceed with Cephalosporin use.    Crestor [Rosuvastatin Calcium]     Burning sensation   Ezetimibe-Simvastatin    Niaspan [Niacin Er (Antihyperlipidemic)]     Fatigues, aches.   Vytorin [Ezetimibe-Simvastatin]    Zetia [Ezetimibe]    Zocor [Simvastatin]    Fenofibrate     Constipation and confusion   Welchol [Colesevelam Hcl] Other (See Comments)    Severe bloating even on low dose    Family History  Problem Relation Age of Onset   Heart disease Mother 38       aortic valve- had AVR- died 6 wks after   Hyperlipidemia Mother    Aneurysm Father 78       aortic aneursym   Heart disease Father    Heart disease Brother 59       CABG   Colon cancer Neg Hx    Stomach cancer Neg Hx    Breast cancer Neg Hx     Prior to Admission medications   Medication Sig Start Date End Date Taking? Authorizing Provider  amLODipine (NORVASC) 5 MG tablet TAKE ONE TABLET BY MOUTH ONCE A DAY 01/23/23   Dana Allan, MD  citalopram (CELEXA) 10 MG tablet Take 1 tablet (10 mg total) by mouth daily. 05/15/23   Dana Allan, MD  metroNIDAZOLE (METROGEL) 0.75 % gel Apply 1 Application topically 2 (two) times daily. 04/25/23   Dana Allan, MD  Multiple Vitamin (MULTIVITAMIN) capsule Take 1 capsule by mouth daily.    [provider]  Nutritional Supplements (FRUIT & VEGETABLE DAILY) CAPS Take by mouth daily.    [provider]  traZODone (DESYREL) 50 MG tablet TAKE ONE TABLET BY MOUTH EVERY NIGHT AT BEDTIME FOR SLEEP 11/16/22   Dana Allan, MD     Physical Exam: Vitals:   08/05/23 0541 08/05/23 0554 08/05/23 0630 08/05/23 0645  BP: 125/62  (!) 146/71   Pulse: 89 90 81 81  Resp: 20     Temp: 98.6 F (37 C)     TempSrc: Oral     SpO2: 94% 98% (!) 86% 98%  Weight:      Height:       Physical Exam Constitutional:      Appearance: She is normal weight.  HENT:     Head: Normocephalic.     Nose: Nose normal.     Mouth/Throat:     Mouth: Mucous membranes are moist.  Eyes:     Pupils: Pupils are equal, round, and reactive to light.  Cardiovascular:  Rate and Rhythm: Normal rate and regular rhythm.  Pulmonary:     Effort: Pulmonary effort is normal.  Abdominal:     General: Bowel sounds are normal.  Musculoskeletal:        General: Normal range of motion.     Comments: + generalized weakness    Skin:    General: Skin is warm.  Neurological:     General: No focal deficit present.  Psychiatric:        Mood and Affect: Mood normal.     Data Reviewed:  There are no new results to review at this time.  CT Chest Wo Contrast CLINICAL DATA:  Productive cough.  EXAM: CT CHEST WITHOUT CONTRAST  TECHNIQUE: Multidetector CT imaging of the chest was performed following the standard protocol without IV contrast.  RADIATION DOSE REDUCTION: This exam was performed according to the departmental dose-optimization program which includes automated exposure control, adjustment of the mA and/or kV according to patient size and/or use of iterative reconstruction technique.  COMPARISON:  Chest x-ray earlier same day.  Chest CT 06/11/2020  FINDINGS: Cardiovascular: The heart size is normal. No substantial pericardial effusion. Coronary artery calcification is evident. Mitral annular calcification evident. Aortic valve calcification noted. Mild atherosclerotic calcification is noted in the wall of the thoracic aorta.  Mediastinum/Nodes: Mild mediastinal lymphadenopathy evident including 12 mm short axis precarinal  lymph node and 15 mm short axis subcarinal lymph node. Index AP window lymph node measures 15 mm short axis on 47/2. No evidence for gross hilar lymphadenopathy although assessment is limited by the lack of intravenous contrast on the current study. Moderate hiatal hernia. The esophagus has normal imaging features. There is no axillary lymphadenopathy.  Lungs/Pleura: Patchy areas of ground-glass and consolidative airspace disease are noted in all lobes of both lungs. Upper lung disease shows mixed attenuation with associated septal thickening. Scattered areas of mild bronchiectasis evident with relatively diffuse tree-in-bud nodularity and bronchial wall thickening in both lower lobes.  No pleural effusion.  Upper Abdomen: Visualized portion of the upper abdomen shows no acute findings.  Musculoskeletal: No worrisome lytic or sclerotic osseous abnormality.  IMPRESSION: 1. Patchy areas of ground-glass and consolidative airspace disease in all lobes of both lungs. Upper lung disease shows mixed attenuation with associated septal thickening. Imaging features are compatible with multifocal pneumonia. 2. Scattered areas of mild bronchiectasis with relatively diffuse tree-in-bud nodularity and bronchial wall thickening in both lower lobes. Imaging features are compatible with atypical infection. 3. Mild mediastinal lymphadenopathy, likely reactive. Follow-up CT in 3 months recommended. 4. Moderate hiatal hernia. 5.  Aortic Atherosclerosis (ICD10-I70.0).  Electronically Signed   By: Kennith Center M.D.   On: 08/05/2023 08:06 DG Chest 2 View CLINICAL DATA:  Productive cough.  EXAM: CHEST - 2 VIEW  COMPARISON:  06/11/2020  FINDINGS: Focal opacity in the lateral right mid lung has an almost masslike quality. Atelectasis or infiltrate noted at the left base. Interstitial markings are diffusely coarsened with chronic features. Asymmetric elevation right hemidiaphragm  Cardiopericardial silhouette is at upper limits of normal for size. No acute bony abnormality.  IMPRESSION: 1. Focal opacity in the lateral right mid lung has an almost masslike quality. While this may be pneumonia, close follow-up recommended to ensure complete resolution. Consider CT chest to further evaluate. 2. Atelectasis or infiltrate at the left base.  Electronically Signed   By: Kennith Center M.D.   On: 08/05/2023 07:14  Lab Results  Component Value Date   WBC 23.4 (H) 08/05/2023  HGB 11.9 (L) 08/05/2023   HCT 36.0 08/05/2023   MCV 82.6 08/05/2023   PLT 322 08/05/2023   Last metabolic panel Lab Results  Component Value Date   GLUCOSE 119 (H) 08/05/2023   NA 135 08/05/2023   K 3.1 (L) 08/05/2023   CL 93 (L) 08/05/2023   CO2 29 08/05/2023   BUN 11 08/05/2023   CREATININE 0.45 08/05/2023   GFRNONAA >60 08/05/2023   CALCIUM 8.4 (L) 08/05/2023   PHOS 2.4 (L) 06/28/2015   PROT 6.9 10/18/2022   ALBUMIN 4.4 10/18/2022   BILITOT 0.5 10/18/2022   ALKPHOS 51 10/18/2022   AST 21 10/18/2022   ALT 17 10/18/2022   ANIONGAP 13 08/05/2023    Assessment and Plan: * Acute respiratory failure with hypoxia (HCC) Decompensated respiratory failure requiring 2 L nasal cannula in setting of multilobar pneumonia on imaging IV Levaquin for infectious coverage Continue supplemental oxygen Monitor  Pneumonia New O2 requirement of 2 L with noted multi lobar pneumonia on CT of the chest with?  Atypical features Will place on IV Levaquin for infectious coverage Blood and respiratory cultures Monitor  Prediabetes Blood sugar in 110s Monitor  Mood disorder (HCC) Continue Celexa  History of stroke Remote history of CVA evaluation June 2022 Thought to be secondary to [facility] gummy side effects Nonfocal neuroexam today Monitor  Coronary artery disease involving native coronary artery of native heart without angina pectoris Baseline history of CAD with noted calcifications on  prior cardiac imaging No active chest pain Continue home regimen Pt reports no longer taking aspirin  Monitor  GERD (gastroesophageal reflux disease) PPI  Essential hypertension BP stable Titrate home regimen      Advance Care Planning:   Code Status: Full Code   Consults: None   Family Communication: Daughter at the bedside   Severity of Illness: The appropriate patient status for this patient is INPATIENT. Inpatient status is judged to be reasonable and necessary in order to provide the required intensity of service to ensure the patient's safety. The patient's presenting symptoms, physical exam findings, and initial radiographic and laboratory data in the context of their chronic comorbidities is felt to place them at high risk for further clinical deterioration. Furthermore, it is not anticipated that the patient will be medically stable for discharge from the hospital within 2 midnights of admission.   * I certify that at the point of admission it is my clinical judgment that the patient will require inpatient hospital care spanning beyond 2 midnights from the point of admission due to high intensity of service, high risk for further deterioration and high frequency of surveillance required.*  Author: Floydene Flock, MD 08/05/2023 9:47 AM  For on call review [email or web address].

## 2023-08-05 NOTE — Assessment & Plan Note (Signed)
Remote history of CVA evaluation June 2022 Thought to be secondary to [facility] gummy side effects Nonfocal neuroexam today Monitor

## 2023-08-05 NOTE — Evaluation (Signed)
Physical Therapy Evaluation Patient Details Name: Sharon Mcclure MRN: [id number] DOB: [birth year] Today's Date: 08/05/2023  History of Present Illness  Patient is an 82 year old female who presented to ED with congestion and general malaise with worsening couch. Admitted for acute respiratory failure hypoxia pneumonia. Patient had a fever in the beginning but has since resolved. PMH includes CAD, GERD, HTN, HLD   Clinical Impression  Patient is a very pleasant 82 year old female who presents with weakness and limited mobility. Prior to hospital admission, pt was independent with her mobility and dressing and lives at home with her son who does the cooking and driving.  Patient is agreeable to PT evaluation and transitioned EOB without assistance. She is educated on hand placement for walker and performs STS. She then ambulates with frequent cueing for breathing through her nose due to desaturation to Sp02 86%. She requires multiple rest breaks in standing with ambulation and is returned to bed with needs met. Pt would benefit from skilled PT to address noted impairments and functional limitations (see below for any additional details).  Upon hospital discharge, pt would benefit from HHPT to regain strength and PLOF.         If plan is discharge home, recommend the following: A little help with walking and/or transfers;Assistance with cooking/housework;Assist for transportation;Help with stairs or ramp for entrance   Can travel by private vehicle        Equipment Recommendations Rolling walker (2 wheels)  Recommendations for Other Services       Functional Status Assessment Patient has had a recent decline in their functional status and demonstrates the ability to make significant improvements in function in a reasonable and predictable amount of time.     Precautions / Restrictions Precautions Precautions: Fall Restrictions Weight Bearing Restrictions: No Other Position/Activity  Restrictions: Education on use of RW for ambulation due to weakness.      Mobility  Bed Mobility Overal bed mobility: Modified Independent             General bed mobility comments: Needs extra time to get from laying down to seated EOB    Transfers Overall transfer level: Needs assistance Equipment used: Rolling walker (2 wheels) Transfers: Sit to/from Stand Sit to Stand: Contact guard assist           General transfer comment: Cues for hand placement on walker as patient has not utilized one before.    Ambulation/Gait Ambulation/Gait assistance: Contact guard assist Gait Distance (Feet): 30 Feet Assistive device: Rolling walker (2 wheels) Gait Pattern/deviations: Narrow base of support, Shuffle       General Gait Details: Patient desat to 86% Sp02 on 2L of o2 with ambulation. fatigues requires multiple standing rest breaks.  Stairs            Wheelchair Mobility     Tilt Bed    Modified Rankin (Stroke Patients Only)       Balance Overall balance assessment: Needs assistance Sitting-balance support: Feet supported Sitting balance-Leahy Scale: Good Sitting balance - Comments: able to don dressing gown without assistance   Standing balance support: Reliant on assistive device for balance Standing balance-Leahy Scale: Fair Standing balance comment: Patient requires use of RW for ambulation due to unsteadiness and fatigue.                             Pertinent Vitals/Pain Pain Assessment Pain Assessment: No/denies pain    Home  Living Family/patient expects to be discharged to:: Private residence Living Arrangements: Children Available Help at Discharge: Family Type of Home: House Home Access: Stairs to enter   Secretary/administrator of Steps: 6   Home Layout: One level Home Equipment: Cane - single point;Grab bars - tub/shower;Shower seat Additional Comments: Patient has a cane at home but only uses it to get the mail.     Prior Function Prior Level of Function : Independent/Modified Independent             Mobility Comments: Patient uses a cane to get the mail. Independent with dressing and bathing. ADLs Comments: Son does the cooking and driving.     Extremity/Trunk Assessment   Upper Extremity Assessment Upper Extremity Assessment: Defer to OT evaluation    Lower Extremity Assessment Lower Extremity Assessment: Generalized weakness (Grossly 4-/5 strength seated testing. Fatigues in standing.)    Cervical / Trunk Assessment Cervical / Trunk Assessment: Normal  Communication   Communication Communication: No apparent difficulties Cueing Techniques: Verbal cues;Tactile cues  Cognition Arousal: Alert Behavior During Therapy: WFL for tasks assessed/performed Overall Cognitive Status: Within Functional Limits for tasks assessed                                 General Comments: A and O x 4        General Comments General comments (skin integrity, edema, etc.): Patient appears well groomed and nourished.    Exercises Other Exercises Other Exercises: Patient educated on role of PT in acute care setting, safe mobility and transfers. Educated on breathing technique with nasal cannula and use of walker   Assessment/Plan    PT Assessment Patient needs continued PT services  PT Problem List Decreased strength;Decreased activity tolerance;Decreased mobility;Decreased balance;Cardiopulmonary status limiting activity       PT Treatment Interventions DME instruction;Gait training;Stair training;Functional mobility training;Therapeutic activities;Therapeutic exercise;Balance training;Neuromuscular re-education;Patient/family education;Manual techniques    PT Goals (Current goals can be found in the Care Plan section)  Acute Rehab PT Goals Patient Stated Goal: to get stronger and return home PT Goal Formulation: With patient Time For Goal Achievement: 08/19/23 Potential to  Achieve Goals: Fair    Frequency Min 2X/week     Co-evaluation               AM-PAC PT "6 Clicks" Mobility  Outcome Measure Help needed turning from your back to your side while in a flat bed without using bedrails?: None Help needed moving from lying on your back to sitting on the side of a flat bed without using bedrails?: None Help needed moving to and from a bed to a chair (including a wheelchair)?: A Little Help needed standing up from a chair using your arms (e.g., wheelchair or bedside chair)?: A Little Help needed to walk in hospital room?: A Little Help needed climbing 3-5 steps with a railing? : A Little 6 Click Score: 20    End of Session Equipment Utilized During Treatment: Gait belt;Oxygen (2 L via nasal cannula) Activity Tolerance: Patient tolerated treatment well Patient left: in bed;with call bell/phone within reach;with bed alarm set Nurse Communication: Mobility status;Other (comment) (Sp02 drop with ambulation.) PT Visit Diagnosis: Unsteadiness on feet (R26.81);Other abnormalities of gait and mobility (R26.89);Muscle weakness (generalized) (M62.81)    Time: 1610-9604 PT Time Calculation (min) (ACUTE ONLY): 27 min   Charges:   PT Evaluation $PT Eval Moderate Complexity: 1 Mod PT Treatments $Therapeutic Activity: 8-22  mins PT General Charges $$ ACUTE PT VISIT: 1 Visit           Precious Bard, PT, DPT Physical Therapist - [facility]     08/05/2023, 3:41 PM

## 2023-08-05 NOTE — ED Notes (Signed)
Admitting provider at bedside.

## 2023-08-05 NOTE — Progress Notes (Addendum)
PHARMACY CONSULT NOTE - ELECTROLYTES  Pharmacy Consult for Electrolyte Monitoring and Replacement   Recent Labs: Height: 5\' 1"  (154.9 cm) Weight: 72.6 kg (160 lb) IBW/kg (Calculated) : 47.8 Estimated Creatinine Clearance: 51.1 mL/min (by C-G formula based on SCr of 0.45 mg/dL). Potassium (mmol/L)  Date Value  08/05/2023 3.1 (L)  04/01/2012 3.6   Magnesium (mg/dL)  Date Value  10/93/2355 1.8   Calcium (mg/dL)  Date Value  73/22/0254 8.4 (L)   Calcium, Total (mg/dL)  Date Value  27/01/2375 9.4   Albumin (g/dL)  Date Value  28/31/5176 4.4  04/01/2012 3.9   Phosphorus (mg/dL)  Date Value  16/02/3709 2.4 (L)   Sodium (mmol/L)  Date Value  08/05/2023 135  04/01/2012 140   Corrected Ca:      mg/dL       Assessment  Sharon Mcclure is a 82 y.o. female presenting with PNA. PMH significant for HTN, HLD, CAD, GERD. Pharmacy has been consulted to monitor and replace electrolytes.  Diet: reg/heart healthy MIVF: NS @ 75 mL/hr Pertinent medications:    Goal of Therapy: Electrolytes WNL  Plan:  K 3.1  Will order KCL 40 meq po x 1 Check BMP, Mg, Phos with AM labs  Thank you for allowing pharmacy to be a part of this patient's care.  Angelique Blonder, PharmD Clinical Pharmacist 08/05/2023 10:22 AM

## 2023-08-05 NOTE — Assessment & Plan Note (Addendum)
Baseline history of CAD with noted calcifications on prior cardiac imaging No active chest pain Continue home regimen Pt reports no longer taking aspirin  Monitor

## 2023-08-05 NOTE — Assessment & Plan Note (Signed)
New O2 requirement of 2 L with noted multi lobar pneumonia on CT of the chest with?  Atypical features Will place on IV Levaquin for infectious coverage Blood and respiratory cultures Monitor

## 2023-08-05 NOTE — Assessment & Plan Note (Signed)
BP stable Titrate home regimen 

## 2023-08-05 NOTE — ED Notes (Signed)
Pt ambulatory to the restroom using walker without difficulty. Gait steady and even.

## 2023-08-05 NOTE — ED Provider Notes (Signed)
Received in signout. During the patient's workup in the emergency department she was noted to be hypoxemic to the high 80s and placed on 2 L nasal cannula with improvement. Multifocal pneumonia, leukocytosis, and hypoxemic respiratory failure, I spoke with patient and daughter who agreed for admission at this time.  Anaphylactic reaction to penicillins with no history of/blood-borne use will start with Levaquin.  Admission.     RADIOLOGY I independently reviewed and interpreted Chest x-ray and see a consolidation in the right lung field concerning for pneumonia I also reviewed radiologist's formal read.   PROCEDURES:  Critical Care performed: Yes, see critical care procedure note(s)  Procedures   MEDICATIONS ORDERED IN ED: Medications  levofloxacin (LEVAQUIN) IVPB 750 mg (has no administration in time range)       Pilar Jarvis, MD 08/05/23 [phone]

## 2023-08-05 NOTE — ED Provider Notes (Signed)
[facility] Provider Note    Event Date/Time   First MD Initiated Contact with Patient 08/05/23 [phone]     (approximate)   History   Cough   HPI Sharon Mcclure is a 82 y.o. female who presents for evaluation of about a week of nasal congestion and general malaise with a gradually worsening cough.  She reports that the symptoms started about 6 days ago and got better for a day but then over the last 1 to 2 days of gotten worse again.  She said that she feels just generally weak and has no appetite and has not been eating or drinking well.  She has had a persistent cough that is very frustrating and persistent and is productive of green sputum.  She had a fever at the beginning of the process but has not had a fever in at least 4 days.  No chills or rigors.  Denies chest pain and abdominal pain.  No nausea nor vomiting.  No recent dysuria nor increased urinary frequency.     Physical Exam   Triage Vital Signs: ED Triage Vitals  Encounter Vitals Group     BP 08/05/23 0541 125/62     Systolic BP Percentile --      Diastolic BP Percentile --      Pulse Rate 08/05/23 0541 89     Resp 08/05/23 0541 20     Temp 08/05/23 0541 98.6 F (37 C)     Temp Source 08/05/23 0541 Oral     SpO2 08/05/23 0541 94 %     Weight 08/05/23 0535 72.6 kg (160 lb)     Height 08/05/23 0535 1.549 m (5\' 1" )     Head Circumference --      Peak Flow --      Pain Score 08/05/23 0535 0     Pain Loc --      Pain Education --      Exclude from Growth Chart --     Most recent vital signs: Vitals:   08/05/23 0630 08/05/23 0645  BP: (!) 146/71   Pulse: 81 81  Resp:    Temp:    SpO2: (!) 86% 98%    General: Awake, no distress.  Generally well-appearing despite age but appears to be suffering from a viral illness. CV:  Good peripheral perfusion.  Systolic cardiac murmur which the patient says is chronic.  Regular rate and rhythm. Resp:  Normal effort. Speaking easily and  comfortably, no accessory muscle usage nor intercostal retractions.  Some mildly coarse breath sounds throughout.  No wheezing. Abd:  No distention.  No tenderness to palpation of the abdomen.   ED Results / Procedures / Treatments   Labs (all labs ordered are listed, but only abnormal results are displayed) Labs Reviewed  RESP PANEL BY RT-PCR (RSV, FLU A&B, COVID)  [id number]  BASIC METABOLIC PANEL  CBC WITH DIFFERENTIAL/PLATELET     RADIOLOGY Patient's chest x-ray pending at time of transfer of care   PROCEDURES:  Critical Care performed: No  Procedures    IMPRESSION / MDM / ASSESSMENT AND PLAN / ED COURSE  I reviewed the triage vital signs and the nursing notes.                              Differential diagnosis includes, but is not limited to, viral respiratory infection, community-acquired pneumonia, electrolyte or metabolic abnormality, acute kidney injury.  Patient's presentation is most consistent with acute presentation with potential threat to life or bodily function.  Labs/studies ordered: 2 view chest x-ray, respiratory viral panel  Interventions/Medications given:  Medications - No data to display  (Note:  hospital course my include additional interventions and/or labs/studies not listed above.)   Patient's vital signs are stable and within normal limits.  Patient is in no respiratory distress, just has a frequent productive cough.  However her symptoms suggest the possibility of a bacterial superinfection after an initial viral illness.  The patient does not want to stay in the hospital and she is not hypoxic.  However I am concerned about the possibility of infection.  We agreed on the following plan: We will obtain a two-view chest x-ray, and if it is suggestive of pneumonia, the patient may need some basic labs to make sure that she does not need hospitalization (for dehydration, for example).  If she continues to feel appropriate for outpatient  management, she can likely be discharged on antibiotics.  Patient and her daughter, who is at bedside, understand and agree with the plan.         FINAL CLINICAL IMPRESSION(S) / ED DIAGNOSES   Cough Generalized weakness Malaise and fatigue   Rx / DC Orders   ED Discharge Orders     None        Note:  This document was prepared using Dragon voice recognition software and may include unintentional dictation errors.   Loleta Rose, MD 08/05/23 [phone]

## 2023-08-05 NOTE — ED Triage Notes (Addendum)
Pt presents from home for productively green cough, HA, decreased appetite and congestion x 1 week.  She was having fevers, these resolved Wednesday.  Denies CP, sore throat  H/o HTN   Tried dayquil, nyquil, mucinex  In triage, pt is A&Ox4

## 2023-08-05 NOTE — Assessment & Plan Note (Signed)
Continue Celexa

## 2023-08-05 NOTE — ED Notes (Signed)
Call bell answered. Pt request to use restroom, assisted x1 to bed side commode, then back in bed. Bed lowered to lowest setting with call bell in reach. Requested Medicine for "Stomach bubbles" and to aide sleep, RN notified.

## 2023-08-06 ENCOUNTER — Encounter: Payer: Self-pay | Admitting: Internal Medicine

## 2023-08-06 DIAGNOSIS — J9601 Acute respiratory failure with hypoxia: Secondary | ICD-10-CM | POA: Diagnosis present

## 2023-08-06 HISTORY — DX: Acute respiratory failure with hypoxia: J96.01

## 2023-08-06 LAB — COMPREHENSIVE METABOLIC PANEL
ALT: 25 U/L (ref 0–44)
AST: 23 U/L (ref 15–41)
Albumin: 2.1 g/dL — ABNORMAL LOW (ref 3.5–5.0)
Alkaline Phosphatase: 114 U/L (ref 38–126)
Anion gap: 12 (ref 5–15)
BUN: 12 mg/dL (ref 8–23)
CO2: 27 mmol/L (ref 22–32)
Calcium: 8.1 mg/dL — ABNORMAL LOW (ref 8.9–10.3)
Chloride: 97 mmol/L — ABNORMAL LOW (ref 98–111)
Creatinine, Ser: 0.51 mg/dL (ref 0.44–1.00)
GFR, Estimated: >60 mL/min (ref >60)
Glucose, Bld: 99 mg/dL (ref 70–99)
Potassium: 3.3 mmol/L — ABNORMAL LOW (ref 3.5–5.1)
Sodium: 136 mmol/L (ref 135–145)
Total Bilirubin: 0.6 mg/dL (ref <1.2)
Total Protein: 5.7 g/dL — ABNORMAL LOW (ref 6.5–8.1)

## 2023-08-06 LAB — CBC
HCT: 33.1 % — ABNORMAL LOW (ref 36.0–46.0)
Hemoglobin: 11.1 g/dL — ABNORMAL LOW (ref 12.0–15.0)
MCH: 27.1 pg (ref 26.0–34.0)
MCHC: 33.5 g/dL (ref 30.0–36.0)
MCV: 80.9 fL (ref 80.0–100.0)
Platelets: 353 10*3/uL (ref 150–400)
RBC: 4.09 MIL/uL (ref 3.87–5.11)
RDW: 14.8 % (ref 11.5–15.5)
WBC: 23 10*3/uL — ABNORMAL HIGH (ref 4.0–10.5)
nRBC: 0 % (ref 0.0–0.2)

## 2023-08-06 LAB — STREP PNEUMONIAE URINARY ANTIGEN: Strep Pneumo Urinary Antigen: NEGATIVE

## 2023-08-06 MED ORDER — SODIUM CHLORIDE 0.9 % IV SOLN
500.0000 mg | INTRAVENOUS | Status: DC
  Administered 2023-08-06 – 2023-08-08 (×3): 500 mg via INTRAVENOUS
  Filled 2023-08-06 (×3): qty 5

## 2023-08-06 MED ORDER — SODIUM CHLORIDE 0.9 % IV SOLN
1.0000 g | INTRAVENOUS | Status: AC
  Administered 2023-08-06 – 2023-08-10 (×5): 1 g via INTRAVENOUS
  Filled 2023-08-06 (×5): qty 10

## 2023-08-06 MED ORDER — PREDNISONE 20 MG PO TABS
40.0000 mg | ORAL_TABLET | Freq: Every day | ORAL | Status: DC
  Administered 2023-08-07 – 2023-08-10 (×4): 40 mg via ORAL
  Filled 2023-08-06 (×4): qty 2

## 2023-08-06 MED ORDER — POTASSIUM CHLORIDE CRYS ER 20 MEQ PO TBCR
40.0000 meq | EXTENDED_RELEASE_TABLET | Freq: Once | ORAL | Status: AC
  Administered 2023-08-06: 40 meq via ORAL
  Filled 2023-08-06: qty 2

## 2023-08-06 MED ORDER — PANTOPRAZOLE SODIUM 40 MG PO TBEC
40.0000 mg | DELAYED_RELEASE_TABLET | Freq: Two times a day (BID) | ORAL | Status: DC
  Administered 2023-08-06 – 2023-08-10 (×8): 40 mg via ORAL
  Filled 2023-08-06 (×8): qty 1

## 2023-08-06 NOTE — Progress Notes (Signed)
PHARMACY CONSULT NOTE - ELECTROLYTES  Pharmacy Consult for Electrolyte Monitoring and Replacement   Recent Labs: Height: 5\' 1"  (154.9 cm) Weight: 72.6 kg (160 lb) IBW/kg (Calculated) : 47.8 Estimated Creatinine Clearance: 51.1 mL/min (by C-G formula based on SCr of 0.51 mg/dL). Potassium (mmol/L)  Date Value  08/06/2023 3.3 (L)  04/01/2012 3.6   Magnesium (mg/dL)  Date Value  91/47/8295 2.0   Calcium (mg/dL)  Date Value  62/13/0865 8.1 (L)   Calcium, Total (mg/dL)  Date Value  78/46/9629 9.4   Albumin (g/dL)  Date Value  52/84/1324 2.1 (L)  04/01/2012 3.9   Phosphorus (mg/dL)  Date Value  40/05/2724 3.0   Sodium (mmol/L)  Date Value  08/06/2023 136  04/01/2012 140   Corrected Ca:    9.6  mg/dL       Assessment  Sharon Mcclure is a 82 y.o. female presenting with PNA. PMH significant for HTN, HLD, CAD, GERD. Pharmacy has been consulted to monitor and replace electrolytes.  Diet: reg/heart healthy MIVF: NS @ 75 mL/hr Pertinent medications:    Goal of Therapy: Electrolytes WNL  Plan:  K 3.3,  Will order KCL 40 meq po x 1 Check BMP, Mg, Phos with AM labs  Thank you for allowing pharmacy to be a part of this patient's care.  Barrie Folk, PharmD Clinical Pharmacist 08/06/2023 7:22 AM

## 2023-08-06 NOTE — Consult Note (Addendum)
NAME:  Sharon Mcclure, MRN:  [id number], DOB:  [birth year], LOS: 1 ADMISSION DATE:  08/05/2023, CONSULTATION DATE: 08/06/2023 REFERRING MD:  Rosezetta Schlatter MD, CHIEF COMPLAINT:  Acute hypoxic respiratory failure.    History of Present Illness:  This is a case of an 82 year old female patient with a past medical history of hypertension, depression who presented to [facility] on 08/05/2023 for worsening shortness of breath.  She reports that since Thanksgiving she started feeling lousy with increasing shortness of breath daily.  This is associated with a cough productive of gray-yellowish sputum.  She also describes a fever of 101 at home.  Her shortness of breath became worse in the past 2 days which prompted her presentation to the emergency department. She denies any sick contact or exposure.  But thinks she might have contracted something at Thanksgiving gathering.  Social history -never smoker nor vapor used to work as a Interior and spatial designer.  No recent travel history.  Lives at home has 2 dogs and 2 cats.  In the ED, she was found to be hypoxic on 2 L nasal cannula.  Labs with increased white count at 23.4 with neutrophilic predominance.  Kidney function at baseline with a creatinine of 0.45 mg/dL.  CT chest without contrast with bilateral subpleural consolidative opacities with surrounding groundglass opacities.  Also noted septal thickening in the upper lobes with bronchial wall thickening in the lower lobes.  Interim History / Subjective:  Patient reports that her breathing is slightly worse today. Now up to 7L [place] from 2L initially.   Objective   Blood pressure 127/81, pulse 89, temperature 98.1 F (36.7 C), temperature source Oral, resp. rate (!) 21, height 5\' 1"  (1.549 m), weight 72.6 kg, SpO2 95%.        Intake/Output Summary (Last 24 hours) at 08/06/2023 1625 Last data filed at 08/06/2023 1114 Gross per 24 hour  Intake 1150 ml  Output --  Net 1150 ml   Filed Weights    08/05/23 0535  Weight: 72.6 kg    Examination: General: Mild respiratory distress, elderly.  HENT: Supple neck, reactive pupils, EOMI  Lungs: Ronch[place], no wheezing  Cardiovascular: Normal S1, Normal S2, RRR  Abdomen: Soft non tender non distended +BS  Extremities: Warm and well perfused   Labs and imaging were reviewed.   Assessment & Plan:  This is a case of an 82 year old female patient with a past medical history of hypertension, depression who presented to [facility] on 08/05/2023 for worsening shortness of breath.  I think her presentation is consistent with multifocal pneumonia viral vs bacterial CAP. CT finiding with diffuse bilateral subleural consolidative opacities are suggestive. She does have leukocytosis and a fever. Though the CT findings could be seen in OP I think we are dealing with multifocal pneumonia. I would recommend sending a resp viral panel, MRSA swab, sputum culture, urine strep and legionella antigen to complete the work up. Would also recommend starting prednisone 40mg  PO daily per [place] trial. Finally I suggest switching Abx to ceftriaxonw and Azithromycin.   Plan []  Respiratory viral panel  []  Sputum Culture, MRSA swab, Urine strep and legionella.  []  Can start Prednisone 40mg  PO daily []  Ceft and azithromycin   Thank you for the interesting consult.  Pulmonary team will follow.   I spent 60 minutes caring for this patient today, including preparing to see the patient, obtaining a medical history , reviewing a separately obtained history, performing a medically appropriate examination and/or  evaluation, counseling and educating the patient/family/caregiver, documenting clinical information in the electronic health record, and independently interpreting results (not separately reported/billed) and communicating results to the patient/family/caregiver  Janann Colonel, MD  Pulmonary Critical Care 08/06/2023 5:03 PM

## 2023-08-06 NOTE — Progress Notes (Signed)
Mobility Specialist - Progress Note During mobility: SpO2 (90)  Post-mobility:BP(126/72), SPO2(95) on 6L     08/06/23 1100  Oxygen Therapy  O2 Device HFNC  O2 Flow Rate (L/min) 6 L/min  Mobility  Activity Ambulated with assistance in room;Stood at bedside;Dangled on edge of bed;Transferred to/from [facility]  Level of Assistance Standby assist, set-up cues, supervision of patient - no hands on  Assistive Device Front wheel walker  Distance Ambulated (ft) 15 ft  Range of Motion/Exercises Active  Activity Response Tolerated well  Mobility Referral No  Mobility visit 1 Mobility  Mobility Specialist Start Time (ACUTE ONLY) 1050  Mobility Specialist Stop Time (ACUTE ONLY) 1116  Mobility Specialist Time Calculation (min) (ACUTE ONLY) 26 min   Pt resting in bed on 6L HFNC upon entry. Pt STS and ambulates to door in room SpO2: 90% during ambulation to door. Pt returned to EOB to take 2 minute seated rest break before transferring to [facility] Pt returned to bed and left with needs in reach.   Johnathan Hausen Mobility Specialist 08/06/23, 11:19 AM

## 2023-08-06 NOTE — TOC Initial Note (Signed)
Transition of Care [facility]) - Initial/Assessment Note    Patient Details  Name: Sharon Mcclure MRN: [id number] Date of Birth: 05-12-1941  [facility] CM/SW Contact:    Marquita Palms, LCSW Phone Number: 08/06/2023, 10:48 AM  Clinical Narrative:                  CSW acknowledges TOC consult for [facility]. Physical therapy and occupational therapy evaluation completed. CSW received a secure message from OT nurse concerning completion of notes. Patient has home health needs. CSW sent information for patient with [facility].       Patient Goals and CMS Choice            Expected Discharge Plan and Services                                              Prior Living Arrangements/Services                       Activities of Daily Living      Permission Sought/Granted                  Emotional Assessment              Admission diagnosis:  Acute respiratory failure with hypoxia (HCC) [J96.01] Patient Active Problem List   Diagnosis Date Noted   Acute respiratory failure with hypoxia (HCC) 08/05/2023   Need for influenza vaccination 05/20/2023   Mood disorder (HCC) 10/21/2022   Prediabetes 10/21/2022   Edema 12/08/2021   History of stroke 12/08/2021   Meningioma (HCC) 03/16/2021   DDD (degenerative disc disease), lumbar 10/15/2020   Basal cell carcinoma (BCC) of skin of nose 04/07/2020   Dizziness 03/18/2020   Statin myopathy 14/78/2956   LSC (lichen simplex chronicus) 05/30/2019   Dyspnea on exertion 05/29/2019   Osteopenia 03/05/2019   Coronary artery disease involving native coronary artery of native heart without angina pectoris    Trigger middle finger of right hand 12/27/2016   Pneumonia 06/27/2015   Rosacea 11/24/2013   Vitamin D deficiency 09/29/2013   GERD (gastroesophageal reflux disease)    History of skin cancer    Anxiety    Hyperlipidemia LDL goal <70    Essential hypertension    Left groin  pain 02/20/2011   PCP:  Dana Allan, MD Pharmacy:   [facility] [place] [place] - [address] [place] [address] Phone: [phone] Fax: [phone]     Social Determinants of Health (SDOH) Social History: SDOH Screenings   Food Insecurity: No Food Insecurity (06/11/2023)  Housing: Low Risk  (06/11/2023)  Transportation Needs: No Transportation Needs (06/11/2023)  Utilities: Not At Risk (06/11/2023)  Alcohol Screen: Low Risk  (06/11/2023)  Depression (PHQ2-9): Low Risk  (06/11/2023)  Financial Resource Strain: Low Risk  (06/11/2023)  Physical Activity: Inactive (06/11/2023)  Social Connections: Moderately Isolated (06/11/2023)  Stress: No Stress Concern Present (06/11/2023)  Tobacco Use: Medium Risk (08/05/2023)  Health Literacy: Adequate Health Literacy (06/11/2023)   SDOH Interventions:     Readmission Risk Interventions     No data to display

## 2023-08-06 NOTE — Progress Notes (Addendum)
Progress Note   Patient: Sharon Mcclure NUU:[id number] DOB: 1940/12/07 DOA: 08/05/2023     1 DOS: the patient was seen and examined on 08/06/2023    Brief hospital course: Sharon Mcclure is a 82 y.o. female with medical history significant of CAD, GERD, hypertension, hyperlipidemia presented with acute respiratory failure hypoxia, pneumonia.  Patient reports sudden onset of progressive shortness of breath cough, malaise over the past 12 to 24 hours.  No recent sick contacts.  No chest pain.  Positive nausea and decreased p.o. intake.  No belly pain or diarrhea.  No focal hemiparesis or confusion.  Non-smoker.  Does report remote history of symptoms like this roughly 6 years ago.  Status post pneumonia shot per the daughter.  No recent sick contacts. Presented to the ER afebrile, hemodynamically stable.  Satting in the mid 80s on room air.  Transition to 2 L nasal cannula to keep O2 sats greater than 94%.  White count 23, hemoglobin 12, platelets 322, COVID flu and RSV negative.  Creatinine 0.45, glucose 119.  CTA chest with multifocal pneumonia as well as potential atypical pattern.  Patient had worsening respiratory function and was transitioned to high flow.  Pulmonologist consulted.  Assessment and Plan:  Acute respiratory failure with hypoxia (HCC) Chest CT scan showing bilateral multilobar pneumonia Continue IV Levaquin for infectious coverage Continue supplemental oxygen Pulmonologist consulted Currently requiring high flow nasal oxygen We will move patient to PCU   Bilateral pneumonia Continue management as above Follow-up on blood and respiratory cultures Follow-up on urine Legionella, strep Mycoplasma, as well as respiratory panel Monitor saturation closely   Prediabetes Blood sugar in 110s Monitor sugars closely   Mood disorder (HCC) Continue Celexa   History of stroke Remote history of CVA evaluation June 2022 Thought to be secondary to [facility] gummy side  effects Nonfocal neuroexam    Coronary artery disease involving native coronary artery of native heart without angina pectoris Baseline history of CAD with noted calcifications on prior cardiac imaging No active chest pain Continue home regimen Pt reports no longer taking aspirin  Monitor   GERD (gastroesophageal reflux disease) Continue PPI   Essential hypertension BP stable Titrate home regimen     Advance Care Planning:   Code Status: Full Code    Consults: Pulmonologist  Family Communication: Daughter at the bedside     Subjective:  Patient seen in the presence of the daughter Oxygen requirement has increased Denies chest pain nausea vomiting or abdominal pains  Physical Exam: Constitutional:      Appearance: She is normal weight.  HENT:     Head: Normocephalic.     Nose: Nose normal.     Mouth/Throat:     Mouth: Mucous membranes are moist.  Eyes:     Pupils: Pupils are equal, round, and reactive to light.  Cardiovascular:     Rate and Rhythm: Normal rate and regular rhythm.  Pulmonary: Bilaterally decreased air entry Abdominal:     General: Bowel sounds are normal.  Musculoskeletal:        General: Normal range of motion.     Comments: + generalized weakness    Skin:    General: Skin is warm.  Neurological:     General: No focal deficit present.  Psychiatric:        Mood and Affect: Mood normal.     Disposition: Status is: Inpatient Patient requiring higher level of oxygen at this time requiring pulmonologist input   Data Reviewed: I have reviewed  patient chest ct showing bilateral pneumonia    Latest Ref Rng & Units 08/06/2023    6:26 AM 08/05/2023    5:39 AM 10/18/2022    9:30 AM  BMP  Glucose 70 - 99 mg/dL 99  161  096   BUN 8 - 23 mg/dL 12  11  21    Creatinine 0.44 - 1.00 mg/dL 0.45  4.09  8.11   Sodium 135 - 145 mmol/L 136  135  143   Potassium 3.5 - 5.1 mmol/L 3.3  3.1  4.7   Chloride 98 - 111 mmol/L 97  93  104   CO2 22 - 32 mmol/L  27  29  28    Calcium 8.9 - 10.3 mg/dL 8.1  8.4  91.4        Latest Ref Rng & Units 08/06/2023    6:26 AM 08/05/2023    5:39 AM 10/18/2022    9:30 AM  CBC  WBC 4.0 - 10.5 K/uL 23.0  23.4  6.2   Hemoglobin 12.0 - 15.0 g/dL 78.2  95.6  21.3   Hematocrit 36.0 - 46.0 % 33.1  36.0  39.9   Platelets 150 - 400 K/uL 353  322  277.0      Vitals:   08/06/23 1121 08/06/23 1200 08/06/23 1317 08/06/23 1400  BP: 125/71 94/79  115/61  Pulse: 76 84  83  Resp: 20 20  (!) 22  Temp:   98.1 F (36.7 C)   TempSrc:   Oral   SpO2: 96% 94%  95%  Weight:      Height:          Author: Loyce Dys, MD 08/06/2023 2:38 PM  For on call review [email or web address].

## 2023-08-06 NOTE — Evaluation (Signed)
Occupational Therapy Evaluation Patient Details Name: Sharon Mcclure MRN: [id number] DOB: [birth year] Today's Date: 08/06/2023   History of Present Illness Patient is an 82 year old female who presented to ED with congestion and general malaise with worsening couch. Admitted for acute respiratory failure hypoxia pneumonia. Patient had a fever in the beginning but has since resolved. PMH includes CAD, GERD, HTN, HLD   Clinical Impression   Pt was seen for OT evaluation this date. Prior to hospital admission, pt was ambulating with SPC only PRN, indep with basic ADL and received assist from family for cooking and cleaning. Pt lives with her son. Pt presents to acute OT demonstrating impaired ADL performance and functional mobility 2/2 decreased strength, activity tolerance, balance, and  (See OT problem list for additional functional deficits). Pt currently requires supplemental O2 (6L HFNC, down from 8L HFNC, RN cleared to wean). Pt endorsed some fatigue after LB dressing and taking several steps EOB, rating it 4-5/10 rate of perceived exertion. Pt educated in activity pacing and work simplification to support ADL participation while minimizing risk of overexertion and SOB. Pt would benefit from skilled OT services to address noted impairments and functional limitations (see below for any additional details) in order to maximize safety and independence while minimizing falls risk and caregiver burden.     If plan is discharge home, recommend the following: A little help with walking and/or transfers;A little help with bathing/dressing/bathroom;Assistance with cooking/housework;Assist for transportation;Help with stairs or ramp for entrance    Functional Status Assessment  Patient has had a recent decline in their functional status and demonstrates the ability to make significant improvements in function in a reasonable and predictable amount of time.  Equipment Recommendations  None recommended by  OT    Recommendations for Other Services       Precautions / Restrictions Precautions Precautions: Fall Restrictions Weight Bearing Restrictions: No      Mobility Bed Mobility Overal bed mobility: Modified Independent             General bed mobility comments: incr time/effort    Transfers Overall transfer level: Needs assistance Equipment used: None Transfers: Sit to/from Stand Sit to Stand: Contact guard assist                  Balance Overall balance assessment: Needs assistance Sitting-balance support: Feet supported Sitting balance-Leahy Scale: Good     Standing balance support: No upper extremity supported Standing balance-Leahy Scale: Poor Standing balance comment: poor+ dynam standing balance during LB dressing task with slight LOB noted                           ADL either performed or assessed with clinical judgement   ADL Overall ADL's : Needs assistance/impaired                 Upper Body Dressing : Sitting;Set up   Lower Body Dressing: Sit to/from stand;Contact guard assist Lower Body Dressing Details (indicate cue type and reason): Pt able to tread underwear over her feet in sitting, CGA to complete over hips in standing Toilet Transfer: Contact guard assist Toilet Transfer Details (indicate cue type and reason): step pivot (distance limited by HFNC tubing)                 Vision         Perception         Praxis  Pertinent Vitals/Pain Pain Assessment Pain Assessment: No/denies pain     Extremity/Trunk Assessment Upper Extremity Assessment Upper Extremity Assessment: Generalized weakness   Lower Extremity Assessment Lower Extremity Assessment: Generalized weakness   Cervical / Trunk Assessment Cervical / Trunk Assessment: Normal   Communication Communication Communication: No apparent difficulties   Cognition Arousal: Alert Behavior During Therapy: WFL for tasks  assessed/performed Overall Cognitive Status: Within Functional Limits for tasks assessed                                       General Comments  On 8L at start of session, SpO2 99-100%. With RN clearance, pt backed off to 7L then 6L while maintaining SpO2 >94%. Pt denied SOB throughout. Did endorse mild fatigue afterwards. Left on 6L HFNC and RN notified.    Exercises Other Exercises Other Exercises: Pt educated in role of acute OT, ECS including activity pacing and work simplification, and falls prevention strategies.   Shoulder Instructions      Home Living Family/patient expects to be discharged to:: Private residence Living Arrangements: Children Available Help at Discharge: Family Type of Home: House Home Access: Stairs to enter Secretary/administrator of Steps: 6   Home Layout: One level     Bathroom Shower/Tub: Producer, television/film/video: Standard     Home Equipment: Cane - single point;Grab bars - tub/shower;Shower seat;Hand held shower head   Additional Comments: Patient has a cane at home but only uses it to get the mail.      Prior Functioning/Environment Prior Level of Function : Independent/Modified Independent             Mobility Comments: Patient uses a cane to get the mail. Independent with dressing and bathing. ADLs Comments: Son does the cooking and driving.        OT Problem List: Decreased strength;Cardiopulmonary status limiting activity;Decreased activity tolerance;Impaired balance (sitting and/or standing);Decreased knowledge of use of DME or AE      OT Treatment/Interventions: Self-care/ADL training;Therapeutic exercise;Therapeutic activities;Energy conservation;DME and/or AE instruction;Patient/family education;Balance training    OT Goals(Current goals can be found in the care plan section) Acute Rehab OT Goals Patient Stated Goal: go home and feel better OT Goal Formulation: With patient/family Time For Goal  Achievement: 08/20/23 Potential to Achieve Goals: Good ADL Goals Pt Will Transfer to Toilet: with modified independence;ambulating (LRAD) Pt Will Perform Toileting - Clothing Manipulation and hygiene: with modified independence Additional ADL Goal #1: Pt will utilize learned ECS to support ADL participation without VC to initiate use, 3/3 opportunities.  OT Frequency: Min 1X/week    Co-evaluation              AM-PAC OT "6 Clicks" Daily Activity     Outcome Measure Help from another person eating meals?: None Help from another person taking care of personal grooming?: None Help from another person toileting, which includes using toliet, bedpan, or urinal?: A Little Help from another person bathing (including washing, rinsing, drying)?: A Little Help from another person to put on and taking off regular upper body clothing?: None Help from another person to put on and taking off regular lower body clothing?: A Little 6 Click Score: 21   End of Session Equipment Utilized During Treatment: Gait belt;Rolling walker (2 wheels);Oxygen Nurse Communication: Mobility status;Other (comment) (O2)  Activity Tolerance: Patient tolerated treatment well Patient left: in bed;with call bell/phone within reach;with bed alarm  set  OT Visit Diagnosis: Other abnormalities of gait and mobility (R26.89)                Time: 1610-9604 OT Time Calculation (min): 30 min Charges:  OT General Charges $OT Visit: 1 Visit OT Evaluation $OT Eval Moderate Complexity: 1 Mod OT Treatments $Self Care/Home Management : 8-22 mins  Arman Filter., MPH, MS, OTR/L ascom [phone] 08/06/23, 11:58 AM

## 2023-08-07 DIAGNOSIS — J9601 Acute respiratory failure with hypoxia: Secondary | ICD-10-CM | POA: Diagnosis not present

## 2023-08-07 DIAGNOSIS — J189 Pneumonia, unspecified organism: Secondary | ICD-10-CM | POA: Diagnosis not present

## 2023-08-07 LAB — CBC WITH DIFFERENTIAL/PLATELET
Abs Immature Granulocytes: 0.68 10*3/uL — ABNORMAL HIGH (ref 0.00–0.07)
Basophils Absolute: 0.1 10*3/uL (ref 0.0–0.1)
Basophils Relative: 1 %
Eosinophils Absolute: 0 10*3/uL (ref 0.0–0.5)
Eosinophils Relative: 0 %
HCT: 33.7 % — ABNORMAL LOW (ref 36.0–46.0)
Hemoglobin: 10.9 g/dL — ABNORMAL LOW (ref 12.0–15.0)
Immature Granulocytes: 4 %
Lymphocytes Relative: 6 %
Lymphs Abs: 1.2 10*3/uL (ref 0.7–4.0)
MCH: 27.6 pg (ref 26.0–34.0)
MCHC: 32.3 g/dL (ref 30.0–36.0)
MCV: 85.3 fL (ref 80.0–100.0)
Monocytes Absolute: 1.1 10*3/uL — ABNORMAL HIGH (ref 0.1–1.0)
Monocytes Relative: 6 %
Neutro Abs: 15.8 10*3/uL — ABNORMAL HIGH (ref 1.7–7.7)
Neutrophils Relative %: 83 %
Platelets: 322 10*3/uL (ref 150–400)
RBC: 3.95 MIL/uL (ref 3.87–5.11)
RDW: 15 % (ref 11.5–15.5)
WBC: 18.8 10*3/uL — ABNORMAL HIGH (ref 4.0–10.5)
nRBC: 0 % (ref 0.0–0.2)

## 2023-08-07 LAB — MRSA NEXT GEN BY PCR, NASAL: MRSA by PCR Next Gen: NOT DETECTED

## 2023-08-07 LAB — RESPIRATORY PANEL BY PCR

## 2023-08-07 LAB — BASIC METABOLIC PANEL
Anion gap: 8 (ref 5–15)
BUN: 13 mg/dL (ref 8–23)
CO2: 32 mmol/L (ref 22–32)
Calcium: 8.1 mg/dL — ABNORMAL LOW (ref 8.9–10.3)
Chloride: 96 mmol/L — ABNORMAL LOW (ref 98–111)
Creatinine, Ser: 0.64 mg/dL (ref 0.44–1.00)
GFR, Estimated: >60 mL/min (ref >60)
Glucose, Bld: 110 mg/dL — ABNORMAL HIGH (ref 70–99)
Potassium: 4.1 mmol/L (ref 3.5–5.1)
Sodium: 136 mmol/L (ref 135–145)

## 2023-08-07 LAB — STREP PNEUMONIAE URINARY ANTIGEN: Strep Pneumo Urinary Antigen: NEGATIVE

## 2023-08-07 NOTE — Progress Notes (Signed)
PHARMACY CONSULT NOTE - ELECTROLYTES  Pharmacy Consult for Electrolyte Monitoring and Replacement   Recent Labs: Height: 5\' 1"  (154.9 cm) Weight: 72.6 kg (160 lb) IBW/kg (Calculated) : 47.8 Estimated Creatinine Clearance: 51.1 mL/min (by C-G formula based on SCr of 0.64 mg/dL). Potassium (mmol/L)  Date Value  08/07/2023 4.1  04/01/2012 3.6   Magnesium (mg/dL)  Date Value  84/69/6295 2.0   Calcium (mg/dL)  Date Value  28/41/3244 8.1 (L)   Calcium, Total (mg/dL)  Date Value  08/30/7251 9.4   Albumin (g/dL)  Date Value  66/44/0347 2.1 (L)  04/01/2012 3.9   Phosphorus (mg/dL)  Date Value  42/59/5638 3.0   Sodium (mmol/L)  Date Value  08/07/2023 136  04/01/2012 140   Corrected Ca:    9.6  mg/dL       Assessment  Sharon Mcclure is a 82 y.o. female presenting with PNA. PMH significant for HTN, HLD, CAD, GERD. Pharmacy has been consulted to monitor and replace electrolytes.  Diet: reg/heart healthy MIVF: IV lock Pertinent medications:    Goal of Therapy: Electrolytes WNL  Plan:  No electrolytes replacement needed today Will follow BMP in with AM   Thank you for allowing pharmacy to be a part of this patient's care.  Tonie Vizcarrondo Rodriguez-Guzman PharmD, BCPS 08/07/2023 7:53 AM

## 2023-08-07 NOTE — Progress Notes (Signed)
Progress Note   Patient: Sharon Mcclure XLK:[id number] DOB: [birth year] DOA: 08/05/2023     2 DOS: the patient was seen and examined on 08/07/2023    Brief hospital course: CHINENYE DONINI is a 82 y.o. female with medical history significant of CAD, GERD, hypertension, hyperlipidemia presented with acute respiratory failure hypoxia, pneumonia.  Patient reports sudden onset of progressive shortness of breath cough, malaise over the past 12 to 24 hours.  No recent sick contacts.  No chest pain.  Positive nausea and decreased p.o. intake.  No belly pain or diarrhea.  No focal hemiparesis or confusion.  Non-smoker.  Does report remote history of symptoms like this roughly 6 years ago.  Status post pneumonia shot per the daughter.  No recent sick contacts. Presented to the ER afebrile, hemodynamically stable.  Satting in the mid 80s on room air.  Transition to 2 L nasal cannula to keep O2 sats greater than 94%.  White count 23, hemoglobin 12, platelets 322, COVID flu and RSV negative.  Creatinine 0.45, glucose 119.  CTA chest with multifocal pneumonia as well as potential atypical pattern.  Patient had worsening respiratory function and was transitioned to high flow.  Pulmonologist consulted.   Assessment and Plan:   Acute respiratory failure with hypoxia (HCC) Chest CT scan showing bilateral multilobar pneumonia Continue current antibiotic therapy Continue supplemental oxygen Currently requiring high flow nasal oxygen Pulmonologist on board and case discussed Continue incentive spirometry   Bilateral pneumonia Continue management as above Follow-up on blood and respiratory cultures Follow-up on urine Legionella, strep Mycoplasma, as well as respiratory panel Monitor saturation closely   Prediabetes Blood sugar in 110s Continue to monitor sugars closely   Mood disorder (HCC) Continue Celexa   History of stroke Remote history of CVA evaluation June 2022 Thought to be secondary to [facility]  gummy side effects Nonfocal neuroexam    Coronary artery disease involving native coronary artery of native heart without angina pectoris Baseline history of CAD with noted calcifications on prior cardiac imaging No active chest pain Continue home regimen Pt reports no longer taking aspirin   GERD (gastroesophageal reflux disease) Continue PPI   Essential hypertension BP stable Titrate home regimen      Advance Care Planning:   Code Status: Full Code    Consults: Pulmonologist   Family Communication: Daughter at the bedside      Subjective:  Patient seen in the presence of the daughter Patient still requiring 6 L of intranasal oxygen She does not use any oxygen at home Leukocytosis improving She admits to improvement today compared to yesterday   Physical Exam: Constitutional:      Appearance: She is normal weight.  HENT:     Head: Normocephalic.     Nose: Nose normal.     Mouth/Throat:     Mouth: Mucous membranes are moist.  Eyes:     Pupils: Pupils are equal, round, and reactive to light.  Cardiovascular:     Rate and Rhythm: Normal rate and regular rhythm.  Pulmonary: Bilaterally decreased air entry Abdominal:     General: Bowel sounds are normal.  Musculoskeletal:    Generalized weakness improved   Skin:    General: Skin is warm.  Neurological:     General: No focal deficit present.  Psychiatric:        Mood and Affect: Mood normal.        Disposition: Status is: Inpatient Patient requiring higher level of oxygen at this time requiring pulmonologist input  Data Reviewed: I have reviewed patient chest ct showing bilateral pneumonia    Latest Ref Rng & Units 08/07/2023    4:22 AM 08/06/2023    6:26 AM 08/05/2023    5:39 AM  CBC  WBC 4.0 - 10.5 K/uL 18.8  23.0  23.4   Hemoglobin 12.0 - 15.0 g/dL 84.1  32.4  40.1   Hematocrit 36.0 - 46.0 % 33.7  33.1  36.0   Platelets 150 - 400 K/uL 322  353  322        Latest Ref Rng & Units 08/07/2023     4:22 AM 08/06/2023    6:26 AM 08/05/2023    5:39 AM  BMP  Glucose 70 - 99 mg/dL 027  99  253   BUN 8 - 23 mg/dL 13  12  11    Creatinine 0.44 - 1.00 mg/dL 6.64  4.03  4.74   Sodium 135 - 145 mmol/L 136  136  135   Potassium 3.5 - 5.1 mmol/L 4.1  3.3  3.1   Chloride 98 - 111 mmol/L 96  97  93   CO2 22 - 32 mmol/L 32  27  29   Calcium 8.9 - 10.3 mg/dL 8.1  8.1  8.4     Vitals:   08/07/23 0354 08/07/23 0926 08/07/23 1315 08/07/23 1725  BP: 120/70 (!) 118/56 (!) 120/58 135/87  Pulse: 80 78 85 81  Resp: 16 17 17 18   Temp: 98.1 F (36.7 C) 98.3 F (36.8 C) 98.7 F (37.1 C) 98.4 F (36.9 C)  TempSrc: Oral  Oral   SpO2: 97% 95% 96% 95%  Weight:      Height:         Author: Loyce Dys, MD 08/07/2023 5:53 PM  For on call review [email or web address].

## 2023-08-07 NOTE — Plan of Care (Signed)

## 2023-08-07 NOTE — Progress Notes (Signed)
NAME:  Sharon Mcclure, MRN:  [id number], DOB:  [birth year], LOS: 2 ADMISSION DATE:  08/05/2023,  CHIEF COMPLAINT:  Acute Hypoxic Respiratory Failure   History of Present Illness:   82 year old female patient with a past medical history of hypertension, depression who presented to [facility] on 08/05/2023 for worsening shortness of breath.   She reports that since Thanksgiving she started feeling lousy with increasing shortness of breath daily.  This is associated with a cough productive of gray-yellowish sputum.  She also describes a fever of 101 at home.  Her shortness of breath became worse in the past 2 days which prompted her presentation to the emergency department. She denies any sick contact or exposure.  But thinks she might have contracted something at Thanksgiving gathering.   Social history -never smoker nor vapor used to work as a Interior and spatial designer.  No recent travel history.  Lives at home has 2 dogs and 2 cats.   In the ED, she was found to be hypoxic on 2 L nasal cannula.  Labs with increased white count at 23.4 with neutrophilic predominance.  Kidney function at baseline with a creatinine of 0.45 mg/dL.  CT chest without contrast with bilateral subpleural consolidative opacities with surrounding groundglass opacities.  Also noted septal thickening in the upper lobes with bronchial wall thickening in the lower lobes.   Interim History / Subjective:  Increased oxygen requirements yesterday, but feels improved today. Less cough, improved shortness of breath, and no chest pain.  Objective   Blood pressure (!) 120/58, pulse 85, temperature 98.7 F (37.1 C), temperature source Oral, resp. rate 17, height 5\' 1"  (1.549 m), weight 72.6 kg, SpO2 96%.        Intake/Output Summary (Last 24 hours) at 08/07/2023 1356 Last data filed at 08/07/2023 0300 Gross per 24 hour  Intake 565.13 ml  Output 201 ml  Net 364.13 ml   Filed Weights   08/05/23 0535  Weight: 72.6 kg     Examination: Physical Exam Constitutional:      General: She is not in acute distress.    Appearance: Normal appearance. She is not ill-appearing.  Cardiovascular:     Rate and Rhythm: Normal rate and regular rhythm.     Pulses: Normal pulses.     Heart sounds: Normal heart sounds.  Pulmonary:     Effort: Pulmonary effort is normal.     Breath sounds: Rales present. No wheezing.  Neurological:     General: No focal deficit present.     Mental Status: She is alert and oriented to person, place, and time. Mental status is at baseline.      Assessment & Plan:   #Acute Hypoxic Respiratory Failure #Multi-focal Pneumonia  82 year old female with history of HTN presenting with increased shortness of breath and cough productive of sputum. Imaging on presentation is notable for multi-focal patchy consolidative and ground glass opacities concerning for viral vs bacterial pneumonia. She is febrile and has associated leukocytosis and increased oxygen requirements.  Patient started on CAP coverage with Ceftriaxone and Azithromycin, and also started on prednisone yesterday. Infectious workup has so far been negative, with legionella antigen pending. Agree with continued antibiotics and steroids and following up on legionella antigen results.  -continue CTX and azithromycin -continue steroids, consider switch to hydrocortisone 50 mg q6h per CAPECOD trial -follow up legionella antigen and respiratory cultures -wean oxygen as tolerated -incentive spirometry q2hours while awake -PT/OT  Raechel Chute, MD [facility] 08/07/2023  2:28 PM   Labs   CBC: Recent Labs  Lab 08/05/23 0539 08/06/23 0626 08/07/23 0422  WBC 23.4* 23.0* 18.8*  NEUTROABS 19.7*  --  15.8*  HGB 11.9* 11.1* 10.9*  HCT 36.0 33.1* 33.7*  MCV 82.6 80.9 85.3  PLT 322 353 322    Basic Metabolic Panel: Recent Labs  Lab 08/05/23 0539 08/06/23 0626 08/07/23 0422  NA 135 136 136  K 3.1* 3.3*  4.1  CL 93* 97* 96*  CO2 29 27 32  GLUCOSE 119* 99 110*  BUN 11 12 13   CREATININE 0.45 0.51 0.64  CALCIUM 8.4* 8.1* 8.1*  MG 2.0  --   --   PHOS 3.0  --   --    GFR: Estimated Creatinine Clearance: 51.1 mL/min (by C-G formula based on SCr of 0.64 mg/dL). Recent Labs  Lab 08/05/23 0539 08/06/23 0626 08/07/23 0422  WBC 23.4* 23.0* 18.8*    Liver Function Tests: Recent Labs  Lab 08/06/23 0626  AST 23  ALT 25  ALKPHOS 114  BILITOT 0.6  PROT 5.7*  ALBUMIN 2.1*   No results for input(s): "LIPASE", "AMYLASE" in the last 168 hours. No results for input(s): "AMMONIA" in the last 168 hours.  ABG    Component Value Date/Time   TCO2 26 02/22/2021 1616     Coagulation Profile: No results for input(s): "INR", "PROTIME" in the last 168 hours.  Cardiac Enzymes: No results for input(s): "CKTOTAL", "CKMB", "CKMBINDEX", "TROPONINI" in the last 168 hours.  HbA1C: Hgb A1c MFr Bld  Date/Time Value Ref Range Status  10/18/2022 09:30 AM 5.8 4.6 - 6.5 % Final    Comment:    Glycemic Control Guidelines for People with Diabetes:Non Diabetic:  <6%Goal of Therapy: <7%Additional Action Suggested:  >8%   12/22/2021 09:17 AM 5.7 4.6 - 6.5 % Final    Comment:    Glycemic Control Guidelines for People with Diabetes:Non Diabetic:  <6%Goal of Therapy: <7%Additional Action Suggested:  >8%     CBG: No results for input(s): "GLUCAP" in the last 168 hours.  Past Medical History:  She,  has a past medical history of Abnormal EKG (05/29/2019), Annual physical exam (10/13/2020), Anxiety, CAD (coronary artery disease), Cancer (HCC), Compression fracture of L3 vertebra (HCC) (10/15/2020), Coronary artery calcification (05/29/2019), COVID-19, Fatty liver, GERD (gastroesophageal reflux disease), Heart murmur, Hemorrhoids, Hiatal hernia, History of skin cancer, HTN (hypertension), Hyperlipidemia, Insomnia, Insomnia, Osteoarthritis of finger of right hand (01/29/2017), Overactive bladder (10/13/2020),  Pain in thoracic spine (01/08/2015), Pneumonia (05/2015), PVC's (premature ventricular contractions) (09/23/2020), Rosacea (11/24/2013), Stroke determined by clinical assessment (HCC) (02/23/2021), Stroke-like episode (02/23/2021), Thoracic compression fracture (HCC) (01/08/2015), and Vitamin D deficiency.   Surgical History:   Past Surgical History:  Procedure Laterality Date   APPENDECTOMY     BASAL CELL CARCINOMA EXCISION     right nostril   CATARACT EXTRACTION W/PHACO Left 01/25/2016   Procedure: CATARACT EXTRACTION PHACO AND INTRAOCULAR LENS PLACEMENT (IOC);  Surgeon: Galen Manila, MD;  Location: [facility] ORS;  Service: Ophthalmology;  Laterality: Left;  [place] 33.9 AP% 15.5 CDE 5.28 Fluid Pack Lot # [id number]   CATARACT EXTRACTION W/PHACO Right 02/22/2016   Procedure: CATARACT EXTRACTION PHACO AND INTRAOCULAR LENS PLACEMENT (IOC);  Surgeon: Galen Manila, MD;  Location: [facility] ORS;  Service: Ophthalmology;  Laterality: Right;  [place] 00:40 AP% 23.6 CDE 9.45 fluid pack lot # [id number]   MOHS SURGERY     bcc right nasal ala 01/2020   TUBAL LIGATION  1981     Social  History:   reports that she quit smoking about 44 years ago. Her smoking use included cigarettes. She started smoking about 66 years ago. She has a 5.5 pack-year smoking history. She has never used smokeless tobacco. She reports that she does not drink alcohol and does not use drugs.   Family History:  Her family history includes Aneurysm (age of onset: 20) in her father; Heart disease in her father; Heart disease (age of onset: 42) in her brother; Heart disease (age of onset: 32) in her mother; Hyperlipidemia in her mother. There is no history of Colon cancer, Stomach cancer, or Breast cancer.   Allergies Allergies  Allergen Reactions   Ampicillin Hives, Shortness Of Breath and Swelling    Has patient had a PCN reaction causing immediate rash, facial/tongue/throat swelling, SOB or lightheadedness with hypotension: Yes Has  patient had a PCN reaction causing severe rash involving mucus membranes or skin necrosis: No Has patient had a PCN reaction that required hospitalization No Has patient had a PCN reaction occurring within the last 10 years: No If all of the above answers are "NO", then may proceed with Cephalosporin use.    Penicillins Hives and Shortness Of Breath    Has patient had a PCN reaction causing immediate rash, facial/tongue/throat swelling, SOB or lightheadedness with hypotension: Yes Has patient had a PCN reaction causing severe rash involving mucus membranes or skin necrosis: No Has patient had a PCN reaction that required hospitalization No Has patient had a PCN reaction occurring within the last 10 years: No If all of the above answers are "NO", then may proceed with Cephalosporin use.    Crestor [Rosuvastatin Calcium]     Burning sensation   Ezetimibe-Simvastatin    Niaspan [Niacin Er (Antihyperlipidemic)]     Fatigues, aches.   Vytorin [Ezetimibe-Simvastatin]    Zetia [Ezetimibe]    Zocor [Simvastatin]    Fenofibrate     Constipation and confusion   Welchol [Colesevelam Hcl] Other (See Comments)    Severe bloating even on low dose     Home Medications  Prior to Admission medications   Medication Sig Start Date End Date Taking? Authorizing Provider  amLODipine (NORVASC) 5 MG tablet TAKE ONE TABLET BY MOUTH ONCE A DAY 01/23/23  Yes Dana Allan, MD  citalopram (CELEXA) 10 MG tablet Take 1 tablet (10 mg total) by mouth daily. 05/15/23  Yes Dana Allan, MD  metroNIDAZOLE (METROGEL) 0.75 % gel Apply 1 Application topically 2 (two) times daily. 04/25/23  Yes Dana Allan, MD  Multiple Vitamin (MULTIVITAMIN) capsule Take 1 capsule by mouth daily.   Yes [provider]  Nutritional Supplements (FRUIT & VEGETABLE DAILY) CAPS Take by mouth daily.   Yes [provider]  pantoprazole (PROTONIX) 40 MG tablet Take 40 mg by mouth 2 (two) times daily before a meal. 07/18/23  Yes  [provider]  traZODone (DESYREL) 50 MG tablet TAKE ONE TABLET BY MOUTH EVERY NIGHT AT BEDTIME FOR SLEEP 11/16/22  Yes Dana Allan, MD      I spent 35 minutes caring for this patient today, including preparing to see the patient, obtaining a medical history , reviewing a separately obtained history, performing a medically appropriate examination and/or evaluation, counseling and educating the patient/family/caregiver, ordering medications, tests, or procedures, documenting clinical information in the electronic health record, and independently interpreting results (not separately reported/billed) and communicating results to the patient/family/caregiver

## 2023-08-07 NOTE — TOC Progression Note (Signed)
Transition of Care [facility]) - Progression Note    Patient Details  Name: Sharon Mcclure MRN: [id number] Date of Birth: Mar 16, 1941  Transition of Care [facility]) CM/SW Contact  Truddie Hidden, RN Phone Number: 08/07/2023, 3:45 PM  Clinical Narrative:    Spoke with patient and her daughter at the bedside regarding therapy's recommendation therapy's recommendation for [facility] PT/OT. Patient is agreeable to [facility] PT/OT and does not have a choice of an agency. Patient advised the accepting agency will contact him/her directly to scheduled SOC within 48 post discharge. Patient advised a RW will be requested and delivered to her room. Patient daughter will transport her home at discharge.   Referral sent to [facility] from [facility].   RW request sent to Jon from [employer].              Expected Discharge Plan and Services                                               Social Determinants of Health (SDOH) Interventions SDOH Screenings   Food Insecurity: No Food Insecurity (08/06/2023)  Housing: Low Risk  (08/06/2023)  Transportation Needs: No Transportation Needs (08/06/2023)  Utilities: Not At Risk (08/06/2023)  Alcohol Screen: Low Risk  (06/11/2023)  Depression (PHQ2-9): Low Risk  (06/11/2023)  Financial Resource Strain: Low Risk  (06/11/2023)  Physical Activity: Inactive (06/11/2023)  Social Connections: Moderately Isolated (06/11/2023)  Stress: No Stress Concern Present (06/11/2023)  Tobacco Use: Medium Risk (08/06/2023)  Health Literacy: Adequate Health Literacy (06/11/2023)    Readmission Risk Interventions    08/06/2023   10:55 AM  Readmission Risk Prevention Plan  Post Dischage Appt Complete  Medication Screening Complete  Transportation Screening Complete

## 2023-08-07 NOTE — Plan of Care (Signed)
  Problem: Clinical Measurements: Goal: Ability to maintain clinical measurements within normal limits will improve Outcome: Progressing Goal: Respiratory complications will improve Outcome: Not Progressing Goal: Cardiovascular complication will be avoided Outcome: Progressing

## 2023-08-08 DIAGNOSIS — J9601 Acute respiratory failure with hypoxia: Secondary | ICD-10-CM | POA: Diagnosis not present

## 2023-08-08 LAB — CBC WITH DIFFERENTIAL/PLATELET
Abs Immature Granulocytes: 0.69 10*3/uL — ABNORMAL HIGH (ref 0.00–0.07)
Basophils Absolute: 0 10*3/uL (ref 0.0–0.1)
Basophils Relative: 0 %
Eosinophils Absolute: 0 10*3/uL (ref 0.0–0.5)
Eosinophils Relative: 0 %
HCT: 31.9 % — ABNORMAL LOW (ref 36.0–46.0)
Hemoglobin: 10.4 g/dL — ABNORMAL LOW (ref 12.0–15.0)
Immature Granulocytes: 4 %
Lymphocytes Relative: 8 %
Lymphs Abs: 1.3 10*3/uL (ref 0.7–4.0)
MCH: 27.1 pg (ref 26.0–34.0)
MCHC: 32.6 g/dL (ref 30.0–36.0)
MCV: 83.1 fL (ref 80.0–100.0)
Monocytes Absolute: 0.9 10*3/uL (ref 0.1–1.0)
Monocytes Relative: 6 %
Neutro Abs: 12.8 10*3/uL — ABNORMAL HIGH (ref 1.7–7.7)
Neutrophils Relative %: 82 %
Platelets: 371 10*3/uL (ref 150–400)
RBC: 3.84 MIL/uL — ABNORMAL LOW (ref 3.87–5.11)
RDW: 14.9 % (ref 11.5–15.5)
WBC: 15.8 10*3/uL — ABNORMAL HIGH (ref 4.0–10.5)
nRBC: 0 % (ref 0.0–0.2)

## 2023-08-08 LAB — BASIC METABOLIC PANEL
Anion gap: 10 (ref 5–15)
BUN: 13 mg/dL (ref 8–23)
CO2: 32 mmol/L (ref 22–32)
Calcium: 8.3 mg/dL — ABNORMAL LOW (ref 8.9–10.3)
Chloride: 96 mmol/L — ABNORMAL LOW (ref 98–111)
Creatinine, Ser: 0.52 mg/dL (ref 0.44–1.00)
GFR, Estimated: >60 mL/min (ref >60)
Glucose, Bld: 114 mg/dL — ABNORMAL HIGH (ref 70–99)
Potassium: 3.7 mmol/L (ref 3.5–5.1)
Sodium: 138 mmol/L (ref 135–145)

## 2023-08-08 LAB — LEGIONELLA PNEUMOPHILA SEROGP 1 UR AG: L. pneumophila Serogp 1 Ur Ag: NEGATIVE

## 2023-08-08 LAB — MYCOPLASMA PNEUMONIAE ANTIBODY, IGM: Mycoplasma pneumo IgM: <770 U/mL (ref 0–769)

## 2023-08-08 MED ORDER — GUAIFENESIN-DM 100-10 MG/5ML PO SYRP
5.0000 mL | ORAL_SOLUTION | ORAL | Status: DC | PRN
  Administered 2023-08-08: 5 mL via ORAL
  Filled 2023-08-08: qty 10

## 2023-08-08 MED ORDER — DM-GUAIFENESIN ER 30-600 MG PO TB12
1.0000 | ORAL_TABLET | Freq: Two times a day (BID) | ORAL | Status: DC
  Administered 2023-08-08 – 2023-08-10 (×4): 1 via ORAL
  Filled 2023-08-08 (×4): qty 1

## 2023-08-08 MED ORDER — HYDROCOD POLI-CHLORPHE POLI ER 10-8 MG/5ML PO SUER: 5.0000 mL | Freq: Every evening | ORAL | Status: DC | PRN

## 2023-08-08 NOTE — Progress Notes (Signed)
Physical Therapy Treatment Patient Details Name: Sharon Mcclure MRN: [id number] DOB: 29-Mar-1941 Today's Date: 08/08/2023   History of Present Illness Patient is an 82 year old female who presented to ED with congestion and general malaise with worsening couch. Admitted for acute respiratory failure hypoxia pneumonia. Patient had a fever in the beginning but has since resolved. PMH includes CAD, GERD, HTN, HLD    PT Comments  Pt was pleasant and motivated to participate during the session and put forth good effort throughout. Pt found on 4.5L O2/min at rest with SpO2 in the low 90s and HR WNL.  Pt ambulated on 6L with portable tank secondary to only having the option for 4 or 6L.  Pt's SpO2 remained >/= 90% measured frequently with activity/ambulation with HR WNL and with no adverse symptoms other than mild fatigue with exertion.  Pt's self-reported difficulty level of walking 40 feet as "medium".  Pt will benefit from continued PT services upon discharge to safely address deficits listed in patient problem list for decreased caregiver assistance and eventual return to PLOF.      If plan is discharge home, recommend the following: A little help with walking and/or transfers;Assistance with cooking/housework;Assist for transportation;Help with stairs or ramp for entrance   Can travel by private vehicle        Equipment Recommendations  Rolling walker (2 wheels)    Recommendations for Other Services       Precautions / Restrictions Precautions Precautions: Fall Restrictions Weight Bearing Restrictions: No Other Position/Activity Restrictions: Watch SpO2     Mobility  Bed Mobility Overal bed mobility: Modified Independent             General bed mobility comments: Min extra time and effort only    Transfers Overall transfer level: Needs assistance Equipment used: Rolling walker (2 wheels) Transfers: Sit to/from Stand Sit to Stand: Supervision           General  transfer comment: Good control and stability from various height surfaces with min verbal cues for hand placement    Ambulation/Gait Ambulation/Gait assistance: Contact guard assist, Supervision Gait Distance (Feet): 40 Feet Assistive device: Rolling walker (2 wheels) Gait Pattern/deviations: Decreased step length - right, Step-to pattern, Decreased step length - left, Trunk flexed Gait velocity: decreased     General Gait Details: Slow cadence with short B step length but steady without LOB   Stairs             Wheelchair Mobility     Tilt Bed    Modified Rankin (Stroke Patients Only)       Balance Overall balance assessment: Needs assistance   Sitting balance-Leahy Scale: Normal     Standing balance support: Bilateral upper extremity supported, During functional activity Standing balance-Leahy Scale: Good                              Cognition Arousal: Alert Behavior During Therapy: WFL for tasks assessed/performed Overall Cognitive Status: Within Functional Limits for tasks assessed                                          Exercises Other Exercises Other Exercises: Pt education on physiological benefits of out of bed to chair and general principles of gradual activity progression    General Comments  Pertinent Vitals/Pain Pain Assessment Pain Assessment: No/denies pain    Home Living                          Prior Function            PT Goals (current goals can now be found in the care plan section) Progress towards PT goals: Progressing toward goals    Frequency    Min 1X/week      PT Plan      Co-evaluation              AM-PAC PT "6 Clicks" Mobility   Outcome Measure  Help needed turning from your back to your side while in a flat bed without using bedrails?: None Help needed moving from lying on your back to sitting on the side of a flat bed without using bedrails?:  None Help needed moving to and from a bed to a chair (including a wheelchair)?: A Little Help needed standing up from a chair using your arms (e.g., wheelchair or bedside chair)?: A Little Help needed to walk in hospital room?: A Little Help needed climbing 3-5 steps with a railing? : A Little 6 Click Score: 20    End of Session Equipment Utilized During Treatment: Gait belt;Oxygen Activity Tolerance: Patient tolerated treatment well Patient left: in chair;with call bell/phone within reach;with chair alarm set;with family/visitor present Nurse Communication: Mobility status PT Visit Diagnosis: Unsteadiness on feet (R26.81);Other abnormalities of gait and mobility (R26.89);Muscle weakness (generalized) (M62.81)     Time: 4098-1191 PT Time Calculation (min) (ACUTE ONLY): 25 min  Charges:    $Gait Training: 8-22 mins $Therapeutic Activity: 8-22 mins PT General Charges $$ ACUTE PT VISIT: 1 Visit                     D. Elly Modena PT, DPT 08/08/23, 4:26 PM

## 2023-08-08 NOTE — Progress Notes (Signed)
Occupational Therapy Treatment Patient Details Name: Sharon Mcclure MRN: [id number] DOB: December 21, 1940 Today's Date: 08/08/2023   History of present illness Patient is an 82 year old female who presented to ED with congestion and general malaise with worsening couch. Admitted for acute respiratory failure hypoxia pneumonia. Patient had a fever in the beginning but has since resolved. PMH includes CAD, GERD, HTN, HLD   OT comments  Pt seen for OT tx. Pt denies SOB throughout session, noting she has recently been ambulating to/from the bathroom with the RW and just removes the nasal cannula. On 6L O2 at rest, RN cleared to attempt wean to 4L. On 4L EOB after donning socks SpO2 87-88% and requires >94min + PLB to recover to 90-91% on 4L. Placed on 6L for mobility to/from bathroom for toileting with SpO2 87-88% with mobility, requiring >8min + PLB to improve to 90-91% on 6L. Once returned to bed, pt tolerates 5L O2 with SpO2 90-91%. RN notified. Pt/family educated in need for supplemental O2 at this time due to drop in SpO2 with exertion. Pt educated in ECS and PLB to support ADL/mobility safety. Pt verbalized understanding. Pt requires SBA-CGA for mobility to/from the bathroom, SBA for toilet transfer, and SBA for standing grooming tasks. Progressing towards goals. Pt continues to benefit from skilled OT services.       If plan is discharge home, recommend the following:  A little help with walking and/or transfers;A little help with bathing/dressing/bathroom;Assistance with cooking/housework;Assist for transportation;Help with stairs or ramp for entrance   Equipment Recommendations  None recommended by OT    Recommendations for Other Services      Precautions / Restrictions Precautions Precautions: Fall Restrictions Weight Bearing Restrictions: No       Mobility Bed Mobility Overal bed mobility: Modified Independent             General bed mobility comments: incr time/effort     Transfers Overall transfer level: Needs assistance Equipment used: Rolling walker (2 wheels) Transfers: Sit to/from Stand Sit to Stand: Contact guard assist, Supervision           General transfer comment: CGA for initial from EOB and VC for hand placement, SBA from [facility] over toilet     Balance Overall balance assessment: Needs assistance Sitting-balance support: Feet supported Sitting balance-Leahy Scale: Good     Standing balance support: Bilateral upper extremity supported, Reliant on assistive device for balance, No upper extremity supported, During functional activity Standing balance-Leahy Scale: Fair Standing balance comment: tolerates static standing at sink for grooming, improved dynamic balance with RW                           ADL either performed or assessed with clinical judgement   ADL Overall ADL's : Needs assistance/impaired     Grooming: Standing;Supervision/safety;Wash/dry hands               Lower Body Dressing: Sit to/from stand;Contact guard assist   Toilet Transfer: Supervision/safety;Contact guard assist Toilet Transfer Details (indicate cue type and reason): SBA, BSC over toilet Toileting- Clothing Manipulation and Hygiene: Modified independent;Sitting/lateral lean       Functional mobility during ADLs: Contact guard assist;Supervision/safety;Rolling walker (2 wheels)      Extremity/Trunk Assessment              Vision       Perception     Praxis      Cognition Arousal: Alert Behavior During Therapy: [facility]  for tasks assessed/performed Overall Cognitive Status: Within Functional Limits for tasks assessed                                          Exercises Other Exercises Other Exercises: Pt educated in ECS and PLB to support ADL and mobility participation during toileting and in-room mobility today.    Shoulder Instructions       General Comments On 6L O2 at rest, RN cleared to attempt wean  to 4L. On 4L EOB after donning socks SpO2 87-88% and requires >85min + PLB to recover to 90-91% on 4L. Placed on 6L for mobility to/from bathroom for toileting with SpO2 87-88% with mobility, requiring >36min + PLB to improve to 90-91% on 6L. Once returned to bed, pt tolerates 5L O2 with SpO2 90-91%. RN notified.    Pertinent Vitals/ Pain       Pain Assessment Pain Assessment: No/denies pain  Home Living                                          Prior Functioning/Environment              Frequency  Min 1X/week        Progress Toward Goals  OT Goals(current goals can now be found in the care plan section)  Progress towards OT goals: Progressing toward goals  Acute Rehab OT Goals Patient Stated Goal: go home and feel better OT Goal Formulation: With patient/family Time For Goal Achievement: 08/20/23 Potential to Achieve Goals: Good  Plan      Co-evaluation                 AM-PAC OT "6 Clicks" Daily Activity     Outcome Measure   Help from another person eating meals?: None Help from another person taking care of personal grooming?: None Help from another person toileting, which includes using toliet, bedpan, or urinal?: A Little Help from another person bathing (including washing, rinsing, drying)?: A Little Help from another person to put on and taking off regular upper body clothing?: None Help from another person to put on and taking off regular lower body clothing?: A Little 6 Click Score: 21    End of Session Equipment Utilized During Treatment: Rolling walker (2 wheels);Oxygen  OT Visit Diagnosis: Other abnormalities of gait and mobility (R26.89)   Activity Tolerance Patient tolerated treatment well   Patient Left in bed;with call bell/phone within reach;with bed alarm set;with family/visitor present   Nurse Communication Mobility status;Other (comment) (O2)        Time: 4098-1191 OT Time Calculation (min): 27 min  Charges: OT  General Charges $OT Visit: 1 Visit OT Treatments $Self Care/Home Management : 23-37 mins  Arman Filter., MPH, MS, OTR/L ascom [phone] 08/08/23, 12:24 PM

## 2023-08-08 NOTE — Progress Notes (Signed)
PHARMACY CONSULT NOTE - ELECTROLYTES  Pharmacy Consult for Electrolyte Monitoring and Replacement   Recent Labs: Height: 5\' 1"  (154.9 cm) Weight: 72.6 kg (160 lb) IBW/kg (Calculated) : 47.8 Estimated Creatinine Clearance: 51.1 mL/min (by C-G formula based on SCr of 0.52 mg/dL). Potassium (mmol/L)  Date Value  08/08/2023 3.7  04/01/2012 3.6   Magnesium (mg/dL)  Date Value  16/05/9603 2.0   Calcium (mg/dL)  Date Value  54/04/8118 8.3 (L)   Calcium, Total (mg/dL)  Date Value  14/78/2956 9.4   Albumin (g/dL)  Date Value  21/30/8657 2.1 (L)  04/01/2012 3.9   Phosphorus (mg/dL)  Date Value  84/69/6295 3.0   Sodium (mmol/L)  Date Value  08/08/2023 138  04/01/2012 140   Corrected Ca:    9.6  mg/dL       Assessment  Sharon Mcclure is a 82 y.o. female presenting with PNA. PMH significant for HTN, HLD, CAD, GERD. Pharmacy has been consulted to monitor and replace electrolytes.  Diet: reg/heart healthy MIVF: IV lock Pertinent medications:    Goal of Therapy: Electrolytes WNL  Plan:  No electrolytes replacement needed today Will follow BMP in with AM   Thank you for allowing pharmacy to be a part of this patient's care.  Sande Pickert Rodriguez-Guzman PharmD, BCPS 08/08/2023 7:15 AM

## 2023-08-08 NOTE — Plan of Care (Signed)

## 2023-08-08 NOTE — Progress Notes (Signed)
Progress Note   Patient: Sharon Mcclure ZOX:[id number] DOB: October 08, 1940 DOA: 08/05/2023     3 DOS: the patient was seen and examined on 08/08/2023    Brief hospital course: "Sharon Mcclure is a 82 y.o. female with medical history significant of CAD, GERD, hypertension, hyperlipidemia presented with acute respiratory failure hypoxia, pneumonia.  Patient reports sudden onset of progressive shortness of breath cough, malaise over the past 12 to 24 hours.  No recent sick contacts.  No chest pain.  Positive nausea and decreased p.o. intake.  No belly pain or diarrhea.  No focal hemiparesis or confusion.  Non-smoker.  Does report remote history of symptoms like this roughly 6 years ago.  Status post pneumonia shot per the daughter.  No recent sick contacts. Presented to the ER afebrile, hemodynamically stable.  Satting in the mid 80s on room air.  Transition to 2 L nasal cannula to keep O2 sats greater than 94%.  White count 23, hemoglobin 12, platelets 322, COVID flu and RSV negative.  Creatinine 0.45, glucose 119.  CTA chest with multifocal pneumonia as well as potential atypical pattern.  Patient had worsening respiratory function and was transitioned to high flow.  Pulmonologist consulted." See H&P for full HPI on admission & ED course.  Further hospital course and management as outlined below.    Assessment and Plan:   Acute respiratory failure with hypoxia (HCC) Chest CT scan showing bilateral multilobar pneumonia Continue Rocephin, Zithromax Continue supplemental oxygen Currently requiring high flow nasal oxygen & wean as tolerated, goal spO2 > 90% Pulmonology consulted, has signed off - appreciate input Continue incentive spirometry   Bilateral pneumonia Continue management as above Follow-up on blood and respiratory cultures Follow-up on urine Legionella, strep Mycoplasma, as well as respiratory panel Monitor saturation closely Incentive spirometer and flutter valve    Prediabetes Blood sugar in 110s Continue to monitor sugars closely   Mood disorder (HCC) Continue Celexa   History of stroke Remote history of CVA evaluation June 2022 Thought to be secondary to [facility] gummy side effects Nonfocal neuroexam    Coronary artery disease involving native coronary artery of native heart without angina pectoris Baseline history of CAD with noted calcifications on prior cardiac imaging No active chest pain Continue home regimen Pt reports no longer taking aspirin   GERD (gastroesophageal reflux disease) Continue PPI   Essential hypertension BP stable Titrate home regimen      Advance Care Planning:   Code Status: Full Code    Consults: Pulmonology   Family Communication: Daughter and friend at the bedside      Subjective:  Patient seen with family visiting this AM.  She hopes to go home tomorrow.  Discussed needing to wean oxygen down further.  She reports productive cough, some difficulty coughing up phlegm.  No other acute complaints.      Physical Exam: General exam: awake, alert, no acute distress HEENT: atraumatic, clear conjunctiva, anicteric sclera, moist mucus membranes, hearing grossly normal  Respiratory system: CTAB generally diminished, no wheezes or rhonchi, normal respiratory effort at rest on 6 L/min HFNC O2. Cardiovascular system: normal S1/S2,  RRR, no JVD, murmurs, rubs, gallops, no pedal edema.   Gastrointestinal system: soft, NT, ND, no HSM felt, +bowel sounds. Central nervous system: A&O x 3. no gross focal neurologic deficits, normal speech Extremities: moves all, no edema, normal tone Skin: dry, intact, normal temperature Psychiatry: normal mood, congruent affect, judgement and insight appear normal        Disposition: Status  is: Inpatient Remains inpatient appropriate due to ongoing need for significant supplemental oxygen, needs further weaning down of O2 requirement prior to discharge. .     Data Reviewed: I  have reviewed patient chest ct showing bilateral pneumonia    Latest Ref Rng & Units 08/08/2023    4:52 AM 08/07/2023    4:22 AM 08/06/2023    6:26 AM  CBC  WBC 4.0 - 10.5 K/uL 15.8  18.8  23.0   Hemoglobin 12.0 - 15.0 g/dL 16.1  09.6  04.5   Hematocrit 36.0 - 46.0 % 31.9  33.7  33.1   Platelets 150 - 400 K/uL 371  322  353        Latest Ref Rng & Units 08/08/2023    4:52 AM 08/07/2023    4:22 AM 08/06/2023    6:26 AM  BMP  Glucose 70 - 99 mg/dL 409  811  99   BUN 8 - 23 mg/dL 13  13  12    Creatinine 0.44 - 1.00 mg/dL 9.14  7.82  9.56   Sodium 135 - 145 mmol/L 138  136  136   Potassium 3.5 - 5.1 mmol/L 3.7  4.1  3.3   Chloride 98 - 111 mmol/L 96  96  97   CO2 22 - 32 mmol/L 32  32  27   Calcium 8.9 - 10.3 mg/dL 8.3  8.1  8.1     Vitals:   08/08/23 0022 08/08/23 0350 08/08/23 0813 08/08/23 1240  BP: 129/65 126/72 126/65 123/69  Pulse: 75 81 81 83  Resp: (!) 21 18  19   Temp: 97.8 F (36.6 C) 98.2 F (36.8 C) 97.9 F (36.6 C) 98.3 F (36.8 C)  TempSrc: Oral Oral Oral Oral  SpO2: 93% 95% 95% 96%  Weight:      Height:         Author: Pennie Banter, DO 08/08/2023 3:12 PM  For on call review [email or web address].

## 2023-08-09 DIAGNOSIS — J9601 Acute respiratory failure with hypoxia: Secondary | ICD-10-CM | POA: Diagnosis not present

## 2023-08-09 LAB — CBC WITH DIFFERENTIAL/PLATELET
Abs Immature Granulocytes: 0.29 10*3/uL — ABNORMAL HIGH (ref 0.00–0.07)
Basophils Absolute: 0 10*3/uL (ref 0.0–0.1)
Basophils Relative: 0 %
Eosinophils Absolute: 0 10*3/uL (ref 0.0–0.5)
Eosinophils Relative: 0 %
HCT: 36 % (ref 36.0–46.0)
Hemoglobin: 11.9 g/dL — ABNORMAL LOW (ref 12.0–15.0)
Immature Granulocytes: 2 %
Lymphocytes Relative: 8 %
Lymphs Abs: 1.2 10*3/uL (ref 0.7–4.0)
MCH: 27.4 pg (ref 26.0–34.0)
MCHC: 33.1 g/dL (ref 30.0–36.0)
MCV: 82.8 fL (ref 80.0–100.0)
Monocytes Absolute: 1.1 10*3/uL — ABNORMAL HIGH (ref 0.1–1.0)
Monocytes Relative: 7 %
Neutro Abs: 11.7 10*3/uL — ABNORMAL HIGH (ref 1.7–7.7)
Neutrophils Relative %: 83 %
Platelets: 447 10*3/uL — ABNORMAL HIGH (ref 150–400)
RBC: 4.35 MIL/uL (ref 3.87–5.11)
RDW: 14.6 % (ref 11.5–15.5)
WBC: 14.4 10*3/uL — ABNORMAL HIGH (ref 4.0–10.5)
nRBC: 0 % (ref 0.0–0.2)

## 2023-08-09 LAB — BASIC METABOLIC PANEL
Anion gap: 11 (ref 5–15)
BUN: 10 mg/dL (ref 8–23)
CO2: 33 mmol/L — ABNORMAL HIGH (ref 22–32)
Calcium: 8.3 mg/dL — ABNORMAL LOW (ref 8.9–10.3)
Chloride: 94 mmol/L — ABNORMAL LOW (ref 98–111)
Creatinine, Ser: 0.46 mg/dL (ref 0.44–1.00)
GFR, Estimated: >60 mL/min (ref >60)
Glucose, Bld: 105 mg/dL — ABNORMAL HIGH (ref 70–99)
Potassium: 3.1 mmol/L — ABNORMAL LOW (ref 3.5–5.1)
Sodium: 138 mmol/L (ref 135–145)

## 2023-08-09 LAB — LEGIONELLA PNEUMOPHILA SEROGP 1 UR AG: L. pneumophila Serogp 1 Ur Ag: NEGATIVE

## 2023-08-09 LAB — MAGNESIUM: Magnesium: 2.2 mg/dL (ref 1.7–2.4)

## 2023-08-09 MED ORDER — AZITHROMYCIN 250 MG PO TABS
500.0000 mg | ORAL_TABLET | Freq: Every day | ORAL | Status: DC
  Administered 2023-08-09: 500 mg via ORAL
  Filled 2023-08-09 (×2): qty 2

## 2023-08-09 MED ORDER — ACETAMINOPHEN 325 MG PO TABS: 650.0000 mg | ORAL_TABLET | Freq: Four times a day (QID) | ORAL | Status: DC | PRN

## 2023-08-09 MED ORDER — TRAMADOL HCL 50 MG PO TABS
50.0000 mg | ORAL_TABLET | Freq: Four times a day (QID) | ORAL | Status: DC | PRN
  Administered 2023-08-09 (×2): 50 mg via ORAL
  Filled 2023-08-09 (×2): qty 1

## 2023-08-09 MED ORDER — IPRATROPIUM-ALBUTEROL 0.5-2.5 (3) MG/3ML IN SOLN: 3.0000 mL | Freq: Four times a day (QID) | RESPIRATORY_TRACT | Status: DC | PRN

## 2023-08-09 MED ORDER — CALCIUM CARBONATE ANTACID 500 MG PO CHEW: 1.0000 | CHEWABLE_TABLET | Freq: Two times a day (BID) | ORAL | Status: DC | PRN

## 2023-08-09 MED ORDER — POTASSIUM CHLORIDE CRYS ER 20 MEQ PO TBCR
40.0000 meq | EXTENDED_RELEASE_TABLET | Freq: Two times a day (BID) | ORAL | Status: AC
  Administered 2023-08-09 (×2): 40 meq via ORAL
  Filled 2023-08-09 (×2): qty 2

## 2023-08-09 NOTE — Progress Notes (Signed)
PHARMACY CONSULT NOTE - ELECTROLYTES  Pharmacy Consult for Electrolyte Monitoring and Replacement   Recent Labs: Height: 5\' 1"  (154.9 cm) Weight: 72.6 kg (160 lb) IBW/kg (Calculated) : 47.8 Estimated Creatinine Clearance: 51.1 mL/min (by C-G formula based on SCr of 0.46 mg/dL). Potassium (mmol/L)  Date Value  08/09/2023 3.1 (L)  04/01/2012 3.6   Magnesium (mg/dL)  Date Value  02/72/5366 2.2   Calcium (mg/dL)  Date Value  44/10/4740 8.3 (L)   Calcium, Total (mg/dL)  Date Value  59/56/3875 9.4   Albumin (g/dL)  Date Value  64/33/2951 2.1 (L)  04/01/2012 3.9   Phosphorus (mg/dL)  Date Value  88/41/6606 3.0   Sodium (mmol/L)  Date Value  08/09/2023 138  04/01/2012 140   Corrected Ca:    9.6  mg/dL       Assessment  Sharon Mcclure is a 82 y.o. female presenting with PNA. PMH significant for HTN, HLD, CAD, GERD. Pharmacy has been consulted to monitor and replace electrolytes.  Diet: reg/heart healthy MIVF: IV lock Pertinent medications:    Goal of Therapy: Electrolytes WNL  Plan:  K=3.1, will replace with Kcl po BID x 2 doses today. Will follow BMP in with AM labs.  Thank you for allowing pharmacy to be a part of this patient's care.  Jozsef Wescoat Rodriguez-Guzman PharmD, BCPS 08/09/2023 7:19 AM

## 2023-08-09 NOTE — Progress Notes (Signed)
Progress Note   Patient: Sharon Mcclure:[id number] DOB: 1941/05/30 DOA: 08/05/2023     4 DOS: the patient was seen and examined on 08/09/2023    Brief hospital course: "Sharon Mcclure is a 82 y.o. female with medical history significant of CAD, GERD, hypertension, hyperlipidemia presented with acute respiratory failure hypoxia, pneumonia.  Patient reports sudden onset of progressive shortness of breath cough, malaise over the past 12 to 24 hours.  No recent sick contacts.  No chest pain.  Positive nausea and decreased p.o. intake.  No belly pain or diarrhea.  No focal hemiparesis or confusion.  Non-smoker.  Does report remote history of symptoms like this roughly 6 years ago.  Status post pneumonia shot per the daughter.  No recent sick contacts. Presented to the ER afebrile, hemodynamically stable.  Satting in the mid 80s on room air.  Transition to 2 L nasal cannula to keep O2 sats greater than 94%.  White count 23, hemoglobin 12, platelets 322, COVID flu and RSV negative.  Creatinine 0.45, glucose 119.  CTA chest with multifocal pneumonia as well as potential atypical pattern.  Patient had worsening respiratory function and was transitioned to high flow.  Pulmonologist consulted." See H&P for full HPI on admission & ED course.  Further hospital course and management as outlined below.    Assessment and Plan:   Acute respiratory failure with hypoxia (HCC) 12/12 - weaned from 6 L >> 4.5 L/min oxygen from yesterday to today Chest CT scan showing bilateral multilobar pneumonia --Check ambulatory O2 sats --Continue Rocephin, Zithromax --Continue supplemental oxygen --Currently requiring high flow nasal oxygen & wean as tolerated, goal spO2 > 90% --Pulmonology consulted, has signed off - appreciate input --Continue incentive spirometry & flutter valve   Bilateral pneumonia MRSA not detected, negative respiratory viral panel, negative Covid/flu/rsv --Continue management as  above --Follow-up on blood and sputum cultures - negative to date --Incentive spirometer and flutter valve   Prediabetes Blood sugar in 110s Continue to monitor sugars closely   Mood disorder (HCC) Continue Celexa   History of stroke Remote history of CVA evaluation June 2022 Thought to be secondary to [facility] gummy side effects Nonfocal neuroexam    Coronary artery disease involving native coronary artery of native heart without angina pectoris Baseline history of CAD with noted calcifications on prior cardiac imaging No active chest pain Continue home regimen Pt reports no longer taking aspirin   GERD (gastroesophageal reflux disease) Continue PPI   Essential hypertension BP stable Titrate home regimen      Advance Care Planning:   Code Status: Full Code    Consults: Pulmonology   Family Communication: Daughter at the bedside      Subjective:  Patient seen with daughter visiting this AM.  She reports ongoing shortness of breath and cough with some difficulty clearing phlegm from her throat.  No fever/chills.  Was hopeful to d/c home today but understands need for further weaning down on oxygen.      Physical Exam: General exam: awake, alert, no acute distress, appears fatigued  HEENT: moist mucus membranes, hearing grossly normal  Respiratory system: CTAB generally diminished, no wheezes or rhonchi, normal respiratory effort at rest on 4.5 L/min HFNC O2 (down from 6 L). Cardiovascular system: normal S1/S2,  RRR, no pedal edema.   Gastrointestinal system: soft, NT, ND Central nervous system: A&O x 3. no gross focal neurologic deficits, normal speech Extremities: moves all, no edema, normal tone Skin: dry, intact, normal temperature Psychiatry: normal mood,  congruent affect, judgement and insight appear normal        Disposition: Status is: Inpatient Remains inpatient appropriate due to ongoing need for significant supplemental oxygen, needs further weaning down  of O2 requirement prior to discharge. .     Data Reviewed: I have reviewed patient chest ct showing bilateral pneumonia    Latest Ref Rng & Units 08/09/2023    5:30 AM 08/08/2023    4:52 AM 08/07/2023    4:22 AM  CBC  WBC 4.0 - 10.5 K/uL 14.4  15.8  18.8   Hemoglobin 12.0 - 15.0 g/dL 41.3  24.4  01.0   Hematocrit 36.0 - 46.0 % 36.0  31.9  33.7   Platelets 150 - 400 K/uL 447  371  322        Latest Ref Rng & Units 08/09/2023    5:30 AM 08/08/2023    4:52 AM 08/07/2023    4:22 AM  BMP  Glucose 70 - 99 mg/dL 272  536  644   BUN 8 - 23 mg/dL 10  13  13    Creatinine 0.44 - 1.00 mg/dL 0.34  7.42  5.95   Sodium 135 - 145 mmol/L 138  138  136   Potassium 3.5 - 5.1 mmol/L 3.1  3.7  4.1   Chloride 98 - 111 mmol/L 94  96  96   CO2 22 - 32 mmol/L 33  32  32   Calcium 8.9 - 10.3 mg/dL 8.3  8.3  8.1     Vitals:   08/08/23 2330 08/09/23 0510 08/09/23 0810 08/09/23 1239  BP: 137/75 (!) 140/77 (!) 151/82 134/79  Pulse: 82 83 87 84  Resp: 18 19 18  (!) 21  Temp: 98.3 F (36.8 C) 97.7 F (36.5 C) 98.2 F (36.8 C) 97.6 F (36.4 C)  TempSrc:  Oral Oral Oral  SpO2: 92% 92% 92% 97%  Weight:      Height:         Author: Pennie Banter, DO 08/09/2023 1:49 PM  For on call review [email or web address].

## 2023-08-09 NOTE — Plan of Care (Signed)

## 2023-08-09 NOTE — Progress Notes (Signed)
This RN ambulated patient on 4L O2 via nasal cannula. Pt's oxygen level dropped to 80%. Pt returned to room and sat in chair initiating deep breaths. Pt placed on 4.5L of O2 and oxygen level returned to 95%.

## 2023-08-09 NOTE — Plan of Care (Signed)

## 2023-08-09 NOTE — Care Management Important Message (Signed)
Important Message  Patient Details  Name: MAJOR HAEGELE MRN: [id number] Date of Birth: [birth year]   Important Message Given:  Yes - Medicare IM     Sharon Mcclure A Khamauri Bauernfeind 08/09/2023, 2:02 PM

## 2023-08-10 DIAGNOSIS — J9601 Acute respiratory failure with hypoxia: Secondary | ICD-10-CM | POA: Diagnosis not present

## 2023-08-10 LAB — BASIC METABOLIC PANEL
Anion gap: 11 (ref 5–15)
BUN: 12 mg/dL (ref 8–23)
CO2: 29 mmol/L (ref 22–32)
Calcium: 8.5 mg/dL — ABNORMAL LOW (ref 8.9–10.3)
Chloride: 94 mmol/L — ABNORMAL LOW (ref 98–111)
Creatinine, Ser: 0.47 mg/dL (ref 0.44–1.00)
GFR, Estimated: >60 mL/min (ref >60)
Glucose, Bld: 101 mg/dL — ABNORMAL HIGH (ref 70–99)
Potassium: 4.5 mmol/L (ref 3.5–5.1)
Sodium: 134 mmol/L — ABNORMAL LOW (ref 135–145)

## 2023-08-10 LAB — CBC
HCT: 38.2 % (ref 36.0–46.0)
Hemoglobin: 12.3 g/dL (ref 12.0–15.0)
MCH: 26.8 pg (ref 26.0–34.0)
MCHC: 32.2 g/dL (ref 30.0–36.0)
MCV: 83.2 fL (ref 80.0–100.0)
Platelets: 511 10*3/uL — ABNORMAL HIGH (ref 150–400)
RBC: 4.59 MIL/uL (ref 3.87–5.11)
RDW: 14.9 % (ref 11.5–15.5)
WBC: 13 10*3/uL — ABNORMAL HIGH (ref 4.0–10.5)
nRBC: 0 % (ref 0.0–0.2)

## 2023-08-10 MED ORDER — DM-GUAIFENESIN ER 30-600 MG PO TB12: 1.0000 | ORAL_TABLET | Freq: Two times a day (BID) | ORAL | 0 refills | Status: AC

## 2023-08-10 MED ORDER — GUAIFENESIN-DM 100-10 MG/5ML PO SYRP: 5.0000 mL | ORAL_SOLUTION | ORAL | Status: DC | PRN

## 2023-08-10 MED ORDER — HYDROCOD POLI-CHLORPHE POLI ER 10-8 MG/5ML PO SUER: 5.0000 mL | Freq: Every evening | ORAL | 0 refills | Status: DC | PRN

## 2023-08-10 MED ORDER — AZITHROMYCIN 250 MG PO TABS
500.0000 mg | ORAL_TABLET | Freq: Every day | ORAL | Status: AC
  Administered 2023-08-10: 500 mg via ORAL

## 2023-08-10 NOTE — TOC Transition Note (Addendum)
Transition of Care [facility]) - Discharge Note   Patient Details  Name: Sharon Mcclure MRN: [id number] Date of Birth: 09-20-1940  [facility] CM/SW Contact:  Truddie Hidden, RN Phone Number: 08/10/2023, 10:47 AM   Clinical Narrative:    Spoke with patients daughter Selena Batten regarding discharge home today. She was advised [facility] would be in contact with her to scheduled an appointment.. She was also advised oxygen was required and was requested via Mitch from [employer]. Oxygen will be delivered to the  room and remaining set up and supplies at patient's home. Selena Batten will transport patient home. Oxygen requested via Mitch from [employer].  Velna Hatchet from [place] notified of discharge today. Patient will be seen Tuesday. Patient's daughter notified.    TOC signing off           Patient Goals and CMS Choice            Discharge Placement                       Discharge Plan and Services Additional resources added to the After Visit Summary for                                       Social Drivers of Health (SDOH) Interventions SDOH Screenings   Food Insecurity: No Food Insecurity (08/06/2023)  Housing: Low Risk  (08/06/2023)  Transportation Needs: No Transportation Needs (08/06/2023)  Utilities: Not At Risk (08/06/2023)  Alcohol Screen: Low Risk  (06/11/2023)  Depression (PHQ2-9): Low Risk  (06/11/2023)  Financial Resource Strain: Low Risk  (06/11/2023)  Physical Activity: Inactive (06/11/2023)  Social Connections: Moderately Isolated (06/11/2023)  Stress: No Stress Concern Present (06/11/2023)  Tobacco Use: Medium Risk (08/06/2023)  Health Literacy: Adequate Health Literacy (06/11/2023)     Readmission Risk Interventions    08/06/2023   10:55 AM  Readmission Risk Prevention Plan  Post Dischage Appt Complete  Medication Screening Complete  Transportation Screening Complete

## 2023-08-10 NOTE — Progress Notes (Signed)
Physical Therapy Treatment Patient Details Name: Sharon Mcclure MRN: [id number] DOB: [birth year] Today's Date: 08/10/2023   History of Present Illness Patient is an 82 year old female who presented to ED with congestion and general malaise with worsening couch. Admitted for acute respiratory failure hypoxia pneumonia. Patient had a fever in the beginning but has since resolved. PMH includes CAD, GERD, HTN, HLD    PT Comments  Pt was pleasant and motivated to participate during the session and put forth good effort throughout. Pt found on 3.5LO2/min with SpO2 94% and HR WNL.  Pt demonstrated good carryover of proper sequencing during transfer training from various height surfaces with both a RW and SPC.  Pt continued to ambulate slowly but was generally steady with no overt LOB with both a RW and SPC.  Pt able to ascend and descend 4 steps with SPC in one hand and HHA in the other with slow, step-to cadence but generally steady with no overt LOB and with minimal lean on the HHA side.  Pt's SpO2 on 4L dropped to a low of 89% but upon return to sitting quickly increased back to the low 90s.  Pt will benefit from continued PT services upon discharge to safely address deficits listed in patient problem list for decreased caregiver assistance and eventual return to PLOF.      If plan is discharge home, recommend the following: A little help with walking and/or transfers;Assistance with cooking/housework;Assist for transportation;Help with stairs or ramp for entrance   Can travel by private vehicle        Equipment Recommendations  Rolling walker (2 wheels)    Recommendations for Other Services       Precautions / Restrictions Precautions Precautions: Fall Restrictions Weight Bearing Restrictions Per Provider Order: No Other Position/Activity Restrictions: Watch SpO2     Mobility  Bed Mobility Overal bed mobility: Modified Independent             General bed mobility comments: Min  extra time and effort only    Transfers Overall transfer level: Needs assistance Equipment used: Rolling walker (2 wheels), Straight cane Transfers: Sit to/from Stand Sit to Stand: Supervision           General transfer comment: Good control and stability from various height surfaces with training on transfers with a RW as well as a SPC    Ambulation/Gait Ambulation/Gait assistance: Contact guard assist, Supervision Gait Distance (Feet): 40 Feet with combination of SPC and RW Assistive device: Rolling walker (2 wheels), Straight cane Gait Pattern/deviations: Decreased step length - right, Decreased step length - left, Step-through pattern Gait velocity: decreased     General Gait Details: Slow cadence with short B step length but steady without LOB both with a RW and a SPC   Stairs Stairs: Yes Stairs assistance: Contact guard assist Stair Management: With cane, Step to pattern, Forwards Number of Stairs: 4 General stair comments: Pt able to ascend and descend 4 steps with SPC in one hand and HHA in the other with slow, step-to cadence but generally steady with no overt LOB and with minimal lean on the HHA side   Wheelchair Mobility     Tilt Bed    Modified Rankin (Stroke Patients Only)       Balance Overall balance assessment: Needs assistance Sitting-balance support: Feet supported Sitting balance-Leahy Scale: Normal     Standing balance support: Bilateral upper extremity supported, During functional activity, Single extremity supported Standing balance-Leahy Scale: Good  Cognition Arousal: Alert Behavior During Therapy: WFL for tasks assessed/performed Overall Cognitive Status: Within Functional Limits for tasks assessed                                          Exercises Other Exercises Other Exercises: Pt education on physiological benefits of out of bed to chair    General Comments         Pertinent Vitals/Pain Pain Assessment Pain Assessment: No/denies pain    Home Living                          Prior Function            PT Goals (current goals can now be found in the care plan section) Progress towards PT goals: Progressing toward goals    Frequency    Min 1X/week      PT Plan      Co-evaluation              AM-PAC PT "6 Clicks" Mobility   Outcome Measure  Help needed turning from your back to your side while in a flat bed without using bedrails?: None Help needed moving from lying on your back to sitting on the side of a flat bed without using bedrails?: None Help needed moving to and from a bed to a chair (including a wheelchair)?: A Little Help needed standing up from a chair using your arms (e.g., wheelchair or bedside chair)?: A Little Help needed to walk in hospital room?: A Little Help needed climbing 3-5 steps with a railing? : A Little 6 Click Score: 20    End of Session Equipment Utilized During Treatment: Gait belt;Oxygen Activity Tolerance: Patient tolerated treatment well Patient left: in chair;with call bell/phone within reach;with family/visitor present Nurse Communication: Mobility status;Other (comment) (pt declined chair alarm, family stated they will be with pt until discharge) PT Visit Diagnosis: Unsteadiness on feet (R26.81);Other abnormalities of gait and mobility (R26.89);Muscle weakness (generalized) (M62.81)     Time: 6213-0865 PT Time Calculation (min) (ACUTE ONLY): 29 min  Charges:    $Gait Training: 23-37 mins PT General Charges $$ ACUTE PT VISIT: 1 Visit                     D. Scott Korin Hartwell PT, DPT 08/10/23, 11:35 AM

## 2023-08-10 NOTE — Discharge Summary (Addendum)
Physician Discharge Summary   Patient: Sharon Mcclure MRN: [id number] DOB: [birth year]  Admit date:     08/05/2023  Discharge date: 08/10/2023  Discharge Physician: Pennie Banter   PCP: Dana Allan, MD   Recommendations at discharge:   Follow up with Primary Care in 1-2 weeks  Repeat CBC, BMP, Mg at follow up Follow up patient's oxygen requirement and weaning off as recovering from pneumonia  Discharge Diagnoses: Principal Problem:   Acute respiratory failure with hypoxia (HCC) Active Problems:   Pneumonia   Essential hypertension   GERD (gastroesophageal reflux disease)   Coronary artery disease involving native coronary artery of native heart without angina pectoris   History of stroke   Mood disorder (HCC)   Prediabetes   Acute hypoxic respiratory failure (HCC)  Resolved Problems:   * No resolved hospital problems. *  Hospital Course: "Sharon Mcclure is a 82 y.o. female with medical history significant of CAD, GERD, hypertension, hyperlipidemia presented with acute respiratory failure hypoxia, pneumonia.  Patient reports sudden onset of progressive shortness of breath cough, malaise over the past 12 to 24 hours.  No recent sick contacts.  No chest pain.  Positive nausea and decreased p.o. intake.  No belly pain or diarrhea.  No focal hemiparesis or confusion.  Non-smoker.  Does report remote history of symptoms like this roughly 6 years ago.  Status post pneumonia shot per the daughter.  No recent sick contacts. Presented to the ER afebrile, hemodynamically stable.  Satting in the mid 80s on room air.  Transition to 2 L nasal cannula to keep O2 sats greater than 94%.  White count 23, hemoglobin 12, platelets 322, COVID flu and RSV negative.  Creatinine 0.45, glucose 119.  CTA chest with multifocal pneumonia as well as potential atypical pattern.  Patient had worsening respiratory function and was transitioned to high flow.  Pulmonologist consulted." See H&P for full HPI on  admission & ED course.   Further hospital course and management as outlined below.   08/10/23 -- pt feels better today, less short of breath since using IS and flutter is helping to cough up phlegm.  Oxygen reduced to 3 L/min and during ambulation, maintains adequate O2 sats.  Pt is clinically improved and medically stable, she and daughter agreeable to discharge home with home health today.   Assessment and Plan:  Acute respiratory failure with hypoxia - Improving 12/12 - weaned from 6 L >> 4.5 L/min oxygen from yesterday to today 12/13 - stable on 3 L/min at rest and with ambulation Chest CT scan showing bilateral multilobar pneumonia --Home oxygen ordered --Completed antibiotics with Rocephin, Zithromax --Continue supplemental oxygen -- 3 L/min and continue to wean as tolerated, goal spO2 > 90% --Pulmonology consulted, has signed off - appreciate input --Continue incentive spirometry & flutter valve --Mucinex and/or Tussionex PRN   Bilateral pneumonia MRSA not detected, negative respiratory viral panel, negative Covid/flu/rsv --Continue management as above --Follow-up on blood and sputum cultures - negative to date --Incentive spirometer and flutter valve  Hypokalemia - K was replaced --Repeat BMP at follow up   Prediabetes Blood sugar in 110s Continue to monitor sugars closely   Mood disorder (HCC) Continue Celexa   History of stroke Remote history of CVA evaluation June 2022 Thought to be secondary to [facility] gummy side effects Nonfocal neuroexam    Coronary artery disease involving native coronary artery of native heart without angina pectoris Baseline history of CAD with noted calcifications on prior cardiac imaging No active  chest pain Continue home regimen Pt reports no longer taking aspirin    GERD (gastroesophageal reflux disease) Continue PPI   Essential hypertension BP stable Titrate home regimen         Consultants: Pulmonology Procedures  performed: None  Disposition: Home health Diet recommendation:  Discharge Diet Orders (From admission, onward)     Start     Ordered   08/10/23 0000  Diet - low sodium heart healthy        08/10/23 1050            DISCHARGE MEDICATION: Allergies as of 08/10/2023       Reactions   Ampicillin Hives, Shortness Of Breath, Swelling   Has patient had a PCN reaction causing immediate rash, facial/tongue/throat swelling, SOB or lightheadedness with hypotension: Yes Has patient had a PCN reaction causing severe rash involving mucus membranes or skin necrosis: No Has patient had a PCN reaction that required hospitalization No Has patient had a PCN reaction occurring within the last 10 years: No If all of the above answers are "NO", then may proceed with Cephalosporin use.   Penicillins Hives, Shortness Of Breath   Has patient had a PCN reaction causing immediate rash, facial/tongue/throat swelling, SOB or lightheadedness with hypotension: Yes Has patient had a PCN reaction causing severe rash involving mucus membranes or skin necrosis: No Has patient had a PCN reaction that required hospitalization No Has patient had a PCN reaction occurring within the last 10 years: No If all of the above answers are "NO", then may proceed with Cephalosporin use.   Crestor [rosuvastatin Calcium]    Burning sensation   Ezetimibe-simvastatin    Niaspan [niacin Er (antihyperlipidemic)]    Fatigues, aches.   Vytorin [ezetimibe-simvastatin]    Zetia [ezetimibe]    Zocor [simvastatin]    Fenofibrate    Constipation and confusion   Welchol [colesevelam Hcl] Other (See Comments)   Severe bloating even on low dose        Medication List     TAKE these medications    amLODipine 5 MG tablet Commonly known as: NORVASC TAKE ONE TABLET BY MOUTH ONCE A DAY   chlorpheniramine-HYDROcodone 10-8 MG/5ML Commonly known as: TUSSIONEX Take 5 mLs by mouth at bedtime as needed for cough.   citalopram 10 MG  tablet Commonly known as: CELEXA Take 1 tablet (10 mg total) by mouth daily.   Fruit & Vegetable Daily Caps Take by mouth daily.   guaiFENesin-dextromethorphan 100-10 MG/5ML syrup Commonly known as: ROBITUSSIN DM Take 5 mLs by mouth every 4 (four) hours as needed for cough (Daytime cough).   dextromethorphan-guaiFENesin 30-600 MG 12hr tablet Commonly known as: MUCINEX DM Take 1 tablet by mouth 2 (two) times daily for 10 days.   metroNIDAZOLE 0.75 % gel Commonly known as: METROGEL Apply 1 Application topically 2 (two) times daily.   multivitamin capsule Take 1 capsule by mouth daily.   pantoprazole 40 MG tablet Commonly known as: PROTONIX Take 40 mg by mouth 2 (two) times daily before a meal.   traZODone 50 MG tablet Commonly known as: DESYREL TAKE ONE TABLET BY MOUTH EVERY NIGHT AT BEDTIME FOR SLEEP               Durable Medical Equipment  (From admission, onward)           Start     Ordered   08/10/23 1017  For home use only DME oxygen  Once       Question Answer Comment  Length of Need 6 Months   Mode or (Route) Nasal cannula   Liters per Minute 3   Frequency Continuous (stationary and portable oxygen unit needed)   Oxygen delivery system Gas      08/10/23 1016   08/07/23 1616  For home use only DME Walker rolling  Once       Question Answer Comment  Walker: With 5 Inch Wheels   Patient needs a walker to treat with the following condition Ambulatory dysfunction      08/07/23 1615            Discharge Exam: Filed Weights   08/05/23 0535  Weight: 72.6 kg   General exam: awake, alert, no acute distress HEENT: atraumatic, clear conjunctiva, anicteric sclera, moist mucus membranes, hearing grossly normal  Respiratory system: CTAB with improved aeration, no wheezes, rales or rhonchi, normal respiratory effort at rest on 3 L/min [place] o2 Cardiovascular system: normal S1/S2, RRR, no JVD, murmurs, rubs, gallops, no pedal edema.   Gastrointestinal  system: soft, NT, ND, no HSM felt, +bowel sounds. Central nervous system: A&O x 3. no gross focal neurologic deficits, normal speech Extremities: moves all, no edema, normal tone Skin: dry, intact, normal temperature, normal color, No rashes, lesions or ulcers Psychiatry: normal mood, congruent affect, judgement and insight appear normal   Condition at discharge: stable  The results of significant diagnostics from this hospitalization (including imaging, microbiology, ancillary and laboratory) are listed below for reference.   Imaging Studies: CT Chest Wo Contrast Result Date: 08/05/2023 CLINICAL DATA:  Productive cough. EXAM: CT CHEST WITHOUT CONTRAST TECHNIQUE: Multidetector CT imaging of the chest was performed following the standard protocol without IV contrast. RADIATION DOSE REDUCTION: This exam was performed according to the departmental dose-optimization program which includes automated exposure control, adjustment of the mA and/or kV according to patient size and/or use of iterative reconstruction technique. COMPARISON:  Chest x-ray earlier same day.  Chest CT 06/11/2020 FINDINGS: Cardiovascular: The heart size is normal. No substantial pericardial effusion. Coronary artery calcification is evident. Mitral annular calcification evident. Aortic valve calcification noted. Mild atherosclerotic calcification is noted in the wall of the thoracic aorta. Mediastinum/Nodes: Mild mediastinal lymphadenopathy evident including 12 mm short axis precarinal lymph node and 15 mm short axis subcarinal lymph node. Index AP window lymph node measures 15 mm short axis on 47/2. No evidence for gross hilar lymphadenopathy although assessment is limited by the lack of intravenous contrast on the current study. Moderate hiatal hernia. The esophagus has normal imaging features. There is no axillary lymphadenopathy. Lungs/Pleura: Patchy areas of ground-glass and consolidative airspace disease are noted in all lobes of  both lungs. Upper lung disease shows mixed attenuation with associated septal thickening. Scattered areas of mild bronchiectasis evident with relatively diffuse tree-in-bud nodularity and bronchial wall thickening in both lower lobes. No pleural effusion. Upper Abdomen: Visualized portion of the upper abdomen shows no acute findings. Musculoskeletal: No worrisome lytic or sclerotic osseous abnormality. IMPRESSION: 1. Patchy areas of ground-glass and consolidative airspace disease in all lobes of both lungs. Upper lung disease shows mixed attenuation with associated septal thickening. Imaging features are compatible with multifocal pneumonia. 2. Scattered areas of mild bronchiectasis with relatively diffuse tree-in-bud nodularity and bronchial wall thickening in both lower lobes. Imaging features are compatible with atypical infection. 3. Mild mediastinal lymphadenopathy, likely reactive. Follow-up CT in 3 months recommended. 4. Moderate hiatal hernia. 5.  Aortic Atherosclerosis (ICD10-I70.0). Electronically Signed   By: Kennith Center M.D.   On: 08/05/2023 08:06  DG Chest 2 View Result Date: 08/05/2023 CLINICAL DATA:  Productive cough. EXAM: CHEST - 2 VIEW COMPARISON:  06/11/2020 FINDINGS: Focal opacity in the lateral right mid lung has an almost masslike quality. Atelectasis or infiltrate noted at the left base. Interstitial markings are diffusely coarsened with chronic features. Asymmetric elevation right hemidiaphragm Cardiopericardial silhouette is at upper limits of normal for size. No acute bony abnormality. IMPRESSION: 1. Focal opacity in the lateral right mid lung has an almost masslike quality. While this may be pneumonia, close follow-up recommended to ensure complete resolution. Consider CT chest to further evaluate. 2. Atelectasis or infiltrate at the left base. Electronically Signed   By: Kennith Center M.D.   On: 08/05/2023 07:14    Microbiology: Results for orders placed or performed during the  hospital encounter of 08/05/23  Resp panel by RT-PCR (RSV, Flu A&B, Covid) Anterior Nasal Swab     Status: None   Collection Time: 08/05/23  5:39 AM   Specimen: Anterior Nasal Swab  Result Value Ref Range Status   SARS Coronavirus 2 by RT PCR NEGATIVE NEGATIVE Final    Comment: (NOTE) SARS-CoV-2 target nucleic acids are NOT DETECTED.  The SARS-CoV-2 RNA is generally detectable in upper respiratory specimens during the acute phase of infection. The lowest concentration of SARS-CoV-2 viral copies this assay can detect is 138 copies/mL. A negative result does not preclude SARS-Cov-2 infection and should not be used as the sole basis for treatment or other patient management decisions. A negative result may occur with  improper specimen collection/handling, submission of specimen other than nasopharyngeal swab, presence of viral mutation(s) within the areas targeted by this assay, and inadequate number of viral copies(<138 copies/mL). A negative result must be combined with clinical observations, patient history, and epidemiological information. The expected result is Negative.  Fact Sheet for Patients:  BloggerCourse.com  Fact Sheet for Healthcare Providers:  SeriousBroker.it  This test is no t yet approved or cleared by the [place] FDA and  has been authorized for detection and/or diagnosis of SARS-CoV-2 by FDA under an Emergency Use Authorization (EUA). This EUA will remain  in effect (meaning this test can be used) for the duration of the COVID-19 declaration under Section 564(b)(1) of the Act, 21 U.S.C.section 360bbb-3(b)(1), unless the authorization is terminated  or revoked sooner.       Influenza A by PCR NEGATIVE NEGATIVE Final   Influenza B by PCR NEGATIVE NEGATIVE Final    Comment: (NOTE) The Xpert Xpress SARS-CoV-2/FLU/RSV plus assay is intended as an aid in the diagnosis of influenza from Nasopharyngeal swab  specimens and should not be used as a sole basis for treatment. Nasal washings and aspirates are unacceptable for Xpert Xpress SARS-CoV-2/FLU/RSV testing.  Fact Sheet for Patients: BloggerCourse.com  Fact Sheet for Healthcare Providers: SeriousBroker.it  This test is not yet approved or cleared by the [place] FDA and has been authorized for detection and/or diagnosis of SARS-CoV-2 by FDA under an Emergency Use Authorization (EUA). This EUA will remain in effect (meaning this test can be used) for the duration of the COVID-19 declaration under Section 564(b)(1) of the Act, 21 U.S.C. section 360bbb-3(b)(1), unless the authorization is terminated or revoked.     Resp Syncytial Virus by PCR NEGATIVE NEGATIVE Final    Comment: (NOTE) Fact Sheet for Patients: BloggerCourse.com  Fact Sheet for Healthcare Providers: SeriousBroker.it  This test is not yet approved or cleared by the [place] FDA and has been authorized for detection and/or diagnosis of  SARS-CoV-2 by FDA under an Emergency Use Authorization (EUA). This EUA will remain in effect (meaning this test can be used) for the duration of the COVID-19 declaration under Section 564(b)(1) of the Act, 21 U.S.C. section 360bbb-3(b)(1), unless the authorization is terminated or revoked.  Performed at [facility], [address] [place], [place] [address]   Respiratory (~20 pathogens) panel by PCR     Status: None   Collection Time: 08/06/23  6:28 PM   Specimen: Nasopharyngeal Swab; Respiratory  Result Value Ref Range Status   Adenovirus NOT DETECTED NOT DETECTED Final   Coronavirus 229E NOT DETECTED NOT DETECTED Final    Comment: (NOTE) The Coronavirus on the Respiratory Panel, DOES NOT test for the novel  Coronavirus (2019 nCoV)    Coronavirus HKU1 NOT DETECTED NOT DETECTED Final   Coronavirus NL63 NOT  DETECTED NOT DETECTED Final   Coronavirus OC43 NOT DETECTED NOT DETECTED Final   Metapneumovirus NOT DETECTED NOT DETECTED Final   Rhinovirus / Enterovirus NOT DETECTED NOT DETECTED Final   Influenza A NOT DETECTED NOT DETECTED Final   Influenza B NOT DETECTED NOT DETECTED Final   Parainfluenza Virus 1 NOT DETECTED NOT DETECTED Final   Parainfluenza Virus 2 NOT DETECTED NOT DETECTED Final   Parainfluenza Virus 3 NOT DETECTED NOT DETECTED Final   Parainfluenza Virus 4 NOT DETECTED NOT DETECTED Final   Respiratory Syncytial Virus NOT DETECTED NOT DETECTED Final   Bordetella pertussis NOT DETECTED NOT DETECTED Final   Bordetella Parapertussis NOT DETECTED NOT DETECTED Final   Chlamydophila pneumoniae NOT DETECTED NOT DETECTED Final   Mycoplasma pneumoniae NOT DETECTED NOT DETECTED Final    Comment: Performed at [facility], [address] [place], [place] [address]  MRSA Next Gen by PCR, Nasal     Status: None   Collection Time: 08/07/23  6:15 AM   Specimen: Nasal Mucosa; Nasal Swab  Result Value Ref Range Status   MRSA by PCR Next Gen NOT DETECTED NOT DETECTED Final    Comment: (NOTE) The GeneXpert MRSA Assay (FDA approved for NASAL specimens only), is one component of a comprehensive MRSA colonization surveillance program. It is not intended to diagnose MRSA infection nor to guide or monitor treatment for MRSA infections. Test performance is not FDA approved in patients less than 73 years old. Performed at [facility], [address] [place], [place] [address]     Labs: CBC: Recent Labs  Lab 08/05/23 [phone] 08/06/23 0626 08/07/23 0422 08/08/23 0452 08/09/23 0530 08/10/23 0628  WBC 23.4* 23.0* 18.8* 15.8* 14.4* 13.0*  NEUTROABS 19.7*  --  15.8* 12.8* 11.7*  --   HGB 11.9* 11.1* 10.9* 10.4* 11.9* 12.3  HCT 36.0 33.1* 33.7* 31.9* 36.0 38.2  MCV 82.6 80.9 85.3 83.1 82.8 83.2  PLT 322 353 322 371 447* 511*   Basic Metabolic Panel: Recent Labs  Lab  08/05/23 0539 08/06/23 0626 08/07/23 0422 08/08/23 0452 08/09/23 0530 08/10/23 0628  NA 135 136 136 138 138 134*  K 3.1* 3.3* 4.1 3.7 3.1* 4.5  CL 93* 97* 96* 96* 94* 94*  CO2 29 27 32 32 33* 29  GLUCOSE 119* 99 110* 114* 105* 101*  BUN 11 12 13 13 10 12   CREATININE 0.45 0.51 0.64 0.52 0.46 0.47  CALCIUM 8.4* 8.1* 8.1* 8.3* 8.3* 8.5*  MG 2.0  --   --   --  2.2  --   PHOS 3.0  --   --   --   --   --  Liver Function Tests: Recent Labs  Lab 08/06/23 0626  AST 23  ALT 25  ALKPHOS 114  BILITOT 0.6  PROT 5.7*  ALBUMIN 2.1*   CBG: No results for input(s): "GLUCAP" in the last 168 hours.  Discharge time spent: less than 30 minutes.  Signed: Pennie Banter, DO [facility] 08/10/2023

## 2023-08-10 NOTE — Progress Notes (Signed)
O2 sat on RA at rest: 80% O2 sat on RA while ambulating/during exertion: 72% 02 sat on oxygen while ambulating/during exertion: 92% on 3L

## 2023-08-10 NOTE — Progress Notes (Signed)
PHARMACY CONSULT NOTE - ELECTROLYTES  Pharmacy Consult for Electrolyte Monitoring and Replacement   Recent Labs: Height: 5\' 1"  (154.9 cm) Weight: 72.6 kg (160 lb) IBW/kg (Calculated) : 47.8 Estimated Creatinine Clearance: 51.1 mL/min (by C-G formula based on SCr of 0.47 mg/dL).  Potassium (mmol/L)  Date Value  08/10/2023 4.5  04/01/2012 3.6   Magnesium (mg/dL)  Date Value  56/21/3086 2.2   Calcium (mg/dL)  Date Value  57/84/6962 8.5 (L)   Calcium, Total (mg/dL)  Date Value  95/28/4132 9.4   Albumin (g/dL)  Date Value  44/08/270 2.1 (L)  04/01/2012 3.9   Phosphorus (mg/dL)  Date Value  53/66/4403 3.0   Sodium (mmol/L)  Date Value  08/10/2023 134 (L)  04/01/2012 140   Corrected Ca:    9.6  mg/dL       Assessment  Sharon Mcclure is a 82 y.o. female presenting with PNA. PMH significant for HTN, HLD, CAD, GERD. Pharmacy has been consulted to monitor and replace electrolytes.  Diet: reg/heart healthy MIVF: IV lock Pertinent medications:    Goal of Therapy: Electrolytes WNL  Plan:  No replacement needed today. Will follow BMP in with AM labs.  Thank you for allowing pharmacy to be a part of this patient's care.  Keyanah Kozicki Rodriguez-Guzman PharmD, BCPS 08/10/2023 7:29 AM

## 2023-08-10 NOTE — Progress Notes (Signed)
This RN provided discharge instructions and teaching to the patient and the patient's daughter. Both parties verbalized and demonstrated understanding of the provided instructions. All outstanding questions resolved. All belongings packed and in tow. Transport called to facilitate departure via wheelchair.

## 2023-08-13 ENCOUNTER — Telehealth: Payer: Self-pay

## 2023-08-13 NOTE — Transitions of Care (Post Inpatient/ED Visit) (Signed)
   08/13/2023  Name: Sharon Mcclure MRN: [id number] DOB: August 27, 1941  Today's TOC FU Call Status: Today's TOC FU Call Status:: Unsuccessful Call (1st Attempt) Unsuccessful Call (1st Attempt) Date: 08/13/23  Attempted to reach the patient regarding the most recent Inpatient/ED visit.  Follow Up Plan: Additional outreach attempts will be made to reach the patient to complete the Transitions of Care (Post Inpatient/ED visit) call.   Signature Karena Addison, LPN [facility] Nurse Health Advisor Direct Dial [phone]

## 2023-08-14 NOTE — Transitions of Care (Post Inpatient/ED Visit) (Signed)
   08/14/2023  Name: Sharon Mcclure MRN: [id number] DOB: March 05, 1941  Today's TOC FU Call Status: Today's TOC FU Call Status:: Successful TOC FU Call Completed Unsuccessful Call (1st Attempt) Date: 08/13/23 [facility] FU Call Complete Date: 08/14/23 Patient's Name and Date of Birth confirmed.  Transition Care Management Follow-up Telephone Call Date of Discharge: 08/10/23 Discharge Facility: [facility]) Type of Discharge: Inpatient Admission Primary Inpatient Discharge Diagnosis:: pneumonia How have you been since you were released from the hospital?: Better Any questions or concerns?: No  Items Reviewed: Did you receive and understand the discharge instructions provided?: Yes Medications obtained,verified, and reconciled?: Yes (Medications Reviewed) Any new allergies since your discharge?: No Dietary orders reviewed?: Yes Do you have support at home?: Yes People in Home: child(ren), adult  Medications Reviewed Today: Medications Reviewed Today     Reviewed by Karena Addison, LPN (Licensed Practical Nurse) on 08/14/23 at 1637  Med List Status: <None>   Medication Order Taking? Sig Documenting Provider Last Dose Status Informant  amLODipine (NORVASC) 5 MG tablet [id number] No TAKE ONE TABLET BY MOUTH ONCE A Earleen Newport, MD 08/04/2023 Active Self  chlorpheniramine-HYDROcodone (TUSSIONEX) 10-8 MG/5ML [id number]  Take 5 mLs by mouth at bedtime as needed for cough. Pennie Banter, DO  Active   citalopram (CELEXA) 10 MG tablet [id number] No Take 1 tablet (10 mg total) by mouth daily. Dana Allan, MD 08/04/2023 Active Self  dextromethorphan-guaiFENesin (MUCINEX DM) 30-600 MG 12hr tablet [id number]  Take 1 tablet by mouth 2 (two) times daily for 10 days. Pennie Banter, DO  Active   guaiFENesin-dextromethorphan [employer] DM) 100-10 MG/5ML syrup [id number]  Take 5 mLs by mouth every 4 (four) hours as needed for cough (Daytime cough). Esaw Grandchild A, DO   Active   metroNIDAZOLE (METROGEL) 0.75 % gel [id number] No Apply 1 Application topically 2 (two) times daily. Dana Allan, MD prn unknown Active Self  Multiple Vitamin (MULTIVITAMIN) capsule [id number] No Take 1 capsule by mouth daily. [provider] 08/04/2023 Active Self  Nutritional Supplements (FRUIT & VEGETABLE DAILY) CAPS [id number] No Take by mouth daily. [provider] 08/04/2023 Active Self  pantoprazole (PROTONIX) 40 MG tablet [id number] No Take 40 mg by mouth 2 (two) times daily before a meal. [provider] 08/04/2023 Active Self  traZODone (DESYREL) 50 MG tablet [id number] No TAKE ONE TABLET BY MOUTH EVERY NIGHT AT BEDTIME FOR SLEEP Dana Allan, MD 08/04/2023 Active Self            Home Care and Equipment/Supplies: Were Home Health Services Ordered?: Yes Name of Home Health Agency:: unknown Has Agency set up a time to come to your home?: Yes First Home Health Visit Date: 08/14/23 Any new equipment or medical supplies ordered?: NA  Functional Questionnaire: Do you need assistance with bathing/showering or dressing?: No Do you need assistance with meal preparation?: No Do you need assistance with eating?: No Do you have difficulty maintaining continence: No Do you need assistance with getting out of bed/getting out of a chair/moving?: No Do you have difficulty managing or taking your medications?: No  Follow up appointments reviewed: PCP Follow-up appointment confirmed?: No (declined appt) MD Provider Line [phone] Given: No Specialist Hospital Follow-up appointment confirmed?: NA Do you need transportation to your follow-up appointment?: No Do you understand care options if your condition(s) worsen?: Yes-patient verbalized understanding    SIGNATURE Karena Addison, LPN [facility] Nurse Health Advisor Direct Dial [phone]

## 2023-08-16 ENCOUNTER — Telehealth: Payer: Self-pay

## 2023-08-16 NOTE — Telephone Encounter (Signed)
Copied from CRM [id number]. Topic: Clinical - Home Health Verbal Orders >> Aug 16, 2023 12:53 PM Gaetano Hawthorne wrote: Caller/Agency: [facility] Callback Number: [phone] - Ok to leave a message. Service Requested: Physical Therapy Frequency: Once a week for 1 week Skip the week of christmas Then twice a week for 2 weeks  Any new concerns about the patient? No

## 2023-08-17 NOTE — Telephone Encounter (Signed)
Verbal orders given  

## 2023-08-26 NOTE — Patient Instructions (Incomplete)
It was a pleasure meeting you today. Thank you for allowing me to take part in your health care.  Our goals for today as we discussed include:  Will order ultrasound to check for stones Take ibuprofen 600 mg every 8 hours for pain as needed  Will get some labs today and notify your results.  4 weeks If you have any questions or concerns, please do not hesitate to call the office at [phone].  I look forward to our next visit and until then take care and stay safe.  Regards,   Janyth Riera, MD   [place]  

## 2023-08-26 NOTE — Progress Notes (Unsigned)
° °  SUBJECTIVE:  No chief complaint on file.  HPI Presents for hospital follow up  Check if oxygen decreased from 4.5L  PERTINENT PMH / PSH: ***  OBJECTIVE:  There were no vitals taken for this visit.   Physical Exam     06/11/2023    3:21 PM 05/15/2023   11:19 AM 10/18/2022    8:42 AM 06/06/2022   10:55 AM 06/01/2022    1:25 PM  Depression screen PHQ 2/9  Decreased Interest 0 1 0 0 0  Down, Depressed, Hopeless 0 0 0 0 0  PHQ - 2 Score 0 1 0 0 0  Altered sleeping 0 0     Tired, decreased energy 0 1     Change in appetite 0 0     Feeling bad or failure about yourself  0 1     Trouble concentrating 0 0     Moving slowly or fidgety/restless 0 0     Suicidal thoughts 0 0     PHQ-9 Score 0 3         05/15/2023   11:20 AM 10/18/2022    8:42 AM 04/07/2020    9:35 AM 12/02/2019    9:48 AM  GAD 7 : Generalized Anxiety Score  Nervous, Anxious, on Edge 1 0 0 0  Control/stop worrying 1 0 0 1  Worry too much - different things 1 0 0 1  Trouble relaxing 0 0 0 0  Restless 0 0 0 0  Easily annoyed or irritable 1 0 0 1  Afraid - awful might happen 1 0 0 1  Total GAD 7 Score 5 0 0 4  Anxiety Difficulty Somewhat difficult Not difficult at all Not difficult at all Somewhat difficult    ASSESSMENT/PLAN:  There are no diagnoses linked to this encounter. PDMP reviewed***  No follow-ups on file.  Dana Allan, MD

## 2023-08-27 ENCOUNTER — Ambulatory Visit (INDEPENDENT_AMBULATORY_CARE_PROVIDER_SITE_OTHER): Payer: Medicare Other | Admitting: Family Medicine

## 2023-08-27 ENCOUNTER — Encounter: Payer: Self-pay | Admitting: Family Medicine

## 2023-08-27 ENCOUNTER — Ambulatory Visit (INDEPENDENT_AMBULATORY_CARE_PROVIDER_SITE_OTHER): Payer: Medicare Other

## 2023-08-27 VITALS — BP 118/70 | HR 77 | Temp 98.0°F | Resp 16 | Ht 61.0 in | Wt 157.0 lb

## 2023-08-27 DIAGNOSIS — F39 Unspecified mood [affective] disorder: Secondary | ICD-10-CM | POA: Diagnosis not present

## 2023-08-27 DIAGNOSIS — D509 Iron deficiency anemia, unspecified: Secondary | ICD-10-CM | POA: Diagnosis not present

## 2023-08-27 DIAGNOSIS — E876 Hypokalemia: Secondary | ICD-10-CM | POA: Diagnosis not present

## 2023-08-27 DIAGNOSIS — J189 Pneumonia, unspecified organism: Secondary | ICD-10-CM

## 2023-08-27 DIAGNOSIS — R0982 Postnasal drip: Secondary | ICD-10-CM

## 2023-08-27 LAB — BASIC METABOLIC PANEL
BUN: 15 mg/dL (ref 6–23)
CO2: 30 meq/L (ref 19–32)
Calcium: 9.4 mg/dL (ref 8.4–10.5)
Chloride: 100 meq/L (ref 96–112)
Creatinine, Ser: 0.86 mg/dL (ref 0.40–1.20)
GFR: 63.56 mL/min (ref >60.00)
Glucose, Bld: 77 mg/dL (ref 70–99)
Potassium: 4.6 meq/L (ref 3.5–5.1)
Sodium: 140 meq/L (ref 135–145)

## 2023-08-27 LAB — CBC
HCT: 41.5 % (ref 36.0–46.0)
Hemoglobin: 13.4 g/dL (ref 12.0–15.0)
MCHC: 32.3 g/dL (ref 30.0–36.0)
MCV: 85.9 fL (ref 78.0–100.0)
Platelets: 230 10*3/uL (ref 150.0–400.0)
RBC: 4.84 Mil/uL (ref 3.87–5.11)
RDW: 16.7 % — ABNORMAL HIGH (ref 11.5–15.5)
WBC: 8 10*3/uL (ref 4.0–10.5)

## 2023-08-27 LAB — MAGNESIUM: Magnesium: 2.1 mg/dL (ref 1.5–2.5)

## 2023-08-27 MED ORDER — CITALOPRAM HYDROBROMIDE 20 MG PO TABS: 20.0000 mg | ORAL_TABLET | Freq: Every day | ORAL | 3 refills | Status: DC

## 2023-08-28 ENCOUNTER — Encounter: Payer: Self-pay | Admitting: Family Medicine

## 2023-08-28 DIAGNOSIS — R0982 Postnasal drip: Secondary | ICD-10-CM | POA: Insufficient documentation

## 2023-08-28 MED ORDER — FLUTICASONE PROPIONATE 50 MCG/ACT NA SUSP: 2.0000 | Freq: Every day | NASAL | 6 refills | Status: DC

## 2023-08-28 NOTE — Assessment & Plan Note (Signed)
 Recent hospitalization for pneumonia, now at home for three weeks. Reports feeling weak but no shortness of breath. Lung exam reveals possible residual findings. -Order chest x-ray shows improving pneumonia -Continue deep breathing exercises with incentive spirometry -Follow up if symptoms worsen

## 2023-08-28 NOTE — Assessment & Plan Note (Signed)
 Reports feeling of something in the back of the throat and coughing up clear mucus. History of using Flonase. -Restart Flonase or using nasal saline for symptom relief.

## 2023-08-28 NOTE — Assessment & Plan Note (Signed)
 Reports feeling more agitated and possibly slightly depressed. Currently on Citalopram 10mg . -Increase Citalopram to 20mg  daily.

## 2023-08-30 DIAGNOSIS — Z79891 Long term (current) use of opiate analgesic: Secondary | ICD-10-CM | POA: Diagnosis not present

## 2023-08-30 DIAGNOSIS — I119 Hypertensive heart disease without heart failure: Secondary | ICD-10-CM | POA: Diagnosis not present

## 2023-08-30 DIAGNOSIS — I2510 Atherosclerotic heart disease of native coronary artery without angina pectoris: Secondary | ICD-10-CM | POA: Diagnosis not present

## 2023-08-30 DIAGNOSIS — K760 Fatty (change of) liver, not elsewhere classified: Secondary | ICD-10-CM | POA: Diagnosis not present

## 2023-08-30 DIAGNOSIS — J9601 Acute respiratory failure with hypoxia: Secondary | ICD-10-CM | POA: Diagnosis not present

## 2023-08-30 DIAGNOSIS — Z8673 Personal history of transient ischemic attack (TIA), and cerebral infarction without residual deficits: Secondary | ICD-10-CM | POA: Diagnosis not present

## 2023-08-30 DIAGNOSIS — Z9981 Dependence on supplemental oxygen: Secondary | ICD-10-CM | POA: Diagnosis not present

## 2023-09-04 ENCOUNTER — Telehealth: Payer: Self-pay | Admitting: Family Medicine

## 2023-09-04 DIAGNOSIS — J9601 Acute respiratory failure with hypoxia: Secondary | ICD-10-CM | POA: Diagnosis not present

## 2023-09-04 DIAGNOSIS — Z9981 Dependence on supplemental oxygen: Secondary | ICD-10-CM | POA: Diagnosis not present

## 2023-09-04 DIAGNOSIS — Z8673 Personal history of transient ischemic attack (TIA), and cerebral infarction without residual deficits: Secondary | ICD-10-CM | POA: Diagnosis not present

## 2023-09-04 DIAGNOSIS — K760 Fatty (change of) liver, not elsewhere classified: Secondary | ICD-10-CM | POA: Diagnosis not present

## 2023-09-04 DIAGNOSIS — Z79891 Long term (current) use of opiate analgesic: Secondary | ICD-10-CM | POA: Diagnosis not present

## 2023-09-04 DIAGNOSIS — I119 Hypertensive heart disease without heart failure: Secondary | ICD-10-CM | POA: Diagnosis not present

## 2023-09-04 DIAGNOSIS — I2510 Atherosclerotic heart disease of native coronary artery without angina pectoris: Secondary | ICD-10-CM | POA: Diagnosis not present

## 2023-09-04 NOTE — Telephone Encounter (Signed)
 Copied from CRM [id number]. Topic: General - Other >> Sep 04, 2023  9:37 AM Maisie BROCKS wrote: Reason for CRM: pt's daughter, Luke Sharps, called and stated that the pt was in the hospital for Pneumonia at [facility]. She was given an Oxygen Tank from [employer]. However, Luke stated that [employer] is needing an order with pcp's signature approving them to pick up the oxygen for the patient. Please call and advise with Kim at [phone] .

## 2023-09-05 NOTE — Telephone Encounter (Signed)
 The order is to d/c oxygen is it okay to type up a d/c order? Would you be willing to sign the d/c order?

## 2023-09-05 NOTE — Telephone Encounter (Signed)
 Tried to reach daughter, no ring or voicemail. I have printed the order received from [employer] & placed in Dr Melina Schools folder for a signature in absence of Dr Clent Ridges

## 2023-09-06 DIAGNOSIS — Z8673 Personal history of transient ischemic attack (TIA), and cerebral infarction without residual deficits: Secondary | ICD-10-CM | POA: Diagnosis not present

## 2023-09-06 DIAGNOSIS — I2510 Atherosclerotic heart disease of native coronary artery without angina pectoris: Secondary | ICD-10-CM | POA: Diagnosis not present

## 2023-09-06 DIAGNOSIS — I119 Hypertensive heart disease without heart failure: Secondary | ICD-10-CM | POA: Diagnosis not present

## 2023-09-06 DIAGNOSIS — Z9981 Dependence on supplemental oxygen: Secondary | ICD-10-CM | POA: Diagnosis not present

## 2023-09-06 DIAGNOSIS — K760 Fatty (change of) liver, not elsewhere classified: Secondary | ICD-10-CM | POA: Diagnosis not present

## 2023-09-06 DIAGNOSIS — Z79891 Long term (current) use of opiate analgesic: Secondary | ICD-10-CM | POA: Diagnosis not present

## 2023-09-06 DIAGNOSIS — J9601 Acute respiratory failure with hypoxia: Secondary | ICD-10-CM | POA: Diagnosis not present

## 2023-09-06 NOTE — Telephone Encounter (Signed)
 The home health nurse faxed over pt's O2 sats, Dr. Darrick Huntsman reviewed them and signed off on stopping the O2. I have typed up a letter and placed in quick sign folder for signature to fax to [employer] [facility].

## 2023-09-11 DIAGNOSIS — I2510 Atherosclerotic heart disease of native coronary artery without angina pectoris: Secondary | ICD-10-CM | POA: Diagnosis not present

## 2023-09-11 DIAGNOSIS — F39 Unspecified mood [affective] disorder: Secondary | ICD-10-CM

## 2023-09-11 DIAGNOSIS — Z8673 Personal history of transient ischemic attack (TIA), and cerebral infarction without residual deficits: Secondary | ICD-10-CM | POA: Diagnosis not present

## 2023-09-11 DIAGNOSIS — J189 Pneumonia, unspecified organism: Secondary | ICD-10-CM | POA: Diagnosis not present

## 2023-09-11 DIAGNOSIS — Z79891 Long term (current) use of opiate analgesic: Secondary | ICD-10-CM | POA: Diagnosis not present

## 2023-09-11 DIAGNOSIS — E785 Hyperlipidemia, unspecified: Secondary | ICD-10-CM | POA: Diagnosis not present

## 2023-09-11 DIAGNOSIS — I119 Hypertensive heart disease without heart failure: Secondary | ICD-10-CM | POA: Diagnosis not present

## 2023-09-11 DIAGNOSIS — F419 Anxiety disorder, unspecified: Secondary | ICD-10-CM

## 2023-09-11 DIAGNOSIS — E559 Vitamin D deficiency, unspecified: Secondary | ICD-10-CM | POA: Diagnosis not present

## 2023-09-11 DIAGNOSIS — J9601 Acute respiratory failure with hypoxia: Secondary | ICD-10-CM | POA: Diagnosis not present

## 2023-09-11 DIAGNOSIS — K760 Fatty (change of) liver, not elsewhere classified: Secondary | ICD-10-CM | POA: Diagnosis not present

## 2023-09-11 DIAGNOSIS — Z9981 Dependence on supplemental oxygen: Secondary | ICD-10-CM | POA: Diagnosis not present

## 2023-09-22 ENCOUNTER — Other Ambulatory Visit: Payer: Self-pay | Admitting: Family Medicine

## 2023-10-29 ENCOUNTER — Other Ambulatory Visit: Payer: Self-pay | Admitting: Family Medicine

## 2023-10-29 DIAGNOSIS — G4700 Insomnia, unspecified: Secondary | ICD-10-CM

## 2023-10-31 ENCOUNTER — Ambulatory Visit: Payer: Medicare Other | Admitting: Family Medicine

## 2023-11-14 ENCOUNTER — Ambulatory Visit (INDEPENDENT_AMBULATORY_CARE_PROVIDER_SITE_OTHER): Admitting: Family Medicine

## 2023-11-14 ENCOUNTER — Encounter: Payer: Self-pay | Admitting: Family Medicine

## 2023-11-14 VITALS — BP 130/60 | HR 67 | Temp 97.4°F | Resp 20 | Ht 61.0 in | Wt 159.1 lb

## 2023-11-14 DIAGNOSIS — L719 Rosacea, unspecified: Secondary | ICD-10-CM

## 2023-11-14 DIAGNOSIS — E785 Hyperlipidemia, unspecified: Secondary | ICD-10-CM | POA: Diagnosis not present

## 2023-11-14 DIAGNOSIS — F39 Unspecified mood [affective] disorder: Secondary | ICD-10-CM

## 2023-11-14 DIAGNOSIS — E538 Deficiency of other specified B group vitamins: Secondary | ICD-10-CM

## 2023-11-14 DIAGNOSIS — E559 Vitamin D deficiency, unspecified: Secondary | ICD-10-CM

## 2023-11-14 DIAGNOSIS — I10 Essential (primary) hypertension: Secondary | ICD-10-CM | POA: Diagnosis not present

## 2023-11-14 DIAGNOSIS — G4700 Insomnia, unspecified: Secondary | ICD-10-CM

## 2023-11-14 MED ORDER — CITALOPRAM HYDROBROMIDE 10 MG PO TABS: 10.0000 mg | ORAL_TABLET | Freq: Every day | ORAL | Status: DC

## 2023-11-14 MED ORDER — TRAZODONE HCL 50 MG PO TABS: ORAL_TABLET | ORAL | 3 refills | Status: DC

## 2023-11-14 MED ORDER — METRONIDAZOLE 0.75 % EX GEL: 1.0000 | Freq: Two times a day (BID) | CUTANEOUS | 6 refills | Status: DC

## 2023-11-14 MED ORDER — AMLODIPINE BESYLATE 5 MG PO TABS
5.0000 mg | ORAL_TABLET | Freq: Every day | ORAL | 3 refills | Status: DC
Start: 2023-11-14 — End: 2024-02-14

## 2023-11-14 NOTE — Progress Notes (Signed)
 SUBJECTIVE:   Chief Complaint  Patient presents with   Medical Management of Chronic Issues    6 month follow up   HPI Presents for follow up chronic disease management  Discussed the use of AI scribe software for clinical note transcription with the patient, who gave verbal consent to proceed.  History of Present Illness Sharon Mcclure is an 83 year old female who presents for a follow-up regarding her Celexa dosage. She is accompanied by her brother, Marthann Schiller, who lives with her.  Approximately a month ago, she reduced her Celexa dose from 20 mg to 10 mg by cutting the tablets in half, which she feels has improved her energy levels and productivity. She takes the medication daily and does not require a refill at this time.  She continues to take trazodone at night, one tablet, for sleep, which remains effective for her.  She uses metronidazole gel as needed for a skin condition, noting improvement. She does not use it frequently but finds it effective in managing her symptoms.  No chest pain, shortness of breath, or significant swelling in her legs. She may need a refill for amlodipine.  Her brother Marthann Schiller, who is a Music therapist and owns his own business, lives with her and provides support. She feels some frustration with his rest periods after work but acknowledges his hard work and the support he provides, including ensuring she eats well and taking her to breakfast and grocery shopping every Sunday.    PERTINENT PMH / PSH: As above  OBJECTIVE:  BP 130/60   Pulse 67   Temp (!) 97.4 F (36.3 C)   Resp 20   Ht 5\' 1"  (1.549 m)   Wt 159 lb 2 oz (72.2 kg)   SpO2 95%   BMI 30.07 kg/m    Physical Exam Vitals reviewed.  Constitutional:      General: She is not in acute distress.    Appearance: Normal appearance. She is obese. She is not ill-appearing, toxic-appearing or diaphoretic.  Eyes:     General:        Right eye: No discharge.        Left eye: No discharge.      Conjunctiva/sclera: Conjunctivae normal.  Cardiovascular:     Rate and Rhythm: Normal rate and regular rhythm.     Heart sounds: Normal heart sounds.  Pulmonary:     Effort: Pulmonary effort is normal.     Breath sounds: Normal breath sounds.  Abdominal:     General: Bowel sounds are normal.  Musculoskeletal:        General: Normal range of motion.  Skin:    General: Skin is warm and dry.  Neurological:     General: No focal deficit present.     Mental Status: She is alert and oriented to person, place, and time. Mental status is at baseline.  Psychiatric:        Mood and Affect: Mood normal.        Behavior: Behavior normal.        Thought Content: Thought content normal.        Judgment: Judgment normal.           11/14/2023   10:14 AM 06/11/2023    3:21 PM 05/15/2023   11:19 AM 10/18/2022    8:42 AM 06/06/2022   10:55 AM  Depression screen PHQ 2/9  Decreased Interest 0 0 1 0 0  Down, Depressed, Hopeless 0 0 0 0 0  PHQ -  2 Score 0 0 1 0 0  Altered sleeping 0 0 0    Tired, decreased energy 0 0 1    Change in appetite 0 0 0    Feeling bad or failure about yourself  0 0 1    Trouble concentrating 0 0 0    Moving slowly or fidgety/restless 0 0 0    Suicidal thoughts 0 0 0    PHQ-9 Score 0 0 3    Difficult doing work/chores Not difficult at all          11/14/2023   10:14 AM 05/15/2023   11:20 AM 10/18/2022    8:42 AM 04/07/2020    9:35 AM  GAD 7 : Generalized Anxiety Score  Nervous, Anxious, on Edge 0 1 0 0  Control/stop worrying 0 1 0 0  Worry too much - different things 0 1 0 0  Trouble relaxing 0 0 0 0  Restless 0 0 0 0  Easily annoyed or irritable 0 1 0 0  Afraid - awful might happen 0 1 0 0  Total GAD 7 Score 0 5 0 0  Anxiety Difficulty Not difficult at all Somewhat difficult Not difficult at all Not difficult at all    ASSESSMENT/PLAN:  Essential hypertension Assessment & Plan: Blood pressure well-controlled on amlodipine. - Refill amlodipine 5 mg  daily     Orders: -     Comprehensive metabolic panel; Future -     amLODIPine Besylate; Take 1 tablet (5 mg total) by mouth daily.  Dispense: 90 tablet; Refill: 3  Hyperlipidemia LDL goal <70 -     Lipid panel; Future  Vitamin D deficiency -     VITAMIN D 25 Hydroxy (Vit-D Deficiency, Fractures); Future  Mood disorder (HCC) Assessment & Plan: Reduced Celexa to 10 mg improved activity and productivity. Current dose agreed upon with potential future adjustments. - Continue Celexa to 10 mg daily. - Refill Trazodone 50 mg qhs - Monitor mood and activity levels. - Consider dose adjustment if symptoms change.  Orders: -     Citalopram Hydrobromide; Take 1 tablet (10 mg total) by mouth daily. -     traZODone HCl; TAKE ONE TABLET BY MOUTH EVERY NIGHT AT BEDTIME FOR SLEEP  Dispense: 90 tablet; Refill: 3  Vitamin B 12 deficiency -     Vitamin B12; Future  Rosacea Assessment & Plan: Skin condition well-managed with metronidazole gel. - Refill metronidazole gel prescription. - Continue daily application of metronidazole gel.     Orders: -     metroNIDAZOLE; Apply 1 Application topically 2 (two) times daily.  Dispense: 45 g; Refill: 6    PDMP reviewed  Return in about 3 months (around 02/14/2024) for PCP, Mood.  Dana Allan, MD

## 2023-11-14 NOTE — Patient Instructions (Addendum)
 It was a pleasure meeting you today. Thank you for allowing me to take part in your health care.  Our goals for today as we discussed include:  Continue Celexa 10 mg daily  Refills sent for all medications  Schedule fasting lab appointment.  Fast 10 hrs  This is a list of the screening recommended for you and due dates:  Health Maintenance  Topic Date Due   COVID-19 Vaccine (3 - Pfizer risk series) 11/14/2019   Mammogram  08/27/2024*   Medicare Annual Wellness Visit  06/10/2024   DTaP/Tdap/Td vaccine (2 - Td or Tdap) 04/06/2029   Pneumonia Vaccine  Completed   Flu Shot  Completed   DEXA scan (bone density measurement)  Completed   Zoster (Shingles) Vaccine  Completed   HPV Vaccine  Aged Out   Hepatitis C Screening  Discontinued  *Topic was postponed. The date shown is not the original due date.      If you have any questions or concerns, please do not hesitate to call the office at [phone].  I look forward to our next visit and until then take care and stay safe.  Regards,   Dana Allan, MD   [facility]

## 2023-11-20 ENCOUNTER — Encounter: Payer: Self-pay | Admitting: Family Medicine

## 2023-11-20 DIAGNOSIS — E538 Deficiency of other specified B group vitamins: Secondary | ICD-10-CM | POA: Insufficient documentation

## 2023-11-20 HISTORY — DX: Deficiency of other specified B group vitamins: E53.8

## 2023-11-20 NOTE — Assessment & Plan Note (Signed)
 Skin condition well-managed with metronidazole gel. - Refill metronidazole gel prescription. - Continue daily application of metronidazole gel.

## 2023-11-20 NOTE — Assessment & Plan Note (Signed)
 Blood pressure well-controlled on amlodipine. - Refill amlodipine 5 mg daily

## 2023-11-20 NOTE — Assessment & Plan Note (Addendum)
 Reduced Celexa to 10 mg improved activity and productivity. Current dose agreed upon with potential future adjustments. - Continue Celexa to 10 mg daily. - Refill Trazodone 50 mg qhs - Monitor mood and activity levels. - Consider dose adjustment if symptoms change.

## 2023-12-12 ENCOUNTER — Other Ambulatory Visit

## 2024-02-13 ENCOUNTER — Ambulatory Visit (INDEPENDENT_AMBULATORY_CARE_PROVIDER_SITE_OTHER): Admitting: Family Medicine

## 2024-02-13 DIAGNOSIS — I10 Essential (primary) hypertension: Secondary | ICD-10-CM

## 2024-02-13 DIAGNOSIS — L719 Rosacea, unspecified: Secondary | ICD-10-CM

## 2024-02-13 DIAGNOSIS — R0982 Postnasal drip: Secondary | ICD-10-CM

## 2024-02-13 DIAGNOSIS — F39 Unspecified mood [affective] disorder: Secondary | ICD-10-CM

## 2024-02-14 ENCOUNTER — Encounter: Payer: Self-pay | Admitting: Family Medicine

## 2024-02-14 MED ORDER — CITALOPRAM HYDROBROMIDE 10 MG PO TABS
10.0000 mg | ORAL_TABLET | Freq: Every day | ORAL | 3 refills | Status: DC
Start: 2024-02-14 — End: 2024-05-21

## 2024-02-14 MED ORDER — AMLODIPINE BESYLATE 5 MG PO TABS
5.0000 mg | ORAL_TABLET | Freq: Every day | ORAL | 3 refills | Status: DC
Start: 2024-02-14 — End: 2024-05-21

## 2024-02-14 MED ORDER — FLUTICASONE PROPIONATE 50 MCG/ACT NA SUSP: 2.0000 | Freq: Every day | NASAL | 6 refills | Status: DC

## 2024-02-14 MED ORDER — METRONIDAZOLE 0.75 % EX GEL: 1.0000 | Freq: Two times a day (BID) | CUTANEOUS | 6 refills | Status: DC

## 2024-02-14 MED ORDER — PANTOPRAZOLE SODIUM 40 MG PO TBEC: 40.0000 mg | DELAYED_RELEASE_TABLET | Freq: Every day | ORAL | 1 refills | Status: DC

## 2024-02-14 MED ORDER — TRAZODONE HCL 50 MG PO TABS: ORAL_TABLET | ORAL | 3 refills | Status: DC

## 2024-02-14 NOTE — Progress Notes (Signed)
 Medications refilled

## 2024-02-20 ENCOUNTER — Ambulatory Visit: Admitting: Internal Medicine

## 2024-02-20 ENCOUNTER — Encounter: Payer: Self-pay | Admitting: Internal Medicine

## 2024-02-20 VITALS — BP 114/66 | HR 71 | Temp 97.7°F | Ht 61.0 in | Wt 161.0 lb

## 2024-02-20 DIAGNOSIS — E559 Vitamin D deficiency, unspecified: Secondary | ICD-10-CM | POA: Diagnosis not present

## 2024-02-20 DIAGNOSIS — R7303 Prediabetes: Secondary | ICD-10-CM

## 2024-02-20 DIAGNOSIS — R5383 Other fatigue: Secondary | ICD-10-CM

## 2024-02-20 DIAGNOSIS — K219 Gastro-esophageal reflux disease without esophagitis: Secondary | ICD-10-CM

## 2024-02-20 DIAGNOSIS — F419 Anxiety disorder, unspecified: Secondary | ICD-10-CM

## 2024-02-20 DIAGNOSIS — E538 Deficiency of other specified B group vitamins: Secondary | ICD-10-CM | POA: Diagnosis not present

## 2024-02-20 DIAGNOSIS — I10 Essential (primary) hypertension: Secondary | ICD-10-CM

## 2024-02-20 DIAGNOSIS — F39 Unspecified mood [affective] disorder: Secondary | ICD-10-CM

## 2024-02-20 DIAGNOSIS — L719 Rosacea, unspecified: Secondary | ICD-10-CM

## 2024-02-20 HISTORY — DX: Other fatigue: R53.83

## 2024-02-20 LAB — CBC WITH DIFFERENTIAL/PLATELET
Basophils Absolute: 0.1 10*3/uL (ref 0.0–0.1)
Basophils Relative: 1.1 % (ref 0.0–3.0)
Eosinophils Absolute: 0.1 10*3/uL (ref 0.0–0.7)
Eosinophils Relative: 2 % (ref 0.0–5.0)
HCT: 38.9 % (ref 36.0–46.0)
Hemoglobin: 12.6 g/dL (ref 12.0–15.0)
Lymphocytes Relative: 40.2 % (ref 12.0–46.0)
Lymphs Abs: 2.8 10*3/uL (ref 0.7–4.0)
MCHC: 32.5 g/dL (ref 30.0–36.0)
MCV: 81.6 fl (ref 78.0–100.0)
Monocytes Absolute: 0.7 10*3/uL (ref 0.1–1.0)
Monocytes Relative: 9.7 % (ref 3.0–12.0)
Neutro Abs: 3.3 10*3/uL (ref 1.4–7.7)
Neutrophils Relative %: 47 % (ref 43.0–77.0)
Platelets: 242 10*3/uL (ref 150.0–400.0)
RBC: 4.77 Mil/uL (ref 3.87–5.11)
RDW: 17 % — ABNORMAL HIGH (ref 11.5–15.5)
WBC: 7 10*3/uL (ref 4.0–10.5)

## 2024-02-20 LAB — LIPID PANEL
Cholesterol: 241 mg/dL — ABNORMAL HIGH (ref 0–200)
HDL: 55.3 mg/dL (ref >39.00)
LDL Cholesterol: 150 mg/dL — ABNORMAL HIGH (ref 0–99)
NonHDL: 186.18
Total CHOL/HDL Ratio: 4
Triglycerides: 182 mg/dL — ABNORMAL HIGH (ref 0.0–149.0)
VLDL: 36.4 mg/dL (ref 0.0–40.0)

## 2024-02-20 LAB — COMPREHENSIVE METABOLIC PANEL WITH GFR
ALT: 13 U/L (ref 0–35)
AST: 19 U/L (ref 0–37)
Albumin: 4 g/dL (ref 3.5–5.2)
Alkaline Phosphatase: 53 U/L (ref 39–117)
BUN: 20 mg/dL (ref 6–23)
CO2: 30 meq/L (ref 19–32)
Calcium: 9.5 mg/dL (ref 8.4–10.5)
Chloride: 102 meq/L (ref 96–112)
Creatinine, Ser: 0.85 mg/dL (ref 0.40–1.20)
GFR: 64.24 mL/min (ref >60.00)
Glucose, Bld: 125 mg/dL — ABNORMAL HIGH (ref 70–99)
Potassium: 4 meq/L (ref 3.5–5.1)
Sodium: 140 meq/L (ref 135–145)
Total Bilirubin: 0.4 mg/dL (ref 0.2–1.2)
Total Protein: 6.7 g/dL (ref 6.0–8.3)

## 2024-02-20 LAB — VITAMIN B12: Vitamin B-12: 837 pg/mL (ref 211–911)

## 2024-02-20 LAB — VITAMIN D 25 HYDROXY (VIT D DEFICIENCY, FRACTURES): VITD: 45.37 ng/mL (ref 30.00–100.00)

## 2024-02-20 LAB — HEMOGLOBIN A1C: Hgb A1c MFr Bld: 6 % (ref 4.6–6.5)

## 2024-02-20 NOTE — Assessment & Plan Note (Signed)
-   Patient has a history of vitamin D  deficiency -Her last vitamin D  level was within normal range -Will recheck vitamin D  levels today -No further workup at this time

## 2024-02-20 NOTE — Patient Instructions (Signed)
-   It was a pleasure meeting you today -Your blood pressure is well-controlled.  Keep up the great work! - Your rosacea is slowly improving.  Continue with the metronidazole  gel -We will check some blood work on you today including your kidney function, liver function, cholesterol, complete blood counts, vitamin B12 and vitamin D  levels as well as your thyroid  function -We will contact you with these results -Please contact us  if you have any questions or concerns or if you need any refills

## 2024-02-20 NOTE — Assessment & Plan Note (Signed)
-   This problem is chronic and stable -Patient states that her symptoms are well-controlled on Protonix  40 mg daily -Will continue with current medication regimen for now -No further workup at this time

## 2024-02-20 NOTE — Assessment & Plan Note (Signed)
-   This problem is chronic and improving -Patient's PHQ-9 score today was 1 -Will continue with Celexa  10 mg daily -She still uses trazodone  50 mg nightly at night and states that she sleeps well with this medication -Continue with current medication regimen -No further workup at this time

## 2024-02-20 NOTE — Progress Notes (Signed)
 Acute Office Visit  Subjective:     Patient ID: Sharon Mcclure, female    DOB: 03-01-1941, 83 y.o.   MRN: [id number]  Chief Complaint  Patient presents with   Medical Management of Chronic Issues    HPI Patient is in today for routine follow-up of her chronic medical issues including hypertension, rosacea and GERD.  Patient with history of hypertension on amlodipine  and her blood pressure is well-controlled today at 114/66.  She denies any chest pain or shortness of breath or headaches.  Patient does have a history of GERD on Protonix  chronically and states that her reflux symptoms are well-controlled.  Patient does have a history of depression and anxiety and states that the symptoms are well-controlled on Celexa  10 mg daily.  She also has a history of rosacea and has been using the metronidazole  gel and states that this has improved.  Still has flares but symptoms are much better on metronidazole  gel.   Review of Systems  Constitutional: Negative.   HENT: Negative.    Respiratory: Negative.    Cardiovascular: Negative.   Gastrointestinal: Negative.   Musculoskeletal: Negative.   Neurological: Negative.   Psychiatric/Behavioral: Negative.          Objective:    BP 114/66   Pulse 71   Temp 97.7 F (36.5 C)   Ht 5' 1 (1.549 m)   Wt 161 lb (73 kg)   SpO2 97%   BMI 30.42 kg/m    Physical Exam Constitutional:      Appearance: Normal appearance.  HENT:     Mouth/Throat:     Pharynx: No oropharyngeal exudate or posterior oropharyngeal erythema.   Cardiovascular:     Rate and Rhythm: Normal rate and regular rhythm.     Heart sounds: Normal heart sounds.  Pulmonary:     Breath sounds: Normal breath sounds. No wheezing or rales.  Abdominal:     General: There is no distension.     Palpations: Abdomen is soft.     Tenderness: There is no abdominal tenderness. There is no guarding or rebound.   Musculoskeletal:        General: No swelling or tenderness.      Cervical back: Neck supple.  Lymphadenopathy:     Cervical: No cervical adenopathy.   Neurological:     Mental Status: She is alert.   Psychiatric:        Mood and Affect: Mood normal.        Behavior: Behavior normal.     No results found for any visits on 02/20/24.      Assessment & Plan:   Problem List Items Addressed This Visit       Cardiovascular and Mediastinum   Essential hypertension - Primary   - This problem is chronic and stable -Patient's blood pressure is well-controlled today at 114/66 -She is compliant with amlodipine  5 mg daily -Continue with current medication regimen for now -No further workup at this time      Relevant Orders   Lipid panel   Comprehensive metabolic panel with GFR     Digestive   GERD (gastroesophageal reflux disease)   - This problem is chronic and stable -Patient states that her symptoms are well-controlled on Protonix  40 mg daily -Will continue with current medication regimen for now -No further workup at this time        Other   Anxiety   - This problem is chronic and improved -Patient's GAD-7 score today was  0 -She is on Celexa  10 mg daily -Will continue current regimen for now      Fatigue   - Patient states that she has been fatigued over the last couple of weeks and has been taking orange juice and has felt better -States that she still feels mildly fatigued -Will check a CBC and a TSH today for further evaluation -No further workup at this time      Relevant Orders   CBC with Differential/Platelet   TSH   Mood disorder (HCC)   - This problem is chronic and improving -Patient's PHQ-9 score today was 1 -Will continue with Celexa  10 mg daily -She still uses trazodone  50 mg nightly at night and states that she sleeps well with this medication -Continue with current medication regimen -No further workup at this time      Prediabetes   - This problem is chronic and stable -Last A1c was 5.8 in February  of last year -Will recheck A1c today -No further workup at this time      Relevant Orders   Hemoglobin A1c   Rosacea   - This problem is chronic and improving -Patient states that she occasionally has outbreaks of her rosacea but these improved with metronidazole  gel and some Vaseline -Will continue with her medication regimen for now -No further workup at this time      Vitamin B 12 deficiency   - Patient is a history of vitamin B12 deficiency -Her last B12 level was significantly elevated in February of last year (over 1000) on supplementation -Will check vitamin B12 level today -No further workup at this time -Patient remains on a multivitamin      Relevant Orders   CBC with Differential/Platelet   Vitamin B12   Vitamin D  deficiency   - Patient has a history of vitamin D  deficiency -Her last vitamin D  level was within normal range -Will recheck vitamin D  levels today -No further workup at this time      Relevant Orders   VITAMIN D  25 Hydroxy (Vit-D Deficiency, Fractures)    No orders of the defined types were placed in this encounter.   No follow-ups on file.  [place], MD

## 2024-02-20 NOTE — Assessment & Plan Note (Signed)
-   This problem is chronic and improved -Patient's GAD-7 score today was 0 -She is on Celexa  10 mg daily -Will continue current regimen for now

## 2024-02-20 NOTE — Assessment & Plan Note (Signed)
-   This problem is chronic and stable -Patient's blood pressure is well-controlled today at 114/66 -She is compliant with amlodipine  5 mg daily -Continue with current medication regimen for now -No further workup at this time

## 2024-02-20 NOTE — Assessment & Plan Note (Signed)
-   Patient is a history of vitamin B12 deficiency -Her last B12 level was significantly elevated in February of last year (over 1000) on supplementation -Will check vitamin B12 level today -No further workup at this time -Patient remains on a multivitamin

## 2024-02-20 NOTE — Assessment & Plan Note (Signed)
-   This problem is chronic and improving -Patient states that she occasionally has outbreaks of her rosacea but these improved with metronidazole  gel and some Vaseline -Will continue with her medication regimen for now -No further workup at this time

## 2024-02-20 NOTE — Assessment & Plan Note (Signed)
-   This problem is chronic and stable -Last A1c was 5.8 in February of last year -Will recheck A1c today -No further workup at this time

## 2024-02-20 NOTE — Assessment & Plan Note (Signed)
-   Patient states that she has been fatigued over the last couple of weeks and has been taking orange juice and has felt better -States that she still feels mildly fatigued -Will check a CBC and a TSH today for further evaluation -No further workup at this time

## 2024-02-22 ENCOUNTER — Ambulatory Visit: Payer: Self-pay | Admitting: Internal Medicine

## 2024-05-21 ENCOUNTER — Ambulatory Visit

## 2024-05-21 VITALS — BP 124/70 | HR 64 | Temp 98.2°F | Ht 60.0 in | Wt 160.2 lb

## 2024-05-21 DIAGNOSIS — L989 Disorder of the skin and subcutaneous tissue, unspecified: Secondary | ICD-10-CM

## 2024-05-21 DIAGNOSIS — D329 Benign neoplasm of meninges, unspecified: Secondary | ICD-10-CM

## 2024-05-21 DIAGNOSIS — G720 Drug-induced myopathy: Secondary | ICD-10-CM | POA: Diagnosis not present

## 2024-05-21 DIAGNOSIS — L719 Rosacea, unspecified: Secondary | ICD-10-CM | POA: Diagnosis not present

## 2024-05-21 DIAGNOSIS — I10 Essential (primary) hypertension: Secondary | ICD-10-CM | POA: Diagnosis not present

## 2024-05-21 DIAGNOSIS — F39 Unspecified mood [affective] disorder: Secondary | ICD-10-CM

## 2024-05-21 DIAGNOSIS — E782 Mixed hyperlipidemia: Secondary | ICD-10-CM

## 2024-05-21 DIAGNOSIS — T466X5A Adverse effect of antihyperlipidemic and antiarteriosclerotic drugs, initial encounter: Secondary | ICD-10-CM | POA: Diagnosis not present

## 2024-05-21 DIAGNOSIS — R0982 Postnasal drip: Secondary | ICD-10-CM | POA: Diagnosis not present

## 2024-05-21 DIAGNOSIS — K219 Gastro-esophageal reflux disease without esophagitis: Secondary | ICD-10-CM

## 2024-05-21 DIAGNOSIS — Z23 Encounter for immunization: Secondary | ICD-10-CM

## 2024-05-21 DIAGNOSIS — R7303 Prediabetes: Secondary | ICD-10-CM | POA: Diagnosis not present

## 2024-05-21 MED ORDER — PANTOPRAZOLE SODIUM 40 MG PO TBEC: 40.0000 mg | DELAYED_RELEASE_TABLET | Freq: Every day | ORAL | 1 refills | Status: AC

## 2024-05-21 MED ORDER — AMLODIPINE BESYLATE 5 MG PO TABS: 5.0000 mg | ORAL_TABLET | Freq: Every day | ORAL | 3 refills | Status: AC

## 2024-05-21 MED ORDER — BENZOYL PEROXIDE 5 % EX LIQD: CUTANEOUS | 12 refills | Status: DC

## 2024-05-21 MED ORDER — FLUTICASONE PROPIONATE 50 MCG/ACT NA SUSP: 2.0000 | Freq: Every day | NASAL | 6 refills | Status: AC

## 2024-05-21 MED ORDER — METRONIDAZOLE 0.75 % EX GEL: 1.0000 | Freq: Two times a day (BID) | CUTANEOUS | 6 refills | Status: DC

## 2024-05-21 MED ORDER — TRAZODONE HCL 50 MG PO TABS: ORAL_TABLET | ORAL | 3 refills | Status: AC

## 2024-05-21 MED ORDER — CITALOPRAM HYDROBROMIDE 20 MG PO TABS: 20.0000 mg | ORAL_TABLET | Freq: Every day | ORAL | 3 refills | Status: AC

## 2024-05-21 NOTE — Patient Instructions (Addendum)
-   Use Benzoyl peroxide :  Apply a thin layer to clean, dry skin, avoiding eyes, every other day. Continue Metronidazole  twice a day. I am referring you to a dermatologist for skin evaluation. If you do not hear from the in 2 weeks please reach out to us .    - Please schedule a fasting lab appointment after 05/23/24. Follow up with Dr. Abbey in 3-4 months.    - In regards to Protonix , you can take this one tablet in an empty stomach in the morning to help with acid reflux.

## 2024-05-21 NOTE — Assessment & Plan Note (Signed)
 Chronic and stable on daily nasal Flonase , continue. Refill sent

## 2024-05-21 NOTE — Assessment & Plan Note (Signed)
 Reviewed A1c from 02/20/24 in prediabetic range. Repeat A1c, lab ordered.

## 2024-05-21 NOTE — Assessment & Plan Note (Signed)
 Chronic facial lesion that is non healing despite use of Metronidazole  gel BID. She has a h/o BCC of face. I recommend dermatology evaluation for potential biopsy of non-healing lesion. At the meantime treatment with: Metronidazole  0.75% BID, add benzoyl peroxide  5%: a thin layer to clean, dry skin, avoiding eyes, every other day.

## 2024-05-21 NOTE — Assessment & Plan Note (Signed)
 History of statin intolerance. Did not tolerate Zetia, Repatha  either.

## 2024-05-21 NOTE — Assessment & Plan Note (Signed)
 First evident in imaging from 2021, repeat imaging, MRI 02/23/2021 redemonstrated 7 mm partially calcified meningioma right parietal lobe. No new neurological symptoms. I will discuss this with the patient during her follow up visit. I recommend we repeat imaging for surveillance.

## 2024-05-21 NOTE — Assessment & Plan Note (Signed)
 Chronic, BP within goal. Continue amlodipine  5 mg daily. Check CMP

## 2024-05-21 NOTE — Progress Notes (Signed)
 Established Patient Office Visit TOC from Dr. Hope    Subjective  Patient ID: Sharon Mcclure, female    DOB: 05-Jun-1941  Age: 83 y.o. MRN: [id number]  Chief Complaint  Patient presents with   Establish Care   Rosacea    She  has a past medical history of Abnormal EKG (05/29/2019), Acute hypoxic respiratory failure (HCC) (08/06/2023), Acute respiratory failure with hypoxia (HCC) (08/05/2023), Annual physical exam (10/13/2020), Anxiety, CAD (coronary artery disease), Cancer (HCC), Compression fracture of L3 vertebra (HCC) (10/15/2020), Coronary artery calcification (05/29/2019), COVID-19, Edema (12/08/2021), Fatty liver, GERD (gastroesophageal reflux disease), Heart murmur, Hemorrhoids, Hiatal hernia, History of skin cancer, HTN (hypertension), Hyperlipidemia, Insomnia, Insomnia, Left groin pain (02/20/2011), Osteoarthritis of finger of right hand (01/29/2017), Overactive bladder (10/13/2020), Pain in thoracic spine (01/08/2015), Pneumonia (05/2015), Pneumonia (06/27/2015), PVC's (premature ventricular contractions) (09/23/2020), Rosacea (11/24/2013), Stroke determined by clinical assessment (HCC) (02/23/2021), Stroke-like episode (02/23/2021), Thoracic compression fracture (HCC) (01/08/2015), Vitamin B 12 deficiency (11/20/2023), Vitamin D  deficiency, and Vitamin D  deficiency (09/29/2013).  HPI Discussed the use of AI scribe software for clinical note transcription with the patient, who gave verbal consent to proceed.  History of Present Illness Sharon Mcclure is an 83 year old female presenting for TOC from previous PCP, chronic medication  management and has acute concerns. She is here with her daughter, Chardai.   - Facial skin lesions: She has a persistent facial rash characterized by roughness, pain, and small 'old bitty things' that does not fully resolve. The rash is not associated with vesicles but involves dry skin that peels, leaving a hard residue. Previously diagnosed as rosacea,  she questions this due to the presence of sores. She has known h/o BCC of face, underwent surgery in 2021, has not followed up with dermatologist since then. She uses Metronidazole  gel twice a day without improvement in symptoms. She has h/o AK as well. She is not immunocompromised. No fever, mucosal lesion.    - HTN, hyperlipidemia:   She takes amlodipine  5 mg for hypertension and reports no issues with chest pain, palpitations, or leg swelling. She does not monitor her blood pressure at home but feels it is well-controlled.  Not on cholesterol lowering medication due to side effect to statin, zetia, repatha .   - GERD: She experiences acid reflux almost daily. She is prescribed Pantoprazole  40 mg for this but does not take it due to potential side effects.    - Mood disorder: She is on citalopram  20 mg for anxiety, which she finds helpful. She had previously been on a lower dose but increased it due to heightened anxiety related to her skin condition.  - Insomnia: On Trazodone  50 mg daily.   - Allergic rhinitis: She uses Flonase  nasal spray daily for allergies, which helps prevent rhinorrhea during seasonal changes.  - Health maintenance: Qualifies for influenza, covid-19 immunization. Declines both today.   ROS As per HPI    Objective:     BP 124/70 (BP Location: Right Arm, Patient Position: Sitting, Cuff Size: Small)   Pulse 64   Temp 98.2 F (36.8 C) (Oral)   Ht 5' (1.524 m)   Wt 160 lb 3.2 oz (72.7 kg)   SpO2 96%   BMI 31.29 kg/m      05/21/2024   11:13 AM 02/20/2024    1:54 PM 11/14/2023   10:14 AM  Depression screen PHQ 2/9  Decreased Interest 1 0 0  Down, Depressed, Hopeless 0 0 0  PHQ - 2 Score 1  0 0  Altered sleeping 0 0 0  Tired, decreased energy 1 0 0  Change in appetite 0 0 0  Feeling bad or failure about yourself  0 0 0  Trouble concentrating 1 0 0  Moving slowly or fidgety/restless 0 0 0  Suicidal thoughts 0 0 0  PHQ-9 Score 3 0 0  Difficult doing  work/chores Not difficult at all Not difficult at all Not difficult at all      05/21/2024   11:13 AM 02/20/2024    1:54 PM 11/14/2023   10:14 AM 05/15/2023   11:20 AM  GAD 7 : Generalized Anxiety Score  Nervous, Anxious, on Edge 1 0 0 1  Control/stop worrying 0 0 0 1  Worry too much - different things 0 0 0 1  Trouble relaxing 0 0 0 0  Restless 0 0 0 0  Easily annoyed or irritable 1 0 0 1  Afraid - awful might happen 0 0 0 1  Total GAD 7 Score 2 0 0 5  Anxiety Difficulty  Not difficult at all Not difficult at all Somewhat difficult      05/21/2024   11:13 AM 02/20/2024    1:54 PM 11/14/2023   10:14 AM  Depression screen PHQ 2/9  Decreased Interest 1 0 0  Down, Depressed, Hopeless 0 0 0  PHQ - 2 Score 1 0 0  Altered sleeping 0 0 0  Tired, decreased energy 1 0 0  Change in appetite 0 0 0  Feeling bad or failure about yourself  0 0 0  Trouble concentrating 1 0 0  Moving slowly or fidgety/restless 0 0 0  Suicidal thoughts 0 0 0  PHQ-9 Score 3 0 0  Difficult doing work/chores Not difficult at all Not difficult at all Not difficult at all      05/21/2024   11:13 AM 02/20/2024    1:54 PM 11/14/2023   10:14 AM 05/15/2023   11:20 AM  GAD 7 : Generalized Anxiety Score  Nervous, Anxious, on Edge 1 0 0 1  Control/stop worrying 0 0 0 1  Worry too much - different things 0 0 0 1  Trouble relaxing 0 0 0 0  Restless 0 0 0 0  Easily annoyed or irritable 1 0 0 1  Afraid - awful might happen 0 0 0 1  Total GAD 7 Score 2 0 0 5  Anxiety Difficulty  Not difficult at all Not difficult at all Somewhat difficult   SDOH Screenings   Food Insecurity: No Food Insecurity (08/06/2023)  Housing: Low Risk  (08/06/2023)  Transportation Needs: No Transportation Needs (08/06/2023)  Utilities: Not At Risk (08/06/2023)  Alcohol Screen: Low Risk  (06/11/2023)  Depression (PHQ2-9): Low Risk  (05/21/2024)  Financial Resource Strain: Low Risk  (06/11/2023)  Physical Activity: Inactive (06/11/2023)  Social  Connections: Moderately Isolated (06/11/2023)  Stress: No Stress Concern Present (06/11/2023)  Tobacco Use: Medium Risk (05/21/2024)  Health Literacy: Adequate Health Literacy (06/11/2023)     Physical Exam Constitutional:      General: She is not in acute distress.    Appearance: She is not toxic-appearing.  HENT:     Head: Normocephalic.     Right Ear: Tympanic membrane normal.     Left Ear: Tympanic membrane normal.     Mouth/Throat:     Mouth: Mucous membranes are moist.     Pharynx: Oropharynx is clear.  Cardiovascular:     Rate and Rhythm: Normal rate.  Heart sounds: No murmur heard. Pulmonary:     Effort: Pulmonary effort is normal.     Breath sounds: Normal breath sounds. No wheezing.  Abdominal:     General: Bowel sounds are normal.     Palpations: Abdomen is soft.  Musculoskeletal:     Cervical back: Normal range of motion and neck supple.     Right lower leg: No edema.     Left lower leg: No edema.  Skin:    Findings: Lesion (left lower cheek, nasolibial and right cheek with erythematous, mildly ulcerated plaque with well demarcated border, no active discharge, drainange. No vesicles/bulla) present.  Neurological:     Mental Status: She is alert and oriented to person, place, and time.  Psychiatric:        Mood and Affect: Mood normal.           No results found for any visits on 05/21/24.  The ASCVD Risk score (Arnett DK, et al., 2019) failed to calculate for the following reasons:   The 2019 ASCVD risk score is only valid for ages 44 to 51   Risk score cannot be calculated because patient has a medical history suggesting prior/existing ASCVD     Assessment & Plan:   Lesion of skin of face Assessment & Plan: Chronic facial lesion that is non healing despite use of Metronidazole  gel BID. She has a h/o BCC of face. I recommend dermatology evaluation for potential biopsy of non-healing lesion. At the meantime treatment with: Metronidazole  0.75%  BID, add benzoyl peroxide  5%: a thin layer to clean, dry skin, avoiding eyes, every other day.   Orders: -     Ambulatory referral to Dermatology -     Benzoyl Peroxide ; Apply a thin layer to clean, dry skin, avoiding eyes, every other day.  Dispense: 142 g; Refill: 12  Essential hypertension Assessment & Plan: Chronic, BP within goal. Continue amlodipine  5 mg daily. Check CMP   Orders: -     amLODIPine  Besylate; Take 1 tablet (5 mg total) by mouth daily.  Dispense: 90 tablet; Refill: 3 -     Comprehensive metabolic panel with GFR; Future  Rosacea Assessment & Plan: Chronic facial lesion that is non healing despite use of Metronidazole  gel BID. She has a h/o BCC of face. I recommend dermatology evaluation for potential biopsy of non-healing lesion. At the meantime treatment with: Metronidazole  0.75% BID, add benzoyl peroxide  5%: a thin layer to clean, dry skin, avoiding eyes, every other day.   Orders: -     metroNIDAZOLE ; Apply 1 Application topically 2 (two) times daily.  Dispense: 45 g; Refill: 6  Post-nasal drip Assessment & Plan: Chronic and stable on daily nasal Flonase , continue. Refill sent  Orders: -     Fluticasone  Propionate; Place 2 sprays into both nostrils daily.  Dispense: 16 g; Refill: 6  Mood disorder Assessment & Plan: Chronic, no si/hi.  She was on Celexa  10 mg daily which she increased to 20 mg daily to help with anxiety (related to facial lesion).  Continue trazodone  50 mg nightly at night. Refill sent.   Orders: -     traZODone  HCl; TAKE ONE TABLET BY MOUTH EVERY NIGHT AT BEDTIME FOR SLEEP  Dispense: 90 tablet; Refill: 3 -     Citalopram  Hydrobromide; Take 1 tablet (20 mg total) by mouth daily.  Dispense: 90 tablet; Refill: 3  Prediabetes Assessment & Plan: Reviewed A1c from 02/20/24 in prediabetic range. Repeat A1c, lab ordered.  Orders: -  Hemoglobin A1c; Future  Meningioma [facility]) Assessment & Plan: First evident in imaging from 2021, repeat  imaging, MRI 02/23/2021 redemonstrated 7 mm partially calcified meningioma right parietal lobe. No new neurological symptoms. I will discuss this with the patient during her follow up visit. I recommend we repeat imaging for surveillance.     Statin myopathy Assessment & Plan: History of statin intolerance. Did not tolerate Zetia, Repatha  either.      Need for influenza vaccination Assessment & Plan: Patient declined.   Mixed hyperlipidemia Assessment & Plan: History of statin intolerance. Did not tolerate Zetia, Repatha  either. Check fasting lipid panel.   Orders: -     Lipid panel; Future  Gastroesophageal reflux disease, unspecified whether esophagitis present Assessment & Plan: GERD symptoms present, hesitant to take Protonix  40 mg which is prescribed to her. Long-term medication use risks discussed, benefits outweigh risks currently. Recommend she can try taking Pantoprazole /Protonix  40 mg on empty stomach every other day. Refill sent   Orders: -     Pantoprazole  Sodium; Take 1 tablet (40 mg total) by mouth daily.  Dispense: 180 tablet; Refill: 1    Return in about 3 months (around 08/20/2024) for Fasting lab after 05/23/24 and follow up with Dr. Abbey in 3-4 months.   Luke Abbey, MD

## 2024-05-21 NOTE — Assessment & Plan Note (Signed)
 GERD symptoms present, hesitant to take Protonix  40 mg which is prescribed to her. Long-term medication use risks discussed, benefits outweigh risks currently. Recommend she can try taking Pantoprazole /Protonix  40 mg on empty stomach every other day. Refill sent

## 2024-05-21 NOTE — Assessment & Plan Note (Signed)
 History of statin intolerance. Did not tolerate Zetia, Repatha  either. Check fasting lipid panel.

## 2024-05-21 NOTE — Assessment & Plan Note (Signed)
Patient declined  

## 2024-05-21 NOTE — Assessment & Plan Note (Signed)
 Chronic, no si/hi.  She was on Celexa  10 mg daily which she increased to 20 mg daily to help with anxiety (related to facial lesion).  Continue trazodone  50 mg nightly at night. Refill sent.

## 2024-05-28 ENCOUNTER — Other Ambulatory Visit

## 2024-05-28 ENCOUNTER — Other Ambulatory Visit (INDEPENDENT_AMBULATORY_CARE_PROVIDER_SITE_OTHER)

## 2024-05-28 DIAGNOSIS — R5383 Other fatigue: Secondary | ICD-10-CM | POA: Diagnosis not present

## 2024-05-28 DIAGNOSIS — E782 Mixed hyperlipidemia: Secondary | ICD-10-CM | POA: Diagnosis not present

## 2024-05-28 DIAGNOSIS — I10 Essential (primary) hypertension: Secondary | ICD-10-CM

## 2024-05-28 DIAGNOSIS — R7303 Prediabetes: Secondary | ICD-10-CM

## 2024-05-28 NOTE — Addendum Note (Signed)
 Addended by: MARYLEN PRO A on: 05/28/2024 08:56 AM   Modules accepted: Orders

## 2024-05-29 ENCOUNTER — Ambulatory Visit: Payer: Self-pay

## 2024-05-29 LAB — COMPREHENSIVE METABOLIC PANEL WITH GFR
ALT: 13 IU/L (ref 0–32)
AST: 22 IU/L (ref 0–40)
Albumin: 4.3 g/dL (ref 3.7–4.7)
Alkaline Phosphatase: 64 IU/L (ref 48–129)
BUN/Creatinine Ratio: 19 (ref 12–28)
BUN: 15 mg/dL (ref 8–27)
Bilirubin Total: 0.4 mg/dL (ref 0.0–1.2)
CO2: 23 mmol/L (ref 20–29)
Calcium: 10 mg/dL (ref 8.7–10.3)
Chloride: 102 mmol/L (ref 96–106)
Creatinine, Ser: 0.81 mg/dL (ref 0.57–1.00)
Globulin, Total: 2.4 g/dL (ref 1.5–4.5)
Glucose: 94 mg/dL (ref 70–99)
Potassium: 4.7 mmol/L (ref 3.5–5.2)
Sodium: 143 mmol/L (ref 134–144)
Total Protein: 6.7 g/dL (ref 6.0–8.5)
eGFR: 73 mL/min/1.73 (ref >59)

## 2024-05-29 LAB — LIPID PANEL
Chol/HDL Ratio: 5.1 ratio — ABNORMAL HIGH (ref 0.0–4.4)
Cholesterol, Total: 274 mg/dL — ABNORMAL HIGH (ref 100–199)
HDL: 54 mg/dL (ref >39)
LDL Chol Calc (NIH): 180 mg/dL — ABNORMAL HIGH (ref 0–99)
Triglycerides: 213 mg/dL — ABNORMAL HIGH (ref 0–149)
VLDL Cholesterol Cal: 40 mg/dL (ref 5–40)

## 2024-05-29 LAB — TSH: TSH: 3.36 u[IU]/mL (ref 0.450–4.500)

## 2024-05-29 LAB — HEMOGLOBIN A1C
Est. average glucose Bld gHb Est-mCnc: 111 mg/dL
Hgb A1c MFr Bld: 5.5 % (ref 4.8–5.6)

## 2024-05-29 NOTE — Progress Notes (Signed)
 Please let the patient know I reviewed her lab results (last mychart login was in June)   Her cholesterol is elevated from baseline, her LDL or bad cholesterol is 819. She has a history of statin, zetia, Repatha  intolerance. I recommend she follows up with cardiology to optimize cardiovascular risk related to elevated bad cholesterol or LDL cholesterol.   Her A1c looks stable. Kidney, liver function looks good. I will update her once I have result on TSH.   Thank you,  Luke Shade, MD

## 2024-05-30 ENCOUNTER — Telehealth: Payer: Self-pay

## 2024-05-30 NOTE — Telephone Encounter (Signed)
 Copied from CRM [id number]. Topic: General - Call Back - No Documentation >> May 29, 2024  4:53 PM Rea ORN wrote: Reason for CRM: Pt returned missed call from clinic. There was no recent encounter to advise her of and she said she had already received her lab results earlier.  Pt mentioned she had a referral for Dermatology. It looks like they called her but the referral is now closed. Please call back

## 2024-06-11 ENCOUNTER — Ambulatory Visit: Payer: Medicare Other

## 2024-06-11 VITALS — Ht 60.0 in | Wt 160.0 lb

## 2024-06-11 DIAGNOSIS — Z1231 Encounter for screening mammogram for malignant neoplasm of breast: Secondary | ICD-10-CM

## 2024-06-11 DIAGNOSIS — Z0000 Encounter for general adult medical examination without abnormal findings: Secondary | ICD-10-CM | POA: Diagnosis not present

## 2024-06-11 NOTE — Progress Notes (Signed)
 Subjective:   Sharon Mcclure is a 83 y.o. who presents for a Medicare Wellness preventive visit.  As a reminder, Annual Wellness Visits don't include a physical exam, and some assessments may be limited, especially if this visit is performed virtually. We may recommend an in-person follow-up visit with your provider if needed.  Visit Complete: Virtual I connected with  Sharon Mcclure on 06/11/24 by a audio enabled telemedicine application and verified that I am speaking with the correct person using two identifiers.  Patient Location: Home  Provider Location: Home Office  I discussed the limitations of evaluation and management by telemedicine. The patient expressed understanding and agreed to proceed.  Vital Signs: Because this visit was a virtual/telehealth visit, some criteria may be missing or patient reported. Any vitals not documented were not able to be obtained and vitals that have been documented are patient reported.  VideoDeclined- This patient declined Librarian, academic. Therefore the visit was completed with audio only.  Persons Participating in Visit: Patient.  AWV Questionnaire: No: Patient Medicare AWV questionnaire was not completed prior to this visit.  Cardiac Risk Factors include: advanced age (>70men, >62 women);dyslipidemia;hypertension;obesity (BMI >30kg/m2)     Objective:    Today's Vitals   06/11/24 1551  Weight: 160 lb (72.6 kg)  Height: 5' (1.524 m)   Body mass index is 31.25 kg/m.     06/11/2024    4:03 PM 08/05/2023    5:40 AM 06/11/2023    3:27 PM 06/06/2022   10:53 AM 05/18/2021   10:47 AM 02/22/2021    5:45 PM 05/17/2020   10:47 AM  Advanced Directives  Does Patient Have a Medical Advance Directive? No No Yes Yes No  Yes  Type of Surveyor, minerals;Living will Healthcare Power of [place];Living will  Healthcare Power of [place];Living will Healthcare Power of [place];Living  will  Does patient want to make changes to medical advance directive?    No - Patient declined  No - Patient declined No - Patient declined  Copy of Healthcare Power of Attorney in Chart?   No - copy requested No - copy requested  No - copy requested No - copy requested  Would patient like information on creating a medical advance directive? No - Patient declined No - Patient declined   No - Patient declined      Current Medications (verified) Outpatient Encounter Medications as of 06/11/2024  Medication Sig   amLODipine  (NORVASC ) 5 MG tablet Take 1 tablet (5 mg total) by mouth daily.   benzoyl peroxide  5 % external liquid Apply a thin layer to clean, dry skin, avoiding eyes, every other day.   citalopram  (CELEXA ) 20 MG tablet Take 1 tablet (20 mg total) by mouth daily.   fluticasone  (FLONASE ) 50 MCG/ACT nasal spray Place 2 sprays into both nostrils daily.   metroNIDAZOLE  (METROGEL ) 0.75 % gel Apply 1 Application topically 2 (two) times daily.   Multiple Vitamin (MULTIVITAMIN) capsule Take 1 capsule by mouth daily.   pantoprazole  (PROTONIX ) 40 MG tablet Take 1 tablet (40 mg total) by mouth daily.   traZODone  (DESYREL ) 50 MG tablet TAKE ONE TABLET BY MOUTH EVERY NIGHT AT BEDTIME FOR SLEEP   No facility-administered encounter medications on file as of 06/11/2024.    Allergies (verified) Ampicillin, Penicillins, Crestor [rosuvastatin calcium ], Ezetimibe-simvastatin, Niaspan [niacin er (antihyperlipidemic)], Vytorin [ezetimibe-simvastatin], Zetia [ezetimibe], Zocor [simvastatin], Fenofibrate , and Welchol [colesevelam hcl]   History: Past Medical History:  Diagnosis Date  Abnormal EKG 05/29/2019   Acute hypoxic respiratory failure (HCC) 08/06/2023   Acute respiratory failure with hypoxia (HCC) 08/05/2023   Annual physical exam 10/13/2020   Anxiety    CAD (coronary artery disease)    Cancer (HCC)    basal cell-right nostril   Compression fracture of L3 vertebra (HCC) 10/15/2020    Coronary artery calcification 05/29/2019   COVID-19    09/15/21   Edema 12/08/2021   Fatty liver    GERD (gastroesophageal reflux disease)    Heart murmur    Hemorrhoids    Hiatal hernia    History of skin cancer    Right arm   HTN (hypertension)    Hyperlipidemia    Insomnia    Insomnia    Left groin pain 02/20/2011   Osteoarthritis of finger of right hand 01/29/2017   Overactive bladder 10/13/2020   Pain in thoracic spine 01/08/2015   Compression fracture at T9 -T11   Pneumonia 05/2015   Pneumonia 06/27/2015   PVC's (premature ventricular contractions) 09/23/2020   Rosacea 11/24/2013   Stroke determined by clinical assessment (HCC) 02/23/2021   Stroke-like episode 02/23/2021   Thoracic compression fracture (HCC) 01/08/2015   T9-11   Vitamin B 12 deficiency 11/20/2023   Vitamin D  deficiency    Vitamin D  deficiency 09/29/2013   Past Surgical History:  Procedure Laterality Date   APPENDECTOMY     BASAL CELL CARCINOMA EXCISION     right nostril   CATARACT EXTRACTION W/PHACO Left 01/25/2016   Procedure: CATARACT EXTRACTION PHACO AND INTRAOCULAR LENS PLACEMENT (IOC);  Surgeon: Elsie Carmine, MD;  Location: [facility] ORS;  Service: Ophthalmology;  Laterality: Left;  US  33.9 AP% 15.5 CDE 5.28 Fluid Pack Lot # [id number]   CATARACT EXTRACTION W/PHACO Right 02/22/2016   Procedure: CATARACT EXTRACTION PHACO AND INTRAOCULAR LENS PLACEMENT (IOC);  Surgeon: Elsie Carmine, MD;  Location: [facility] ORS;  Service: Ophthalmology;  Laterality: Right;  US  00:40 AP% 23.6 CDE 9.45 fluid pack lot # [id number]   MOHS SURGERY     bcc right nasal ala 01/2020   TUBAL LIGATION  1981   Family History  Problem Relation Age of Onset   Heart disease Mother 62       aortic valve- had AVR- died 6 wks after   Hyperlipidemia Mother    Aneurysm Father 74       aortic aneursym   Heart disease Father    Heart disease Brother 56       CABG   Colon cancer Neg Hx    Stomach cancer Neg Hx    Breast cancer  Neg Hx    Social History   Socioeconomic History   Marital status: Single    Spouse name: Not on file   Number of children: Not on file   Years of education: Not on file   Highest education level: Not on file  Occupational History   Not on file  Tobacco Use   Smoking status: Former    Current packs/day: 0.00    Average packs/day: 0.3 packs/day for 22.0 years (5.5 ttl pk-yrs)    Types: Cigarettes    Start date: 64    Quit date: 67    Years since quitting: 45.8   Smokeless tobacco: Never  Vaping Use   Vaping status: Never Used  Substance and Sexual Activity   Alcohol use: No   Drug use: No   Sexual activity: Not on file  Other Topics Concern   Not on file  Social History  Narrative   Retired Scientist, 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    1 daughter    [place] up on a farm    Social Drivers of Corporate investment banker Strain: Low Risk  (06/11/2024)   Overall Financial Resource Strain (CARDIA)    Difficulty of Paying Living Expenses: Not hard at all  Food Insecurity: No Food Insecurity (06/11/2024)   Hunger Vital Sign    Worried About Running Out of Food in the Last Year: Never true    Ran Out of Food in the Last Year: Never true  Transportation Needs: No Transportation Needs (06/11/2024)   PRAPARE - Administrator, Civil Service (Medical): No    Lack of Transportation (Non-Medical): No  Physical Activity: Inactive (06/11/2024)   Exercise Vital Sign    Days of Exercise per Week: 0 days    Minutes of Exercise per Session: 0 min  Stress: No Stress Concern Present (06/11/2024)   [employer] of Occupational Health - Occupational Stress Questionnaire    Feeling of Stress: Not at all  Social Connections: Moderately Isolated (06/11/2024)   Social Connection and Isolation Panel    Frequency of Communication with Friends and Family: More than three times a week    Frequency of Social Gatherings with Friends and Family: Three times a week    Attends Religious Services:  More than 4 times per year    Active Member of Clubs or Organizations: No    Attends Banker Meetings: Never    Marital Status: Divorced    Tobacco Counseling Counseling given: Not Answered    Clinical Intake:  Pre-visit preparation completed: Yes  Pain : No/denies pain     BMI - recorded: 31.25 Nutritional Status: BMI > 30  Obese Nutritional Risks: None Diabetes: No  Lab Results  Component Value Date   HGBA1C 5.5 05/28/2024   HGBA1C 6.0 02/20/2024   HGBA1C 5.8 10/18/2022     How often do you need to have someone help you when you read instructions, pamphlets, or other written materials from your doctor or pharmacy?: 1 - Never  Interpreter Needed?: No  Information entered by :: R. Takiesha Mcdevitt LPN   Activities of Daily Living     06/11/2024    3:52 PM 08/06/2023    3:00 PM  In your present state of health, do you have any difficulty performing the following activities:  Hearing? 0 0  Vision? 0 0  Difficulty concentrating or making decisions? 0 0  Walking or climbing stairs? 0   Dressing or bathing? 0   Doing errands, shopping? 1 0  Preparing Food and eating ? N   Using the Toilet? N   In the past six months, have you accidently leaked urine? N   Do you have problems with loss of bowel control? N   Managing your Medications? N   Managing your Finances? N   Housekeeping or managing your Housekeeping? N     Patient Care Team: Bair, Kalpana, MD as PCP - General (Family Medicine) End, Lonni, MD as PCP - Cardiology (Cardiology)  I have updated your Care Teams any recent Medical Services you may have received from other providers in the past year.     Assessment:   This is a routine wellness examination for [place].  Hearing/Vision screen Hearing Screening - Comments:: No issues Vision Screening - Comments:: glasses   Goals Addressed             This Visit's Progress  Patient Stated       Wants to Mcclure active       Depression  Screen     06/11/2024    3:58 PM 05/21/2024   11:13 AM 02/20/2024    1:54 PM 11/14/2023   10:14 AM 06/11/2023    3:21 PM 05/15/2023   11:19 AM 10/18/2022    8:42 AM  PHQ 2/9 Scores  PHQ - 2 Score 0 1 0 0 0 1 0  PHQ- 9 Score 0 3 0 0 0 3     Fall Risk     06/11/2024    3:55 PM 05/21/2024   11:13 AM 02/20/2024    1:54 PM 06/11/2023    3:19 PM 05/15/2023   11:19 AM  Fall Risk   Falls in the past year? 0 0 0 0 1  Number falls in past yr: 0 0 0 0 1  Injury with Fall? 0 0 0 0 1  Risk for fall due to : No Fall Risks No Fall Risks No Fall Risks No Fall Risks   Follow up Falls evaluation completed;Falls prevention discussed Falls evaluation completed Falls evaluation completed Falls prevention discussed;Falls evaluation completed Falls evaluation completed;Education provided    MEDICARE RISK AT HOME:  Medicare Risk at Home Any stairs in or around the home?: No If so, are there any without handrails?: No Home free of loose throw rugs in walkways, pet beds, electrical cords, etc?: Yes Adequate lighting in your home to reduce risk of falls?: Yes Life alert?: Yes Use of a cane, walker or w/c?: No Grab bars in the bathroom?: Yes Shower chair or bench in shower?: Yes Elevated toilet seat or a handicapped toilet?: Yes  TIMED UP AND GO:  Was the test performed?  No  Cognitive Function: 6CIT completed        06/11/2024    4:04 PM 06/11/2023    3:27 PM 06/06/2022   10:59 AM 05/17/2020   10:51 AM 05/15/2019   10:58 AM  6CIT Screen  What Year? 0 points 0 points 0 points 0 points 0 points  What month? 0 points 0 points 0 points 0 points 0 points  What time? 0 points 0 points 0 points 0 points 0 points  Count back from 20 0 points 0 points 0 points 0 points 0 points  Months in reverse 0 points 0 points 0 points 0 points 0 points  Repeat phrase 0 points 0 points 2 points 2 points 0 points  Total Score 0 points 0 points 2 points 2 points 0 points    Immunizations Immunization History   Administered Date(s) Administered   Fluad Quad(high Dose 65+) 06/02/2019   Fluad Trivalent(High Dose 65+) 05/15/2023   INFLUENZA, HIGH DOSE SEASONAL PF 06/01/2014, 04/26/2020   Influenza Whole 05/29/2007   Influenza,inj,Quad PF,6+ Mos 06/29/2015, 05/13/2018, 06/01/2022   Influenza-Unspecified 05/29/2007, 05/28/2013, 06/01/2014, 05/28/2021   PFIZER(Purple Top)SARS-COV-2 Vaccination 09/26/2019, 10/17/2019   PNEUMOCOCCAL CONJUGATE-20 06/28/2022   Pneumococcal Conjugate-13 04/22/2014   Pneumococcal Polysaccharide-23 06/12/2005, 09/13/2015   Pneumococcal-Unspecified 04/22/2014   Tdap 04/07/2019   Zoster Recombinant(Shingrix) 03/06/2019, 03/08/2019, 05/21/2019   Zoster, Live 10/08/2007    Screening Tests Health Maintenance  Topic Date Due   COVID-19 Vaccine (3 - Pfizer risk series) 11/14/2019   Medicare Annual Wellness (AWV)  06/10/2024   Mammogram  08/27/2024 (Originally 06/29/2023)   Influenza Vaccine  11/25/2024 (Originally 03/28/2024)   DTaP/Tdap/Td (2 - Td or Tdap) 04/06/2029   Pneumococcal Vaccine: 50+ Years  Completed   DEXA  SCAN  Completed   Zoster Vaccines- Shingrix  Completed   Meningococcal B Vaccine  Aged Out   Hepatitis C Screening  Discontinued    Health Maintenance Items Addressed: Mammogram ordered Patient declines flu and covid vaccines  Additional Screening:  Vision Screening: Recommended annual ophthalmology exams for early detection of glaucoma and other disorders of the eye. Is the patient up to date with their annual eye exam?  Yes  Who is the provider or what is the name of the office in which the patient attends annual eye exams?  [facility]   Dental Screening: Recommended annual dental exams for proper oral hygiene  Community Resource Referral / Chronic Care Management: CRR required this visit?  No   CCM required this visit?  No   Plan:    I have personally reviewed and noted the following in the patient's chart:   Medical and social  history Use of alcohol, tobacco or illicit drugs  Current medications and supplements including opioid prescriptions. Patient is not currently taking opioid prescriptions. Functional ability and status Nutritional status Physical activity Advanced directives List of other physicians Hospitalizations, surgeries, and ER visits in previous 12 months Vitals Screenings to include cognitive, depression, and falls Referrals and appointments  In addition, I have reviewed and discussed with patient certain preventive protocols, quality metrics, and best practice recommendations. A written personalized care plan for preventive services as well as general preventive health recommendations were provided to patient.   Angeline Fredericks, LPN   [id number]   After Visit Summary: (MyChart) Due to this being a telephonic visit, the after visit summary with patients personalized plan was offered to patient via MyChart   Notes: Nothing significant to report at this time.

## 2024-06-11 NOTE — Patient Instructions (Signed)
 [name],  Thank you for taking the time for your Medicare Wellness Visit. I appreciate your continued commitment to your health goals. Please review the care plan we discussed, and feel free to reach out if I can assist you further.  Medicare recommends these wellness visits once per year to help you and your care team stay ahead of potential health issues. These visits are designed to focus on prevention, allowing your provider to concentrate on managing your acute and chronic conditions during your regular appointments.  Please note that Annual Wellness Visits do not include a physical exam. Some assessments may be limited, especially if the visit was conducted virtually. If needed, we may recommend a separate in-person follow-up with your provider.  Ongoing Care Seeing your primary care provider every 3 to 6 months helps us  monitor your health and provide consistent, personalized care.  Consider updating your flu vaccine. You have an order for:  []   2D Mammogram  [x]   3D Mammogram  []   Bone Density     Please call for appointment:  [facility]  [address] [place]cl[place] [phone]  Make sure to wear two-piece clothing.  No lotions, powders, or deodorants the day of the appointment. Make sure to bring picture ID and insurance card.  Bring list of medications you are currently taking including any supplements.    Referrals If a referral was made during today's visit and you haven't received any updates within two weeks, please contact the referred provider directly to check on the status.  Recommended Screenings:  Health Maintenance  Topic Date Due   COVID-19 Vaccine (3 - Pfizer risk series) 11/14/2019   Breast Cancer Screening  08/27/2024*   Flu Shot  11/25/2024*   Medicare Annual Wellness Visit  06/11/2025   DTaP/Tdap/Td vaccine (2 - Td or Tdap) 04/06/2029   Pneumococcal Vaccine for age over 62  Completed    DEXA scan (bone density measurement)  Completed   Zoster (Shingles) Vaccine  Completed   Meningitis B Vaccine  Aged Out   Hepatitis C Screening  Discontinued  *Topic was postponed. The date shown is not the original due date.       06/11/2024    4:03 PM  Advanced Directives  Does Patient Have a Medical Advance Directive? No  Would patient like information on creating a medical advance directive? No - Patient declined   Advance Care Planning is important because it: Ensures you receive medical care that aligns with your values, goals, and preferences. Provides guidance to your family and loved ones, reducing the emotional burden of decision-making during critical moments.  Vision: Annual vision screenings are recommended for early detection of glaucoma, cataracts, and diabetic retinopathy. These exams can also reveal signs of chronic conditions such as diabetes and high blood pressure.  Dental: Annual dental screenings help detect early signs of oral cancer, gum disease, and other conditions linked to overall health, including heart disease and diabetes.  Please see the attached documents for additional preventive care recommendations.

## 2024-07-02 ENCOUNTER — Ambulatory Visit

## 2024-07-02 DIAGNOSIS — F424 Excoriation (skin-picking) disorder: Secondary | ICD-10-CM | POA: Diagnosis not present

## 2024-07-02 DIAGNOSIS — T148XXA Other injury of unspecified body region, initial encounter: Secondary | ICD-10-CM

## 2024-07-02 DIAGNOSIS — S0081XA Abrasion of other part of head, initial encounter: Secondary | ICD-10-CM | POA: Diagnosis not present

## 2024-07-02 MED ORDER — MUPIROCIN 2 % EX OINT
TOPICAL_OINTMENT | CUTANEOUS | 1 refills | Status: AC
Start: 2024-07-02 — End: ?

## 2024-07-02 NOTE — Progress Notes (Signed)
    Subjective   Sharon Mcclure is a 83 y.o. female who presents for the following: Lesion(s) of concern . Patient is new patient  Today patient reports: Lesions at face, was being treated for rosacea prescribed metronidazole  using once a day, patient reports lesions have made jaw sore, patient does report she does pick at skin. Hx skin cancer basal cell carcinoma several years ago.   States frequently picks at spot and pulls of skin/small cysts.   This patient is accompanied in the office by her daughter.   Review of Systems:    No other skin or systemic complaints except as noted in HPI or Assessment and Plan.  The following portions of the chart were reviewed this encounter and updated as appropriate: medications, allergies, medical history  Relevant Medical History:  Personal history of non melanoma skin cancer - see medical history for full details   Objective  Well appearing patient in no apparent distress; mood and affect are within normal limits. Examination was performed of the: Focused Exam of: Face   Examination notable for: excoriated plaques of l and r cheek  Examination limited by: Undergarments, Shoes or socks , Clothing, and Patient deferred removal          Assessment & Plan   Excoriated lesions of face iso skin picking  Edu: Advised to stop picking. Counseled about behavioral techniques - Start mupirocin  2% ointment 1-2 times daily to crusted areas until clear - Recommend covering with OTC hydrocolloid bandages. - Recheck in 6 weeks     Level of service outlined above   Procedures, orders, diagnosis for this visit:    There are no diagnoses linked to this encounter.  Return to clinic: Return in about 6 weeks (around 08/13/2024) for w/ Dr. Raymund.  I, Jacquelynn V. Wilfred, CMA, am acting as scribe for Lauraine JAYSON Raymund, MD.  Documentation: I have reviewed the above documentation for accuracy and completeness, and I agree with the above.  Lauraine JAYSON Raymund, MD

## 2024-07-02 NOTE — Patient Instructions (Addendum)

## 2024-08-13 ENCOUNTER — Ambulatory Visit

## 2024-08-13 DIAGNOSIS — F424 Excoriation (skin-picking) disorder: Secondary | ICD-10-CM

## 2024-08-13 MED ORDER — MUPIROCIN 2 % EX OINT: TOPICAL_OINTMENT | CUTANEOUS | 2 refills | Status: AC

## 2024-08-13 NOTE — Patient Instructions (Addendum)
 OTC hydrocolloid bandages.   - Continue mupirocin  2% ointment 1-2 times daily to crusted areas until clear and cover with a band-aid.    Due to recent changes in healthcare laws, you may see results of your pathology and/or laboratory studies on MyChart before the doctors have had a chance to review them. We understand that in some cases there may be results that are confusing or concerning to you. Please understand that not all results are received at the same time and often the doctors may need to interpret multiple results in order to provide you with the best plan of care or course of treatment. Therefore, we ask that you please give us  2 business days to thoroughly review all your results before contacting the office for clarification. Should we see a critical lab result, you will be contacted sooner.   If You Need Anything After Your Visit  If you have any questions or concerns for your doctor, please call our main line at [phone] and press option 4 to reach your doctor's medical assistant. If no one answers, please leave a voicemail as directed and we will return your call as soon as possible. Messages left after 4 pm will be answered the following business day.   You may also send us  a message via MyChart. We typically respond to MyChart messages within 1-2 business days.  For prescription refills, please ask your pharmacy to contact our office. Our fax number is [phone].  If you have an urgent issue when the clinic is closed that cannot wait until the next business day, you can page your doctor at the number below.    Please note that while we do our best to be available for urgent issues outside of office hours, we are not available 24/7.   If you have an urgent issue and are unable to reach us , you may choose to seek medical care at your doctor's office, retail clinic, urgent care center, or emergency room.  If you have a medical emergency, please immediately call 911 or  go to the emergency department.  Pager Numbers  - Dr. Hester: [phone]  - Dr. Jackquline: [phone]  - Dr. Claudene: [phone]   In the event of inclement weather, please call our main line at [phone] for an update on the status of any delays or closures.  Dermatology Medication Tips: Please keep the boxes that topical medications come in in order to help keep track of the instructions about where and how to use these. Pharmacies typically print the medication instructions only on the boxes and not directly on the medication tubes.   If your medication is too expensive, please contact our office at [phone] option 4 or send us  a message through MyChart.   We are unable to tell what your co-pay for medications will be in advance as this is different depending on your insurance coverage. However, we may be able to find a substitute medication at lower cost or fill out paperwork to get insurance to cover a needed medication.   If a prior authorization is required to get your medication covered by your insurance company, please allow us  1-2 business days to complete this process.  Drug prices often vary depending on where the prescription is filled and some pharmacies may offer cheaper prices.  The website www.goodrx.com contains coupons for medications through different pharmacies. The prices here do not account for what the cost may be with help from insurance (it may be cheaper with your insurance),  but the website can give you the price if you did not use any insurance.  - You can print the associated coupon and take it with your prescription to the pharmacy.  - You may also stop by our office during regular business hours and pick up a GoodRx coupon card.  - If you need your prescription sent electronically to a different pharmacy, notify our office through [facility] or by phone at [phone] option 4.     Si Usted Necesita Algo Despus de Su  Visita  Tambin puede enviarnos un mensaje a travs de Clinical Cytogeneticist. Por lo general respondemos a los mensajes de MyChart en el transcurso de 1 a 2 das hbiles.  Para renovar recetas, por favor pida a su farmacia que se ponga en contacto con nuestra oficina. Randi lakes de fax es [place] [phone].  Si tiene un asunto urgente cuando la clnica est cerrada y que no puede esperar hasta el siguiente da hbil, puede llamar/localizar a su doctor(a) al nmero que aparece a continuacin.   Por favor, tenga en cuenta que aunque hacemos todo lo posible para estar disponibles para asuntos urgentes fuera del horario de [place], no estamos disponibles las 24 horas del da, los 7 [address]  [phone] de la [place].   Si tiene un problema urgente y no puede comunicarse con nosotros, puede optar por buscar atencin mdica  en el consultorio de su doctor(a), en una clnica privada, en un centro de atencin urgente o en una sala de emergencias.  Si tiene engineer, drilling, por favor llame inmediatamente al 911 o vaya a la sala de emergencias.  Nmeros de bper  - Dr. Hester: [phone]  - Dra. Jackquline: [phone]  - Dr. Claudene: [phone]   En caso de inclemencias del tiempo, por favor llame a landry capes principal al [phone] para una actualizacin sobre el [place] de cualquier retraso o cierre.  Consejos para la medicacin en dermatologa: Por favor, guarde las cajas en las que vienen los medicamentos de uso tpico para ayudarle a seguir las instrucciones sobre dnde y cmo usarlos. Las farmacias generalmente imprimen las instrucciones del medicamento slo en las cajas y no directamente en los tubos del [place].   Si su medicamento es muy caro, por favor, pngase en contacto con landry rieger llamando al [phone] y presione la opcin 4 o envenos un mensaje a travs de Clinical Cytogeneticist.   No podemos decirle cul ser su copago por los medicamentos por adelantado ya que esto es diferente dependiendo de  la cobertura de su seguro. Sin embargo, es posible que podamos encontrar un medicamento sustituto a audiological scientist un formulario para que el seguro cubra el medicamento que se considera necesario.   Si se requiere una autorizacin previa para que su compaa de seguros cubra su medicamento, por favor permtanos de 1 a 2 das hbiles para completar este proceso.  Los precios de los medicamentos varan con frecuencia dependiendo del environmental consultant de dnde se surte la receta y alguna farmacias pueden ofrecer precios ms baratos.  El sitio web www.goodrx.com tiene cupones para medicamentos de health and safety inspector. Los precios aqu no tienen en cuenta lo que podra costar con la ayuda del seguro (puede ser ms barato con su seguro), pero el sitio web puede darle el precio si no utiliz tourist information centre manager.  - Puede imprimir el cupn correspondiente y llevarlo con su receta a la farmacia.  - Tambin puede pasar por nuestra oficina durante el horario de atencin regular y education officer, museum una tarjeta de  cupones de GoodRx.  - Si necesita que su receta se enve electrnicamente a una farmacia diferente, informe a nuestra oficina a travs de MyChart de [place] o por telfono llamando al [phone] y presione la opcin 4.

## 2024-08-13 NOTE — Progress Notes (Signed)
°  °  Subjective   Sharon Mcclure is a 83 y.o. female who presents for the following: Follow up of Excoriated lesions of face. Patient is established patient   Today patient reports: Patient states she has been using mupricin and has been getting better. Has been having new spots appear on face.  This patient is accompanied in the office by her daughter.   Review of Systems:    No other skin or systemic complaints except as noted in HPI or Assessment and Plan.  The following portions of the chart were reviewed this encounter and updated as appropriate: medications, allergies, medical history  Relevant Medical History:  n/a   Objective  (SKPE) Well appearing patient in no apparent distress; mood and affect are within normal limits. Examination was performed of the: Focused Exam of: face   Examination notable for: excoriated plaques of forehead, nose  Examination limited by: Undergarments, Shoes or socks , and Clothing     Assessment & Plan  (SKAP)   Excoriated lesions of face iso skin picking  Edu: Advised to stop picking. Counseled about behavioral techniques - Continue mupirocin  2% ointment 1-2 times daily to crusted areas until clear and cover with a band-aid. - Recommend covering with OTC hydrocolloid bandages. - advised to keep lesions covered at all time w bandage or vaseline  - Recommended keeping nails short, using gloves at night  - Discussed NAC, gabapentin - defers for now as has issues with medications   Was sun protection counseling provided?: Yes   Level of service outlined above   Patient instructions (SKPI)   Procedures, orders, diagnosis for this visit:    There are no diagnoses linked to this encounter.  Return to clinic: Return if symptoms worsen or fail to improve, for w/ Dr. Raymund.  I, Almetta Nora, RMA, am acting as scribe for Lauraine JAYSON Raymund, MD .   Documentation: I have reviewed the above documentation for accuracy and completeness, and I  agree with the above.  Lauraine JAYSON Raymund, MD

## 2024-09-17 ENCOUNTER — Ambulatory Visit

## 2024-09-17 VITALS — BP 120/70 | HR 62 | Temp 98.5°F | Ht 60.0 in | Wt 165.6 lb

## 2024-09-17 DIAGNOSIS — R7303 Prediabetes: Secondary | ICD-10-CM

## 2024-09-17 DIAGNOSIS — Z79899 Other long term (current) drug therapy: Secondary | ICD-10-CM

## 2024-09-17 DIAGNOSIS — L989 Disorder of the skin and subcutaneous tissue, unspecified: Secondary | ICD-10-CM | POA: Diagnosis not present

## 2024-09-17 DIAGNOSIS — D329 Benign neoplasm of meninges, unspecified: Secondary | ICD-10-CM | POA: Diagnosis not present

## 2024-09-17 DIAGNOSIS — E782 Mixed hyperlipidemia: Secondary | ICD-10-CM | POA: Diagnosis not present

## 2024-09-17 DIAGNOSIS — F39 Unspecified mood [affective] disorder: Secondary | ICD-10-CM | POA: Diagnosis not present

## 2024-09-17 NOTE — Assessment & Plan Note (Addendum)
 History of GERD managed with Protonix  40 mg daily.  Continue.  Will check B12 in the future. Orders:   B12; Future

## 2024-09-17 NOTE — Assessment & Plan Note (Addendum)
 LDL 180 mg/dL, triglycerides slightly elevated, previous intolerance to statins, Zetia, Repatha . Patient accepts risks including MI, stroke due to medication intolerance. Continue intermittent monitoring cholesterol levels. Advised dietary modifications to reduce fats and sugars. Orders:   Lipid panel; Future

## 2024-09-17 NOTE — Assessment & Plan Note (Addendum)
 Continue intermittent monitoring blood glucose levels.  No threshold for progression into diabetes.  Continue healthy lifestyle modification (low carbohydrate, sugar, processed food).  Orders:   HgB A1c; Future

## 2024-09-17 NOTE — Assessment & Plan Note (Signed)
 Continue Bactroban  2%, Aquaphor as recommended by dermatologist.  Advised wearing gloves to prevent touching the lesion.

## 2024-09-17 NOTE — Assessment & Plan Note (Addendum)
 Chronic, anxiety predominant.  No si/hi.  Continue Celexa  20 mg daily and  trazodone  50 mg nightly to help with sleep.

## 2024-09-17 NOTE — Assessment & Plan Note (Addendum)
 MRI brain, 02/23/2021: 7 mm partially calcified meningioma overlying the right parietal lobe. Patient asymptomatic. Recommend repeat MRI for follow up, ordered. If change in size will refer her to neurosurgery for further evaluation and management. Advised to report any new symptoms such as vision changes, headaches, or nausea. Orders:   MR Brain Wo Contrast; Future

## 2024-09-17 NOTE — Patient Instructions (Signed)
 I ordered MRI of brain to follow up on Meningioma. You will get a phone call to schedule for an appointment. If this looks stable, we will continue to monitor. If any change in size or you develop headache, vision change please reach out to us  and we will refer you to a neurosurgeon.

## 2024-09-17 NOTE — Progress Notes (Signed)
 "  Established Patient Office Visit   Subjective  Patient ID: Sharon Mcclure, female    DOB: 29-Jun-1941  Age: 84 y.o. MRN: [id number]  Chief Complaint  Patient presents with   Hypertension   Hyperlipidemia   Gastroesophageal Reflux   Mood     Discussed the use of AI scribe software for clinical note transcription with the patient, who gave verbal consent to proceed.  History of Present Illness Sharon Mcclure is an 84 year old female who presents for follow-up visit.   She has a history of facial skin lesion exacerbated by skin picking habit.  She has seen dermatologist for this.  Her last visit on 08/13/2024 with dermatologist reviewed, recommended Bactroban  2%, Aquaphor and counseling against skin picking.  She has a history of meningioma, discovered incidentally on an MRI in 2022. The lesion measures about 7 mm and is calcified.  No headache, vision change.  Due for repeat MRI for follow-up.  She has history of mixed hyperlipidemia intolerant to multiple cholesterol-lowering medication.  Not interested in starting any medication. She has tried various medications including Crestor, simvastatin, Zetia, and Repatha , but has not tolerated them well. She manages her diet by watching her intake of fats and sugars.   Has a history of prediabetes.  Excellent A1c evident on October lab.  She continues to take citalopram  20 mg daily and trazodone  50 mg at night. Her mood is 'good' considering her circumstances. No thoughts of self-harm or harm to others.  No shortness of breath, chest pain, leg swelling, constipation, diarrhea, hearing problems, or falls.    ROS As per HPI    Objective:     BP 120/70   Pulse 62   Temp 98.5 F (36.9 C) (Oral)   Ht 5' (1.524 m)   Wt 165 lb 9.6 oz (75.1 kg)   SpO2 97%   BMI 32.34 kg/m      09/17/2024    9:40 AM 06/11/2024    3:58 PM 05/21/2024   11:13 AM  Depression screen PHQ 2/9  Decreased Interest 0 0 1  Down, Depressed, Hopeless 0 0  0  PHQ - 2 Score 0 0 1  Altered sleeping 0 0 0  Tired, decreased energy 0 0 1  Change in appetite 0 0 0  Feeling bad or failure about yourself  0 0 0  Trouble concentrating 0 0 1  Moving slowly or fidgety/restless 0 0 0  Suicidal thoughts 0 0 0  PHQ-9 Score 0 0  3   Difficult doing work/chores Not difficult at all Not difficult at all Not difficult at all     Data saved with a previous flowsheet row definition      09/17/2024    9:40 AM 05/21/2024   11:13 AM 02/20/2024    1:54 PM 11/14/2023   10:14 AM  GAD 7 : Generalized Anxiety Score  Nervous, Anxious, on Edge 0 1  0  0   Control/stop worrying 0 0  0  0   Worry too much - different things 0 0  0  0   Trouble relaxing 0 0  0  0   Restless 0 0  0  0   Easily annoyed or irritable 0 1  0  0   Afraid - awful might happen 0 0  0  0   Total GAD 7 Score 0 2 0 0  Anxiety Difficulty Not difficult at all  Not difficult at all Not difficult at all  Data saved with a previous flowsheet row definition      09/17/2024    9:40 AM 06/11/2024    3:58 PM 05/21/2024   11:13 AM  Depression screen PHQ 2/9  Decreased Interest 0 0 1  Down, Depressed, Hopeless 0 0 0  PHQ - 2 Score 0 0 1  Altered sleeping 0 0 0  Tired, decreased energy 0 0 1  Change in appetite 0 0 0  Feeling bad or failure about yourself  0 0 0  Trouble concentrating 0 0 1  Moving slowly or fidgety/restless 0 0 0  Suicidal thoughts 0 0 0  PHQ-9 Score 0 0  3   Difficult doing work/chores Not difficult at all Not difficult at all Not difficult at all     Data saved with a previous flowsheet row definition      09/17/2024    9:40 AM 05/21/2024   11:13 AM 02/20/2024    1:54 PM 11/14/2023   10:14 AM  GAD 7 : Generalized Anxiety Score  Nervous, Anxious, on Edge 0 1  0  0   Control/stop worrying 0 0  0  0   Worry too much - different things 0 0  0  0   Trouble relaxing 0 0  0  0   Restless 0 0  0  0   Easily annoyed or irritable 0 1  0  0   Afraid - awful might happen 0  0  0  0   Total GAD 7 Score 0 2 0 0  Anxiety Difficulty Not difficult at all  Not difficult at all Not difficult at all     Data saved with a previous flowsheet row definition   SDOH Screenings   Food Insecurity: No Food Insecurity (06/11/2024)  Housing: Unknown (06/11/2024)  Transportation Needs: No Transportation Needs (06/11/2024)  Utilities: Not At Risk (06/11/2024)  Alcohol Screen: Low Risk (06/11/2024)  Depression (PHQ2-9): Low Risk (09/17/2024)  Financial Resource Strain: Low Risk (06/11/2024)  Physical Activity: Inactive (06/11/2024)  Social Connections: Moderately Isolated (06/11/2024)  Stress: No Stress Concern Present (06/11/2024)  Tobacco Use: Medium Risk (09/17/2024)  Health Literacy: Adequate Health Literacy (06/11/2024)     Physical Exam Constitutional:      General: She is not in acute distress.    Appearance: Normal appearance.  HENT:     Head: Normocephalic and atraumatic.     Right Ear: Tympanic membrane normal.     Left Ear: Tympanic membrane normal.     Mouth/Throat:     Mouth: Mucous membranes are moist.  Neck:     Thyroid : No thyroid  mass or thyroid  tenderness.  Cardiovascular:     Rate and Rhythm: Normal rate and regular rhythm.  Pulmonary:     Effort: Pulmonary effort is normal.     Breath sounds: Normal breath sounds.  Abdominal:     General: Bowel sounds are normal.     Palpations: Abdomen is soft.     Tenderness: There is no abdominal tenderness. There is no guarding.  Musculoskeletal:     Cervical back: Neck supple. No rigidity.     Right lower leg: No edema.     Left lower leg: No edema.  Skin:    General: Skin is warm.     Comments: Multiple healing skin lesions on face without sign of infection noted.  Neurological:     Mental Status: She is alert and oriented to person, place, and time.     Motor: No  weakness.  Psychiatric:        Mood and Affect: Mood normal.        Behavior: Behavior normal.        No results found for  any visits on 09/17/24.  The ASCVD Risk score (Arnett DK, et al., 2019) failed to calculate for the following reasons:   The 2019 ASCVD risk score is only valid for ages 100 to 2   Risk score cannot be calculated because patient has a medical history suggesting prior/existing ASCVD   * - Cholesterol units were assumed     Assessment & Plan:   Assessment & Plan Meningioma (HCC) MRI brain, 02/23/2021: 7 mm partially calcified meningioma overlying the right parietal lobe. Patient asymptomatic. Recommend repeat MRI for follow up, ordered. If change in size will refer her to neurosurgery for further evaluation and management. Advised to report any new symptoms such as vision changes, headaches, or nausea. Orders:   MR Brain Wo Contrast; Future  Prediabetes Continue intermittent monitoring blood glucose levels.  No threshold for progression into diabetes.  Continue healthy lifestyle modification (low carbohydrate, sugar, processed food).  Orders:   HgB A1c; Future  Mixed hyperlipidemia LDL 180 mg/dL, triglycerides slightly elevated, previous intolerance to statins, Zetia, Repatha . Patient accepts risks including MI, stroke due to medication intolerance. Continue intermittent monitoring cholesterol levels. Advised dietary modifications to reduce fats and sugars. Orders:   Lipid panel; Future  Medication management History of GERD managed with Protonix  40 mg daily.  Continue.  Will check B12 in the future. Orders:   B12; Future  Lesion of skin of face Continue Bactroban  2%, Aquaphor as recommended by dermatologist.  Advised wearing gloves to prevent touching the lesion.    Mood disorder Chronic, anxiety predominant.  No si/hi.  Continue Celexa  20 mg daily and  trazodone  50 mg nightly to help with sleep.       Return in about 6 months (around 03/17/2025) for chronic follow up, fasting labs 2 days before apt.   Luke Shade, MD "

## 2025-04-01 ENCOUNTER — Ambulatory Visit

## 2025-06-16 ENCOUNTER — Ambulatory Visit
